# Patient Record
Sex: Female | Born: 1975 | Race: White | Hispanic: No | Smoking: Former smoker
Health system: Southern US, Community
[De-identification: ages and names within clinical notes are randomized; demographics above are authoritative.]

## PROBLEM LIST (undated history)

## (undated) DIAGNOSIS — F419 Anxiety disorder, unspecified: Secondary | ICD-10-CM

## (undated) DIAGNOSIS — E7800 Pure hypercholesterolemia, unspecified: Secondary | ICD-10-CM

## (undated) DIAGNOSIS — F32A Depression, unspecified: Secondary | ICD-10-CM

## (undated) DIAGNOSIS — L853 Xerosis cutis: Secondary | ICD-10-CM

## (undated) DIAGNOSIS — F319 Bipolar disorder, unspecified: Secondary | ICD-10-CM

## (undated) DIAGNOSIS — N3941 Urge incontinence: Secondary | ICD-10-CM

## (undated) DIAGNOSIS — E559 Vitamin D deficiency, unspecified: Secondary | ICD-10-CM

## (undated) DIAGNOSIS — Z973 Presence of spectacles and contact lenses: Secondary | ICD-10-CM

## (undated) DIAGNOSIS — R3914 Feeling of incomplete bladder emptying: Secondary | ICD-10-CM

## (undated) DIAGNOSIS — R351 Nocturia: Secondary | ICD-10-CM

## (undated) DIAGNOSIS — F329 Major depressive disorder, single episode, unspecified: Secondary | ICD-10-CM

## (undated) DIAGNOSIS — K219 Gastro-esophageal reflux disease without esophagitis: Secondary | ICD-10-CM

## (undated) DIAGNOSIS — G4730 Sleep apnea, unspecified: Secondary | ICD-10-CM

## (undated) HISTORY — DX: Anxiety disorder, unspecified: F41.9

## (undated) HISTORY — DX: Pure hypercholesterolemia, unspecified: E78.00

## (undated) HISTORY — DX: Sleep apnea, unspecified: G47.30

## (undated) HISTORY — PX: WISDOM TOOTH EXTRACTION: SHX21

## (undated) HISTORY — DX: Vitamin D deficiency, unspecified: E55.9

---

## 1998-06-20 ENCOUNTER — Emergency Department (HOSPITAL_COMMUNITY): Admission: EM | Admit: 1998-06-20 | Discharge: 1998-06-20 | Payer: Self-pay | Admitting: Emergency Medicine

## 1998-06-20 ENCOUNTER — Encounter: Payer: Self-pay | Admitting: Emergency Medicine

## 1998-12-12 ENCOUNTER — Other Ambulatory Visit: Admission: RE | Admit: 1998-12-12 | Discharge: 1998-12-12 | Payer: Self-pay | Admitting: Family Medicine

## 1999-05-25 ENCOUNTER — Other Ambulatory Visit: Admission: RE | Admit: 1999-05-25 | Discharge: 1999-05-25 | Payer: Self-pay | Admitting: Family Medicine

## 2000-11-04 ENCOUNTER — Other Ambulatory Visit: Admission: RE | Admit: 2000-11-04 | Discharge: 2000-11-04 | Payer: Self-pay | Admitting: *Deleted

## 2001-11-10 ENCOUNTER — Other Ambulatory Visit: Admission: RE | Admit: 2001-11-10 | Discharge: 2001-11-10 | Payer: Self-pay | Admitting: Obstetrics and Gynecology

## 2002-01-22 ENCOUNTER — Emergency Department (HOSPITAL_COMMUNITY): Admission: EM | Admit: 2002-01-22 | Discharge: 2002-01-22 | Payer: Self-pay | Admitting: Emergency Medicine

## 2002-04-20 ENCOUNTER — Encounter: Payer: Self-pay | Admitting: Obstetrics & Gynecology

## 2002-04-20 ENCOUNTER — Ambulatory Visit (HOSPITAL_COMMUNITY): Admission: RE | Admit: 2002-04-20 | Discharge: 2002-04-20 | Payer: Self-pay | Admitting: Obstetrics & Gynecology

## 2002-04-24 ENCOUNTER — Inpatient Hospital Stay (HOSPITAL_COMMUNITY): Admission: AD | Admit: 2002-04-24 | Discharge: 2002-04-24 | Payer: Self-pay | Admitting: *Deleted

## 2002-07-12 ENCOUNTER — Ambulatory Visit (HOSPITAL_COMMUNITY): Admission: RE | Admit: 2002-07-12 | Discharge: 2002-07-12 | Payer: Self-pay | Admitting: Obstetrics & Gynecology

## 2002-07-12 ENCOUNTER — Encounter: Payer: Self-pay | Admitting: Obstetrics & Gynecology

## 2002-08-27 ENCOUNTER — Encounter: Payer: Self-pay | Admitting: Obstetrics & Gynecology

## 2002-08-27 ENCOUNTER — Ambulatory Visit (HOSPITAL_COMMUNITY): Admission: RE | Admit: 2002-08-27 | Discharge: 2002-08-27 | Payer: Self-pay | Admitting: Obstetrics & Gynecology

## 2002-09-03 ENCOUNTER — Inpatient Hospital Stay (HOSPITAL_COMMUNITY): Admission: AD | Admit: 2002-09-03 | Discharge: 2002-09-03 | Payer: Self-pay | Admitting: Obstetrics

## 2002-10-17 ENCOUNTER — Inpatient Hospital Stay (HOSPITAL_COMMUNITY): Admission: AD | Admit: 2002-10-17 | Discharge: 2002-10-17 | Payer: Self-pay | Admitting: Certified Nurse Midwife

## 2002-10-23 ENCOUNTER — Inpatient Hospital Stay (HOSPITAL_COMMUNITY): Admission: AD | Admit: 2002-10-23 | Discharge: 2002-10-23 | Payer: Self-pay | Admitting: Obstetrics & Gynecology

## 2002-10-27 ENCOUNTER — Inpatient Hospital Stay (HOSPITAL_COMMUNITY): Admission: AD | Admit: 2002-10-27 | Discharge: 2002-10-27 | Payer: Self-pay | Admitting: Obstetrics

## 2002-10-28 ENCOUNTER — Encounter: Payer: Self-pay | Admitting: Obstetrics

## 2002-10-28 ENCOUNTER — Inpatient Hospital Stay (HOSPITAL_COMMUNITY): Admission: AD | Admit: 2002-10-28 | Discharge: 2002-10-28 | Payer: Self-pay | Admitting: Obstetrics

## 2002-11-13 ENCOUNTER — Inpatient Hospital Stay (HOSPITAL_COMMUNITY): Admission: AD | Admit: 2002-11-13 | Discharge: 2002-11-13 | Payer: Self-pay | Admitting: Obstetrics & Gynecology

## 2002-11-17 ENCOUNTER — Inpatient Hospital Stay (HOSPITAL_COMMUNITY): Admission: AD | Admit: 2002-11-17 | Discharge: 2002-11-17 | Payer: Self-pay | Admitting: Obstetrics

## 2002-11-26 ENCOUNTER — Encounter: Payer: Self-pay | Admitting: Obstetrics & Gynecology

## 2002-11-26 ENCOUNTER — Inpatient Hospital Stay (HOSPITAL_COMMUNITY): Admission: AD | Admit: 2002-11-26 | Discharge: 2002-11-26 | Payer: Self-pay | Admitting: Obstetrics & Gynecology

## 2002-11-30 ENCOUNTER — Inpatient Hospital Stay (HOSPITAL_COMMUNITY): Admission: AD | Admit: 2002-11-30 | Discharge: 2002-12-03 | Payer: Self-pay | Admitting: Obstetrics & Gynecology

## 2004-05-12 ENCOUNTER — Ambulatory Visit: Payer: Self-pay | Admitting: Family Medicine

## 2004-06-11 ENCOUNTER — Ambulatory Visit: Payer: Self-pay | Admitting: Family Medicine

## 2004-07-15 ENCOUNTER — Ambulatory Visit: Payer: Self-pay | Admitting: Family Medicine

## 2004-07-16 ENCOUNTER — Ambulatory Visit: Payer: Self-pay | Admitting: Family Medicine

## 2004-08-05 ENCOUNTER — Ambulatory Visit: Payer: Self-pay | Admitting: Family Medicine

## 2004-10-07 ENCOUNTER — Ambulatory Visit: Payer: Self-pay | Admitting: Family Medicine

## 2004-10-21 ENCOUNTER — Ambulatory Visit: Payer: Self-pay | Admitting: Family Medicine

## 2005-01-11 ENCOUNTER — Ambulatory Visit: Payer: Self-pay | Admitting: Family Medicine

## 2005-04-08 ENCOUNTER — Ambulatory Visit: Payer: Self-pay | Admitting: Family Medicine

## 2005-07-21 ENCOUNTER — Ambulatory Visit: Payer: Self-pay | Admitting: Family Medicine

## 2006-01-05 ENCOUNTER — Encounter (INDEPENDENT_AMBULATORY_CARE_PROVIDER_SITE_OTHER): Payer: Self-pay | Admitting: *Deleted

## 2006-01-05 LAB — CONVERTED CEMR LAB

## 2006-01-28 ENCOUNTER — Ambulatory Visit: Payer: Self-pay | Admitting: Family Medicine

## 2006-02-03 ENCOUNTER — Ambulatory Visit: Payer: Self-pay | Admitting: Family Medicine

## 2006-06-22 ENCOUNTER — Ambulatory Visit: Payer: Self-pay | Admitting: Sports Medicine

## 2006-08-04 DIAGNOSIS — F319 Bipolar disorder, unspecified: Secondary | ICD-10-CM | POA: Insufficient documentation

## 2006-08-04 DIAGNOSIS — L219 Seborrheic dermatitis, unspecified: Secondary | ICD-10-CM | POA: Insufficient documentation

## 2006-08-05 ENCOUNTER — Encounter (INDEPENDENT_AMBULATORY_CARE_PROVIDER_SITE_OTHER): Payer: Self-pay | Admitting: *Deleted

## 2006-08-25 ENCOUNTER — Telehealth: Payer: Self-pay | Admitting: *Deleted

## 2006-11-10 ENCOUNTER — Telehealth: Payer: Self-pay | Admitting: *Deleted

## 2006-11-11 ENCOUNTER — Ambulatory Visit: Payer: Self-pay | Admitting: Sports Medicine

## 2006-11-11 ENCOUNTER — Encounter (INDEPENDENT_AMBULATORY_CARE_PROVIDER_SITE_OTHER): Payer: Self-pay | Admitting: Family Medicine

## 2006-11-11 LAB — CONVERTED CEMR LAB
Beta hcg, urine, semiquantitative: NEGATIVE
Bilirubin Urine: NEGATIVE
Blood in Urine, dipstick: NEGATIVE
CO2: 22 meq/L (ref 19–32)
Chloride: 105 meq/L (ref 96–112)
Creatinine, Ser: 0.89 mg/dL (ref 0.40–1.20)
Glucose, Urine, Semiquant: NEGATIVE
Nitrite: NEGATIVE
Protein, U semiquant: NEGATIVE
Specific Gravity, Urine: 1.02
Urobilinogen, UA: 0.2
WBC, UA: >20 cells/hpf
Whiff Test: NEGATIVE
pH: 5.5

## 2006-11-25 ENCOUNTER — Ambulatory Visit: Payer: Self-pay | Admitting: Family Medicine

## 2006-11-25 DIAGNOSIS — N3946 Mixed incontinence: Secondary | ICD-10-CM

## 2006-11-25 LAB — CONVERTED CEMR LAB
Ketones, urine, test strip: NEGATIVE
Nitrite: NEGATIVE
Urobilinogen, UA: 0.2

## 2006-11-29 ENCOUNTER — Telehealth: Payer: Self-pay | Admitting: *Deleted

## 2006-12-05 ENCOUNTER — Telehealth: Payer: Self-pay | Admitting: *Deleted

## 2006-12-07 ENCOUNTER — Ambulatory Visit: Payer: Self-pay | Admitting: Sports Medicine

## 2006-12-07 LAB — CONVERTED CEMR LAB
Bilirubin Urine: NEGATIVE
Glucose, Urine, Semiquant: NEGATIVE
Protein, U semiquant: NEGATIVE
pH: 6.5

## 2007-01-03 ENCOUNTER — Other Ambulatory Visit: Admission: RE | Admit: 2007-01-03 | Discharge: 2007-01-03 | Payer: Self-pay | Admitting: Family Medicine

## 2007-01-03 ENCOUNTER — Encounter (INDEPENDENT_AMBULATORY_CARE_PROVIDER_SITE_OTHER): Payer: Self-pay | Admitting: Family Medicine

## 2007-01-03 ENCOUNTER — Ambulatory Visit: Payer: Self-pay | Admitting: Family Medicine

## 2007-01-03 ENCOUNTER — Telehealth (INDEPENDENT_AMBULATORY_CARE_PROVIDER_SITE_OTHER): Payer: Self-pay | Admitting: *Deleted

## 2007-01-03 DIAGNOSIS — E785 Hyperlipidemia, unspecified: Secondary | ICD-10-CM | POA: Insufficient documentation

## 2007-01-09 ENCOUNTER — Telehealth: Payer: Self-pay | Admitting: *Deleted

## 2007-01-16 LAB — CONVERTED CEMR LAB
Chlamydia, DNA Probe: NEGATIVE
Cholesterol: 228 mg/dL — ABNORMAL HIGH (ref 0–200)
GC Probe Amp, Genital: NEGATIVE
HDL: 46 mg/dL (ref >39)
Triglycerides: 183 mg/dL — ABNORMAL HIGH (ref <150)

## 2007-01-17 ENCOUNTER — Telehealth (INDEPENDENT_AMBULATORY_CARE_PROVIDER_SITE_OTHER): Payer: Self-pay | Admitting: *Deleted

## 2007-01-17 ENCOUNTER — Ambulatory Visit: Payer: Self-pay | Admitting: Family Medicine

## 2007-01-18 ENCOUNTER — Telehealth: Payer: Self-pay | Admitting: *Deleted

## 2007-02-08 ENCOUNTER — Telehealth (INDEPENDENT_AMBULATORY_CARE_PROVIDER_SITE_OTHER): Payer: Self-pay | Admitting: *Deleted

## 2007-02-22 ENCOUNTER — Ambulatory Visit: Payer: Self-pay | Admitting: Family Medicine

## 2007-02-22 DIAGNOSIS — E669 Obesity, unspecified: Secondary | ICD-10-CM | POA: Insufficient documentation

## 2007-02-22 LAB — CONVERTED CEMR LAB
Nitrite: NEGATIVE
pH: 6

## 2007-03-30 ENCOUNTER — Telehealth (INDEPENDENT_AMBULATORY_CARE_PROVIDER_SITE_OTHER): Payer: Self-pay | Admitting: Family Medicine

## 2007-04-06 ENCOUNTER — Ambulatory Visit: Payer: Self-pay | Admitting: Family Medicine

## 2007-04-06 LAB — CONVERTED CEMR LAB
Bilirubin Urine: NEGATIVE
Ketones, urine, test strip: NEGATIVE
Nitrite: NEGATIVE
Specific Gravity, Urine: 1.015

## 2007-05-23 ENCOUNTER — Encounter (INDEPENDENT_AMBULATORY_CARE_PROVIDER_SITE_OTHER): Payer: Self-pay | Admitting: Family Medicine

## 2007-07-03 ENCOUNTER — Ambulatory Visit: Payer: Self-pay | Admitting: Family Medicine

## 2007-07-04 ENCOUNTER — Telehealth: Payer: Self-pay | Admitting: Family Medicine

## 2007-07-14 ENCOUNTER — Telehealth (INDEPENDENT_AMBULATORY_CARE_PROVIDER_SITE_OTHER): Payer: Self-pay | Admitting: Family Medicine

## 2007-07-17 ENCOUNTER — Encounter: Payer: Self-pay | Admitting: *Deleted

## 2007-07-25 ENCOUNTER — Ambulatory Visit: Payer: Self-pay | Admitting: Family Medicine

## 2007-07-28 ENCOUNTER — Telehealth (INDEPENDENT_AMBULATORY_CARE_PROVIDER_SITE_OTHER): Payer: Self-pay | Admitting: Family Medicine

## 2007-07-31 ENCOUNTER — Telehealth: Payer: Self-pay | Admitting: *Deleted

## 2007-08-29 ENCOUNTER — Encounter (INDEPENDENT_AMBULATORY_CARE_PROVIDER_SITE_OTHER): Payer: Self-pay | Admitting: Family Medicine

## 2007-08-29 ENCOUNTER — Telehealth (INDEPENDENT_AMBULATORY_CARE_PROVIDER_SITE_OTHER): Payer: Self-pay | Admitting: Family Medicine

## 2007-09-04 ENCOUNTER — Encounter (INDEPENDENT_AMBULATORY_CARE_PROVIDER_SITE_OTHER): Payer: Self-pay | Admitting: Family Medicine

## 2007-09-20 ENCOUNTER — Telehealth (INDEPENDENT_AMBULATORY_CARE_PROVIDER_SITE_OTHER): Payer: Self-pay | Admitting: *Deleted

## 2007-09-21 ENCOUNTER — Telehealth: Payer: Self-pay | Admitting: Psychology

## 2007-09-22 ENCOUNTER — Telehealth: Payer: Self-pay | Admitting: Psychology

## 2007-09-29 ENCOUNTER — Telehealth: Payer: Self-pay | Admitting: *Deleted

## 2007-09-29 ENCOUNTER — Emergency Department (HOSPITAL_COMMUNITY): Admission: EM | Admit: 2007-09-29 | Discharge: 2007-09-29 | Payer: Self-pay | Admitting: Family Medicine

## 2007-10-10 ENCOUNTER — Ambulatory Visit: Payer: Self-pay | Admitting: Family Medicine

## 2007-10-10 DIAGNOSIS — M545 Low back pain: Secondary | ICD-10-CM

## 2007-10-10 DIAGNOSIS — B009 Herpesviral infection, unspecified: Secondary | ICD-10-CM | POA: Insufficient documentation

## 2007-10-10 DIAGNOSIS — M549 Dorsalgia, unspecified: Secondary | ICD-10-CM | POA: Insufficient documentation

## 2007-10-10 HISTORY — DX: Herpesviral infection, unspecified: B00.9

## 2007-11-06 ENCOUNTER — Telehealth: Payer: Self-pay | Admitting: *Deleted

## 2007-11-16 ENCOUNTER — Ambulatory Visit: Payer: Self-pay | Admitting: Family Medicine

## 2007-11-16 ENCOUNTER — Encounter (INDEPENDENT_AMBULATORY_CARE_PROVIDER_SITE_OTHER): Payer: Self-pay | Admitting: Family Medicine

## 2007-11-17 ENCOUNTER — Telehealth (INDEPENDENT_AMBULATORY_CARE_PROVIDER_SITE_OTHER): Payer: Self-pay | Admitting: *Deleted

## 2007-11-29 ENCOUNTER — Telehealth (INDEPENDENT_AMBULATORY_CARE_PROVIDER_SITE_OTHER): Payer: Self-pay | Admitting: *Deleted

## 2007-11-29 LAB — CONVERTED CEMR LAB
FSH: 6.5 milliintl units/mL
Prolactin: 4.8 ng/mL
TSH: 1.757 microintl units/mL (ref 0.350–5.50)
hCG, Beta Chain, Quant, S: <2 milliintl units/mL

## 2007-12-04 ENCOUNTER — Telehealth (INDEPENDENT_AMBULATORY_CARE_PROVIDER_SITE_OTHER): Payer: Self-pay | Admitting: Family Medicine

## 2007-12-06 ENCOUNTER — Telehealth: Payer: Self-pay | Admitting: Family Medicine

## 2007-12-07 ENCOUNTER — Telehealth: Payer: Self-pay | Admitting: *Deleted

## 2007-12-07 ENCOUNTER — Ambulatory Visit: Payer: Self-pay | Admitting: Family Medicine

## 2007-12-07 ENCOUNTER — Telehealth: Payer: Self-pay | Admitting: Family Medicine

## 2007-12-19 ENCOUNTER — Ambulatory Visit: Payer: Self-pay | Admitting: Family Medicine

## 2007-12-19 ENCOUNTER — Encounter (INDEPENDENT_AMBULATORY_CARE_PROVIDER_SITE_OTHER): Payer: Self-pay | Admitting: Family Medicine

## 2007-12-20 LAB — CONVERTED CEMR LAB
Estradiol: 16.7 pg/mL
HCT: 45.2 % (ref 36.0–46.0)
Hemoglobin: 13.9 g/dL (ref 12.0–15.0)
MCHC: 30.8 g/dL (ref 30.0–36.0)
MCV: 93.2 fL (ref 78.0–100.0)
RDW: 13.3 % (ref 11.5–15.5)
TIBC: 306 ug/dL (ref 250–470)

## 2007-12-21 ENCOUNTER — Telehealth: Payer: Self-pay | Admitting: *Deleted

## 2007-12-22 ENCOUNTER — Ambulatory Visit: Payer: Self-pay | Admitting: Family Medicine

## 2008-01-01 ENCOUNTER — Telehealth (INDEPENDENT_AMBULATORY_CARE_PROVIDER_SITE_OTHER): Payer: Self-pay | Admitting: Family Medicine

## 2008-01-04 ENCOUNTER — Telehealth: Payer: Self-pay | Admitting: *Deleted

## 2008-01-08 ENCOUNTER — Ambulatory Visit: Payer: Self-pay | Admitting: Family Medicine

## 2008-01-09 ENCOUNTER — Encounter (INDEPENDENT_AMBULATORY_CARE_PROVIDER_SITE_OTHER): Payer: Self-pay | Admitting: Family Medicine

## 2008-01-09 ENCOUNTER — Telehealth (INDEPENDENT_AMBULATORY_CARE_PROVIDER_SITE_OTHER): Payer: Self-pay | Admitting: *Deleted

## 2008-01-14 ENCOUNTER — Telehealth (INDEPENDENT_AMBULATORY_CARE_PROVIDER_SITE_OTHER): Payer: Self-pay | Admitting: Family Medicine

## 2008-01-16 ENCOUNTER — Telehealth (INDEPENDENT_AMBULATORY_CARE_PROVIDER_SITE_OTHER): Payer: Self-pay | Admitting: *Deleted

## 2008-01-22 ENCOUNTER — Encounter: Payer: Self-pay | Admitting: *Deleted

## 2008-01-24 ENCOUNTER — Telehealth (INDEPENDENT_AMBULATORY_CARE_PROVIDER_SITE_OTHER): Payer: Self-pay | Admitting: Family Medicine

## 2008-02-27 ENCOUNTER — Telehealth (INDEPENDENT_AMBULATORY_CARE_PROVIDER_SITE_OTHER): Payer: Self-pay | Admitting: *Deleted

## 2008-02-29 ENCOUNTER — Ambulatory Visit: Payer: Self-pay | Admitting: Obstetrics & Gynecology

## 2008-02-29 ENCOUNTER — Encounter (INDEPENDENT_AMBULATORY_CARE_PROVIDER_SITE_OTHER): Payer: Self-pay | Admitting: Family Medicine

## 2008-03-04 ENCOUNTER — Telehealth: Payer: Self-pay | Admitting: *Deleted

## 2008-03-07 ENCOUNTER — Telehealth: Payer: Self-pay | Admitting: Psychology

## 2008-03-11 ENCOUNTER — Telehealth: Payer: Self-pay | Admitting: *Deleted

## 2008-03-12 ENCOUNTER — Telehealth: Payer: Self-pay | Admitting: *Deleted

## 2008-03-22 ENCOUNTER — Ambulatory Visit: Payer: Self-pay | Admitting: Family Medicine

## 2008-03-27 ENCOUNTER — Telehealth (INDEPENDENT_AMBULATORY_CARE_PROVIDER_SITE_OTHER): Payer: Self-pay | Admitting: *Deleted

## 2008-03-27 ENCOUNTER — Telehealth: Payer: Self-pay | Admitting: *Deleted

## 2008-04-08 ENCOUNTER — Encounter (INDEPENDENT_AMBULATORY_CARE_PROVIDER_SITE_OTHER): Payer: Self-pay | Admitting: *Deleted

## 2008-04-08 ENCOUNTER — Encounter: Payer: Self-pay | Admitting: *Deleted

## 2008-05-28 ENCOUNTER — Telehealth: Payer: Self-pay | Admitting: *Deleted

## 2008-06-14 ENCOUNTER — Ambulatory Visit: Payer: Self-pay | Admitting: Family Medicine

## 2008-06-24 ENCOUNTER — Telehealth (INDEPENDENT_AMBULATORY_CARE_PROVIDER_SITE_OTHER): Payer: Self-pay | Admitting: *Deleted

## 2008-07-02 ENCOUNTER — Encounter (INDEPENDENT_AMBULATORY_CARE_PROVIDER_SITE_OTHER): Payer: Self-pay | Admitting: Family Medicine

## 2008-07-02 ENCOUNTER — Ambulatory Visit: Payer: Self-pay | Admitting: Family Medicine

## 2008-07-02 LAB — CONVERTED CEMR LAB
Chlamydia, DNA Probe: NEGATIVE
GC Probe Amp, Genital: NEGATIVE
Whiff Test: NEGATIVE

## 2008-07-03 ENCOUNTER — Encounter (INDEPENDENT_AMBULATORY_CARE_PROVIDER_SITE_OTHER): Payer: Self-pay | Admitting: Family Medicine

## 2008-08-20 ENCOUNTER — Telehealth (INDEPENDENT_AMBULATORY_CARE_PROVIDER_SITE_OTHER): Payer: Self-pay | Admitting: Family Medicine

## 2008-11-12 ENCOUNTER — Encounter (INDEPENDENT_AMBULATORY_CARE_PROVIDER_SITE_OTHER): Payer: Self-pay | Admitting: Family Medicine

## 2008-11-14 ENCOUNTER — Telehealth: Payer: Self-pay | Admitting: Family Medicine

## 2008-12-04 ENCOUNTER — Ambulatory Visit: Payer: Self-pay | Admitting: Family Medicine

## 2008-12-08 ENCOUNTER — Telehealth: Payer: Self-pay | Admitting: Family Medicine

## 2008-12-09 ENCOUNTER — Telehealth: Payer: Self-pay | Admitting: Family Medicine

## 2008-12-10 ENCOUNTER — Telehealth: Payer: Self-pay | Admitting: Family Medicine

## 2008-12-11 ENCOUNTER — Encounter: Payer: Self-pay | Admitting: Family Medicine

## 2008-12-11 ENCOUNTER — Ambulatory Visit: Payer: Self-pay | Admitting: Family Medicine

## 2008-12-11 DIAGNOSIS — G4700 Insomnia, unspecified: Secondary | ICD-10-CM

## 2008-12-11 LAB — CONVERTED CEMR LAB
Antibody Screen: NEGATIVE
Basophils Relative: 0 % (ref 0–1)
Eosinophils Absolute: 0.3 10*3/uL (ref 0.0–0.7)
Eosinophils Relative: 2 % (ref 0–5)
HCT: 39.7 % (ref 36.0–46.0)
MCHC: 32.7 g/dL (ref 30.0–36.0)
MCV: 88.2 fL (ref 78.0–100.0)
Neutrophils Relative %: 73 % (ref 43–77)
Platelets: 268 10*3/uL (ref 150–400)
RDW: 12.9 % (ref 11.5–15.5)
Rubella: 97.4 intl units/mL — ABNORMAL HIGH
Sickle Cell Screen: NEGATIVE

## 2008-12-12 ENCOUNTER — Telehealth: Payer: Self-pay | Admitting: Family Medicine

## 2008-12-12 ENCOUNTER — Ambulatory Visit (HOSPITAL_COMMUNITY): Admission: RE | Admit: 2008-12-12 | Discharge: 2008-12-12 | Payer: Self-pay | Admitting: Internal Medicine

## 2008-12-12 ENCOUNTER — Encounter: Payer: Self-pay | Admitting: Family Medicine

## 2008-12-12 DIAGNOSIS — O09A0 Supervision of pregnancy with history of molar pregnancy, unspecified trimester: Secondary | ICD-10-CM | POA: Insufficient documentation

## 2008-12-12 DIAGNOSIS — O0289 Other abnormal products of conception: Secondary | ICD-10-CM

## 2008-12-12 HISTORY — DX: Supervision of pregnancy with history of molar pregnancy, unspecified trimester: O09.A0

## 2008-12-13 ENCOUNTER — Encounter: Payer: Self-pay | Admitting: Family Medicine

## 2008-12-13 ENCOUNTER — Ambulatory Visit: Payer: Self-pay | Admitting: Family Medicine

## 2008-12-13 ENCOUNTER — Telehealth: Payer: Self-pay | Admitting: Family Medicine

## 2008-12-16 ENCOUNTER — Encounter: Payer: Self-pay | Admitting: Family Medicine

## 2008-12-16 ENCOUNTER — Telehealth: Payer: Self-pay | Admitting: Family Medicine

## 2008-12-16 ENCOUNTER — Ambulatory Visit: Payer: Self-pay | Admitting: Family Medicine

## 2008-12-17 LAB — CONVERTED CEMR LAB: hCG, Beta Chain, Quant, S: 61701.4 milliintl units/mL

## 2008-12-19 ENCOUNTER — Encounter: Payer: Self-pay | Admitting: Family Medicine

## 2008-12-19 ENCOUNTER — Telehealth: Payer: Self-pay | Admitting: Family Medicine

## 2008-12-19 ENCOUNTER — Ambulatory Visit (HOSPITAL_COMMUNITY): Admission: RE | Admit: 2008-12-19 | Discharge: 2008-12-19 | Payer: Self-pay | Admitting: Family Medicine

## 2008-12-20 ENCOUNTER — Telehealth: Payer: Self-pay | Admitting: Family Medicine

## 2008-12-20 ENCOUNTER — Encounter: Payer: Self-pay | Admitting: Family Medicine

## 2008-12-23 ENCOUNTER — Telehealth: Payer: Self-pay | Admitting: Family Medicine

## 2008-12-23 ENCOUNTER — Encounter: Payer: Self-pay | Admitting: Family Medicine

## 2008-12-25 ENCOUNTER — Ambulatory Visit: Payer: Self-pay | Admitting: Obstetrics & Gynecology

## 2008-12-26 ENCOUNTER — Ambulatory Visit (HOSPITAL_COMMUNITY): Admission: RE | Admit: 2008-12-26 | Discharge: 2008-12-26 | Payer: Self-pay | Admitting: Obstetrics & Gynecology

## 2008-12-26 ENCOUNTER — Encounter: Payer: Self-pay | Admitting: Obstetrics & Gynecology

## 2008-12-26 ENCOUNTER — Ambulatory Visit: Payer: Self-pay | Admitting: Obstetrics & Gynecology

## 2008-12-26 HISTORY — PX: DILATION AND EVACUATION: SHX1459

## 2009-01-01 ENCOUNTER — Ambulatory Visit: Payer: Self-pay | Admitting: Obstetrics and Gynecology

## 2009-01-01 ENCOUNTER — Encounter (INDEPENDENT_AMBULATORY_CARE_PROVIDER_SITE_OTHER): Payer: Self-pay | Admitting: Obstetrics & Gynecology

## 2009-01-01 LAB — CONVERTED CEMR LAB: hCG, Beta Chain, Quant, S: 295.8 milliintl units/mL

## 2009-01-03 ENCOUNTER — Telehealth: Payer: Self-pay | Admitting: *Deleted

## 2009-01-03 ENCOUNTER — Telehealth: Payer: Self-pay | Admitting: Family Medicine

## 2009-01-08 ENCOUNTER — Ambulatory Visit: Payer: Self-pay | Admitting: Obstetrics & Gynecology

## 2009-01-08 ENCOUNTER — Encounter: Payer: Self-pay | Admitting: Obstetrics & Gynecology

## 2009-01-08 LAB — CONVERTED CEMR LAB: hCG, Beta Chain, Quant, S: 61.2 milliintl units/mL

## 2009-01-16 ENCOUNTER — Ambulatory Visit: Payer: Self-pay | Admitting: Family Medicine

## 2009-01-16 ENCOUNTER — Encounter: Payer: Self-pay | Admitting: Family Medicine

## 2009-01-16 LAB — CONVERTED CEMR LAB
CO2: 28 meq/L (ref 19–32)
Calcium: 9.9 mg/dL (ref 8.4–10.5)
Creatinine, Ser: 0.79 mg/dL (ref 0.40–1.20)
Glucose, Bld: 103 mg/dL — ABNORMAL HIGH (ref 70–99)
HCT: 37.8 % (ref 36.0–46.0)
MCHC: 32.3 g/dL (ref 30.0–36.0)
MCV: 88.1 fL (ref 78.0–100.0)
RBC: 4.29 M/uL (ref 3.87–5.11)
Sodium: 142 meq/L (ref 135–145)
WBC: 7.9 10*3/uL (ref 4.0–10.5)
hCG, Beta Chain, Quant, S: 20.5 milliintl units/mL

## 2009-01-17 ENCOUNTER — Telehealth: Payer: Self-pay | Admitting: Family Medicine

## 2009-01-23 ENCOUNTER — Ambulatory Visit: Payer: Self-pay | Admitting: Family Medicine

## 2009-01-23 DIAGNOSIS — IMO0002 Reserved for concepts with insufficient information to code with codable children: Secondary | ICD-10-CM | POA: Insufficient documentation

## 2009-01-23 DIAGNOSIS — R413 Other amnesia: Secondary | ICD-10-CM | POA: Insufficient documentation

## 2009-01-29 ENCOUNTER — Ambulatory Visit: Payer: Self-pay | Admitting: Obstetrics and Gynecology

## 2009-02-05 ENCOUNTER — Ambulatory Visit: Payer: Self-pay | Admitting: Obstetrics & Gynecology

## 2009-02-05 ENCOUNTER — Encounter: Payer: Self-pay | Admitting: Obstetrics and Gynecology

## 2009-02-12 ENCOUNTER — Ambulatory Visit: Payer: Self-pay | Admitting: Obstetrics and Gynecology

## 2009-03-26 ENCOUNTER — Encounter: Payer: Self-pay | Admitting: Family Medicine

## 2009-03-26 ENCOUNTER — Ambulatory Visit: Payer: Self-pay | Admitting: Family Medicine

## 2009-04-03 ENCOUNTER — Encounter: Payer: Self-pay | Admitting: Family Medicine

## 2009-04-16 ENCOUNTER — Encounter: Payer: Self-pay | Admitting: Family Medicine

## 2009-04-16 ENCOUNTER — Ambulatory Visit: Payer: Self-pay | Admitting: Family Medicine

## 2009-04-23 ENCOUNTER — Telehealth: Payer: Self-pay | Admitting: Family Medicine

## 2009-04-24 ENCOUNTER — Encounter: Payer: Self-pay | Admitting: Family Medicine

## 2009-04-24 ENCOUNTER — Ambulatory Visit: Payer: Self-pay | Admitting: Family Medicine

## 2009-04-24 LAB — CONVERTED CEMR LAB
Bilirubin Urine: NEGATIVE
Blood in Urine, dipstick: NEGATIVE
CO2: 24 meq/L (ref 19–32)
Calcium: 9.4 mg/dL (ref 8.4–10.5)
Chloride: 105 meq/L (ref 96–112)
Glucose, Bld: 69 mg/dL — ABNORMAL LOW (ref 70–99)
Glucose, Urine, Semiquant: NEGATIVE
Ketones, urine, test strip: NEGATIVE
Potassium: 4 meq/L (ref 3.5–5.3)
Protein, U semiquant: NEGATIVE
Sodium: 140 meq/L (ref 135–145)
Urobilinogen, UA: 0.2
pH: 6

## 2009-04-25 ENCOUNTER — Encounter: Payer: Self-pay | Admitting: Family Medicine

## 2009-04-30 ENCOUNTER — Telehealth: Payer: Self-pay | Admitting: Family Medicine

## 2009-05-07 ENCOUNTER — Telehealth: Payer: Self-pay | Admitting: Family Medicine

## 2009-05-09 ENCOUNTER — Encounter: Payer: Self-pay | Admitting: Family Medicine

## 2009-05-12 ENCOUNTER — Telehealth: Payer: Self-pay | Admitting: Family Medicine

## 2009-05-26 ENCOUNTER — Telehealth: Payer: Self-pay | Admitting: Family Medicine

## 2009-05-28 ENCOUNTER — Ambulatory Visit: Payer: Self-pay | Admitting: Family Medicine

## 2009-06-01 ENCOUNTER — Telehealth: Payer: Self-pay | Admitting: Family Medicine

## 2009-06-09 ENCOUNTER — Encounter (INDEPENDENT_AMBULATORY_CARE_PROVIDER_SITE_OTHER): Payer: Self-pay | Admitting: *Deleted

## 2009-06-09 DIAGNOSIS — Z87891 Personal history of nicotine dependence: Secondary | ICD-10-CM

## 2009-06-09 DIAGNOSIS — F17200 Nicotine dependence, unspecified, uncomplicated: Secondary | ICD-10-CM

## 2009-06-09 HISTORY — DX: Personal history of nicotine dependence: Z87.891

## 2009-09-15 ENCOUNTER — Telehealth: Payer: Self-pay | Admitting: Family Medicine

## 2009-11-12 ENCOUNTER — Emergency Department (HOSPITAL_COMMUNITY): Admission: EM | Admit: 2009-11-12 | Discharge: 2009-11-12 | Payer: Self-pay | Admitting: Emergency Medicine

## 2009-11-21 ENCOUNTER — Encounter: Payer: Self-pay | Admitting: Family Medicine

## 2010-02-05 HISTORY — PX: INTRAUTERINE DEVICE (IUD) INSERTION: SHX5877

## 2010-07-07 NOTE — Miscellaneous (Signed)
Summary: Tobacco [employer]  Clinical Lists Changes  Problems: Added new problem of TOBACCO Enrico Eaddy (ICD-305.1) 

## 2010-07-07 NOTE — Progress Notes (Signed)
  Phone Note Call from Patient   Summary of Call: Patient called with question "am I pregnant?" When told that she was pregnant according to her last Upreg, she said, "good, I thought that I was dreaming." She denied any problems including cramping or bleeding. She asked when she would be able to hear the heart beat. I explained that this could be done 10-13 weeks. Advised patient to make and keep OB appointment to go over other questions.

## 2010-07-07 NOTE — Miscellaneous (Signed)
  Clinical Lists Changes  Problems: Removed problem of HYPOTENSION (ICD-458.9) Removed problem of FLANK PAIN (ICD-789.09) Removed problem of INSERTION OF INTRAUTERINE CONTRACEPTIVE DEVICE (ICD-V25.1) Removed problem of FAMILY PLANNING (ICD-V25.09) Removed problem of RECTAL PAIN (YNW-295.62)

## 2010-07-07 NOTE — Progress Notes (Signed)
Summary: Rx Req  Phone Note Refill Request Call back at [phone] Message from:  Patient  Refills Requested: Medication #1:  TRAZODONE HCL 100 MG TABS one tab by mouth at bedtime. Would like this to go to [facility] [address]  Did get it at [facility]. but now has new ins and is changing pharmacies.  Initial call taken by: Clydell Hakim,  September 15, 2009 9:04 AM  Follow-up for Phone Call        will forward to MD. Follow-up by: Theresia Lo RN,  September 15, 2009 9:10 AM    Prescriptions: TRAZODONE HCL 100 MG TABS (TRAZODONE HCL) one tab by mouth at bedtime  #30 x 3   Entered and Authorized by:   Lequita Asal  MD   Signed by:   Lequita Asal  MD on 09/16/2009   Method used:   Electronically to        [facility] [phone]* (retail)       [address]       [place], [place]  [address]       Ph: [phone]       Fax: [phone]   RxID:   [id number]  patient notified. Levonne Carreras RN  September 16, 2009 9:57 AM

## 2010-07-16 ENCOUNTER — Encounter: Payer: Self-pay | Admitting: *Deleted

## 2010-08-24 LAB — POCT I-STAT, CHEM 8
BUN: 12 mg/dL (ref 6–23)
Chloride: 102 mEq/L (ref 96–112)
Creatinine, Ser: 0.9 mg/dL (ref 0.4–1.2)
Glucose, Bld: 101 mg/dL — ABNORMAL HIGH (ref 70–99)
HCT: 35 % — ABNORMAL LOW (ref 36.0–46.0)
Potassium: 3.6 mEq/L (ref 3.5–5.1)

## 2010-09-13 LAB — CROSSMATCH: ABO/RH(D): A POS

## 2010-09-13 LAB — CBC
HCT: 36.7 % (ref 36.0–46.0)
Hemoglobin: 12.6 g/dL (ref 12.0–15.0)
MCV: 89 fL (ref 78.0–100.0)
RDW: 12.8 % (ref 11.5–15.5)

## 2010-10-20 NOTE — Op Note (Signed)
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   PROCEDURE:  Dilation and evacuation.   SURGEON:  Horton Chin, MD   ANESTHESIOLOGIST:  Dana Allan, MD   ANESTHESIA:  Monitored anesthesia care.   INDICATIONS:  The patient is a 35 year old, gravida 2, para 1, who was  followed with serial beta-hCGs and abnormal ultrasound findings.  Her  initial beta-hCG was noted to be 71,000 which went down to 61,000.  However, there was a 3-4 cm cystic structure in the endometrial cavity.  No fetal pole or yolk sac was seen giving concern for abnormal  pregnancy, likely molar pregnancy.  She was counseled regarding a  dilation and evacuation.  The risks of surgery including bleeding,  infection, injury to surrounding organs, need for additional procedures  were explained to the patient and written informed consent was obtained.   FINDINGS:  A 10-week size uterus, moderate amount of products of  conception.   SPECIMENS:  Products of conception sent to pathology.   ESTIMATED BLOOD LOSS:  50 mL.   COMPLICATIONS:  None immediate.   PROCEDURE DETAILS:  The patient was taken to the operating room where  monitored anesthesia care was administered and found to be adequate.  She was then placed in the dorsal lithotomy position, and prepped and  draped in a sterile manner.  The bladder was self catheterized for an  unmeasured amount of clear yellow urine.  After a time-out was  conducted, a vaginal speculum was placed and a  paracervical block using  1% Xylocaine with epinephrine was administered.  The cervix was noted to  be closed and was successfully dilated using metal dilators to  accommodate the 9-mm curved suction curette.  The curette was then  gently inserted into the uterine cavity and advanced to the fundus.  The  suction machine was activated and the curette was slowly rotated to  clear the uterus of all contents.  The uterus was noted to contract down  after evacuation of contents.  Complete evacuation was also verified by  sharp curettage that was done and the patient had minimal bleeding at  the end of the case. All instruments were then removed from the  patient's pelvis.  The patient tolerated the procedure well.  Sponge,  instrument, and needle counts were correct x3.  She was taken to the  recovery room in stable condition.  Of note, the patient will follow up  in the clinic on January 01, 2009 at 2:00 p.m. for a review of the  pathology results and for further management.      Horton Chin, MD  Electronically  Signed     UAA/MEDQ  D:  12/26/2008  T:  12/27/2008  Job:  [id number]

## 2010-10-20 NOTE — Group Therapy Note (Signed)
NAME:  Kimberly Browning, Kimberly Browning NO.:  [id number]   MEDICAL RECORD NO.:  [id number]          PATIENT TYPE:  WOC   LOCATION:  [facility]                   FACILITY:  [facility]   PHYSICIAN:  Dorthula Perfect, MD     DATE OF BIRTH:  Oct 30, 1975   DATE OF SERVICE:                                  CLINIC NOTE   A 35 year old female returns today for followup quantitative hCG.  See  the dictated note of January 01, 2009.  Her quantitative hCG done on January 01, 2009, was 295.8.   She had some very small lesions last time on her lower vulva, which I  did not culture.  I examined her vulva today and those little areas are  completely normal.   IMPRESSION:  1. Status post molar pregnancy with regressing hCG values.  2. I re-explained to her the importance of having the titers done on a      weekly basis until they are normal.  I also explained after that      they can be done monthly for least 6 months.  I also told her she      needs to refrain from getting pregnant for at least 6 months.  She      asked about getting Mirena IUD today, but I told her until these      values are normal and has been normal for a while that this should      not be utilized.  She seems to understand.           ______________________________  Dorthula Perfect, MD     ER/MEDQ  D:  01/08/2009  T:  01/09/2009  Job:  [id number]

## 2010-10-20 NOTE — Group Therapy Note (Signed)
NAME:  Kimberly Browning, Kimberly Browning NO.:  [id number]   MEDICAL RECORD NO.:  [id number]          PATIENT TYPE:  WOC   LOCATION:  [facility]                   [facility]ACILITY:  [facility]   PHYSICIAN:  Dorthula Perfect, MD     DATE OF BIRTH:  1976-03-29   DATE OF SERVICE:  01/01/2009                                  CLINIC NOTE   This 35 year old female returns today for followup having had a D and C  done for a molar pregnancy.  On December 16, 2008, she had a quantitative  HCG of 71,269.  On December 17, 2008, the value was 61,701.  Ultrasound had  shown cystic areas within the uterus.  She underwent a D and C on December 26, 2008.  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  This is her first checkup since then.   She says she is still having some slight bleeding.  She has not had  intercourse.   PHYSICAL EXAMINATION:  VITAL SIGNS:  Height 5 feet 4 inches, weight 126,  blood pressure 109/70.  ABDOMEN:  Soft and nontender.  PELVIC:  External genitalia, BUS and Skene glands are normal.  Vaginal  vault epithelialized.  The cervix is normal.  She is not bleeding.  Uterus is midline, normal size, and shape.  Adnexal structures are  normal.   IMPRESSION:  1. 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  2. History of abnormal Pap smears.   DISPOSITION:  1. Pap smear.  2. Quantitative beta HCG.  3. I explained, in some detail, the importance of having followup      blood tests done until the HCG value has returned to normal.  I      have also explained to her the importance of not having sexual      intercourse.  In the past, she has used birth control pills, which      she had bad reactions to.  I think she has some interest in Mirena      IUD on down the road.  I will try if the result is back tomorrow      when I am here, I will try to call her with the result.  She is to      return to be rechecked in a week.            ______________________________  Dorthula Perfect, MD     ER/MEDQ  D:  01/01/2009  T:  01/02/2009  Job:  [id number]

## 2010-10-20 NOTE — H&P (Signed)
NAME:  Kimberly Browning, Kimberly Browning NO.:  [id number]   MEDICAL RECORD NO.:  [id number]         PATIENT TYPE:  WAMB   LOCATION:                                [facility]   PHYSICIAN:  Allie Bossier, MD        DATE OF BIRTH:  Oct 24, 1975   DATE OF ADMISSION:  12/25/2008  DATE OF DISCHARGE:                              HISTORY & PHYSICAL   HISTORY OF PRESENT ILLNESS:  Kimberly Browning is a 35 year old single, divorced  white gravida 2, para 1, who was seen by [facility] on December 11, 2008 by Dr. Lafonda Mosses.  She was disposed her new OB  visits.  At that time, the patient has no particular complaints, but an  ultrasound was ordered.  The ultrasound showed multiple sac-like cystic  structures within the endometrial cavity which are uncertain etiology.  Differential diagnosis including multiple gestation and gestational  trophoblastic neoplasia.  It was recommended that she should follow up  with quantitative beta.  On the December 13, 2008, her quantitative beta was  71,000.  Three days later, it was down to 61,000.  I have explained to  her that this indicates an abnormal uterine pregnancy that will not  results in the baby.  An ultrasound was then repeated on December 19, 2000,  and again the there is 3-4 cystic structures in the endometrium.  The  largest appearing smaller on this exam than the prior exam.  No fetal  poles or yolk structures seen.  No fetal pulses are seen.  Her blood  type is A positive.   PAST MEDICAL HISTORY:  Bipolar disorder.   MEDICATIONS:  She now takes multivitamin.  Please note that she quit  taking her Pristiq a month ago.   PAST SURGICAL HISTORY:  None.   ALLERGIES:  No known drug allergies.  No latex allergies.   PHYSICAL EXAMINATION:  VITAL SIGNS:  Height 5 feet 4 inches, weight 126  pounds, blood pressure 109/70, and pulse 81.  HEENT:  Normal.  HEART:  Regular rate and rhythm.  LUNGS:  Clear to auscultation bilaterally.   ASSESSMENT AND PLAN:  Abnormal pregnancy with following status, however,  with the risk of gestational trophoblastic neoplasia was highly  recommended D and C, this will be done tomorrow by Dr. Macon Large.      Allie Bossier, MD  Electronically Signed     MCD/MEDQ  D:  12/25/2008  T:  12/26/2008  Job:  [id number]

## 2011-05-03 ENCOUNTER — Encounter (HOSPITAL_BASED_OUTPATIENT_CLINIC_OR_DEPARTMENT_OTHER): Payer: Self-pay | Admitting: *Deleted

## 2011-05-03 NOTE — Pre-Procedure Instructions (Addendum)
To come for BMET, CBC, diff, BP check; to bring overnight bag for [facility] DOS  Pt. slow to respond over the phone to questions asked, had to talk loudly to her, and keep calling her name to get her to respond.  Denies taking any other meds. except what is listed above.  PCP - [facility] - [phone]  Zoe Lan, NP)

## 2011-05-10 ENCOUNTER — Encounter (HOSPITAL_BASED_OUTPATIENT_CLINIC_OR_DEPARTMENT_OTHER)
Admission: RE | Disposition: A | Discharge status: Home / Self Care | Payer: Self-pay | Source: Ambulatory Visit | Attending: Specialist

## 2011-05-10 ENCOUNTER — Encounter (HOSPITAL_BASED_OUTPATIENT_CLINIC_OR_DEPARTMENT_OTHER): Payer: Self-pay

## 2011-05-10 ENCOUNTER — Other Ambulatory Visit: Payer: Self-pay | Admitting: Specialist

## 2011-05-10 ENCOUNTER — Encounter (HOSPITAL_BASED_OUTPATIENT_CLINIC_OR_DEPARTMENT_OTHER): Payer: Self-pay | Admitting: Anesthesiology

## 2011-05-10 ENCOUNTER — Ambulatory Visit (HOSPITAL_BASED_OUTPATIENT_CLINIC_OR_DEPARTMENT_OTHER): Payer: Medicare Other | Admitting: Anesthesiology

## 2011-05-10 ENCOUNTER — Ambulatory Visit (HOSPITAL_BASED_OUTPATIENT_CLINIC_OR_DEPARTMENT_OTHER)
Admission: RE | Admit: 2011-05-10 | Discharge: 2011-05-11 | Disposition: A | Discharge status: Home / Self Care | Payer: Medicare Other | Source: Ambulatory Visit | Attending: Specialist | Admitting: Specialist

## 2011-05-10 ENCOUNTER — Encounter (HOSPITAL_BASED_OUTPATIENT_CLINIC_OR_DEPARTMENT_OTHER): Payer: Self-pay | Admitting: Certified Registered"

## 2011-05-10 DIAGNOSIS — N62 Hypertrophy of breast: Secondary | ICD-10-CM | POA: Insufficient documentation

## 2011-05-10 HISTORY — PX: BREAST REDUCTION SURGERY: SHX8

## 2011-05-10 HISTORY — DX: Gastro-esophageal reflux disease without esophagitis: K21.9

## 2011-05-10 LAB — POCT I-STAT, CHEM 8
BUN: 10 mg/dL (ref 6–23)
Calcium, Ion: 1.16 mmol/L (ref 1.12–1.32)
Chloride: 105 mEq/L (ref 96–112)
Glucose, Bld: 86 mg/dL (ref 70–99)

## 2011-05-10 SURGERY — MAMMOPLASTY, REDUCTION
Anesthesia: General | Site: Breast | Laterality: Bilateral | Wound class: Clean

## 2011-05-10 MED ORDER — CEFAZOLIN SODIUM 1-5 GM-% IV SOLN
INTRAVENOUS | Status: DC | PRN
  Administered 2011-05-10: 1 g via INTRAVENOUS

## 2011-05-10 MED ORDER — LIDOCAINE HCL (CARDIAC) 20 MG/ML IV SOLN
INTRAVENOUS | Status: DC | PRN
  Administered 2011-05-10: 40 mg via INTRAVENOUS

## 2011-05-10 MED ORDER — VENLAFAXINE HCL ER 75 MG PO CP24: 75.0000 mg | ORAL_CAPSULE | Freq: Two times a day (BID) | ORAL | Status: DC

## 2011-05-10 MED ORDER — OXYCODONE-ACETAMINOPHEN 5-325 MG PO TABS
1.0000 | ORAL_TABLET | ORAL | Status: DC | PRN
  Administered 2011-05-10: 2 via ORAL

## 2011-05-10 MED ORDER — DIPHENHYDRAMINE HCL 50 MG/ML IJ SOLN: 12.5000 mg | Freq: Four times a day (QID) | INTRAMUSCULAR | Status: DC | PRN

## 2011-05-10 MED ORDER — HYDROMORPHONE HCL PF 1 MG/ML IJ SOLN
0.2500 mg | INTRAMUSCULAR | Status: DC | PRN
  Administered 2011-05-10 (×4): 0.5 mg via INTRAVENOUS

## 2011-05-10 MED ORDER — DIPHENHYDRAMINE HCL 12.5 MG/5ML PO ELIX: 12.5000 mg | ORAL_SOLUTION | Freq: Four times a day (QID) | ORAL | Status: DC | PRN

## 2011-05-10 MED ORDER — SODIUM CHLORIDE 0.9 % IJ SOLN
INTRAMUSCULAR | Status: DC | PRN
  Administered 2011-05-10: 400 mL

## 2011-05-10 MED ORDER — ONDANSETRON HCL 4 MG/2ML IJ SOLN: 4.0000 mg | Freq: Four times a day (QID) | INTRAMUSCULAR | Status: DC | PRN

## 2011-05-10 MED ORDER — MORPHINE SULFATE (PF) 1 MG/ML IV SOLN: INTRAVENOUS | Status: DC

## 2011-05-10 MED ORDER — OXYCODONE-ACETAMINOPHEN 2.5-325 MG PO TABS: 1.0000 | ORAL_TABLET | ORAL | Status: AC | PRN

## 2011-05-10 MED ORDER — PROMETHAZINE HCL 25 MG/ML IJ SOLN: 6.2500 mg | INTRAMUSCULAR | Status: DC | PRN

## 2011-05-10 MED ORDER — MEPERIDINE HCL 25 MG/ML IJ SOLN: 6.2500 mg | INTRAMUSCULAR | Status: DC | PRN

## 2011-05-10 MED ORDER — CEFAZOLIN SODIUM 1-5 GM-% IV SOLN
1.0000 g | Freq: Three times a day (TID) | INTRAVENOUS | Status: DC
  Administered 2011-05-10 (×2): 1 g via INTRAVENOUS

## 2011-05-10 MED ORDER — DEXTROSE IN LACTATED RINGERS 5 % IV SOLN
INTRAVENOUS | Status: DC
  Administered 2011-05-10: 13:00:00 via INTRAVENOUS

## 2011-05-10 MED ORDER — EPHEDRINE SULFATE 50 MG/ML IJ SOLN
INTRAMUSCULAR | Status: DC | PRN
  Administered 2011-05-10 (×2): 5 mg via INTRAVENOUS

## 2011-05-10 MED ORDER — MIDAZOLAM HCL 2 MG/2ML IJ SOLN: 0.5000 mg | INTRAMUSCULAR | Status: DC | PRN

## 2011-05-10 MED ORDER — SODIUM CHLORIDE 0.9 % IJ SOLN: 9.0000 mL | INTRAMUSCULAR | Status: DC | PRN

## 2011-05-10 MED ORDER — DROPERIDOL 2.5 MG/ML IJ SOLN
INTRAMUSCULAR | Status: DC | PRN
  Administered 2011-05-10: 0.625 mg via INTRAVENOUS

## 2011-05-10 MED ORDER — LIDOCAINE-EPINEPHRINE 0.5-1:200000 % IJ SOLN
INTRAMUSCULAR | Status: DC | PRN
  Administered 2011-05-10: 100 mL

## 2011-05-10 MED ORDER — GLYCOPYRROLATE 0.2 MG/ML IJ SOLN
INTRAMUSCULAR | Status: DC | PRN
  Administered 2011-05-10: .4 mg via INTRAVENOUS

## 2011-05-10 MED ORDER — DIPHENHYDRAMINE HCL 25 MG PO CAPS
25.0000 mg | ORAL_CAPSULE | Freq: Four times a day (QID) | ORAL | Status: DC | PRN
  Administered 2011-05-10 – 2011-05-11 (×2): 25 mg via ORAL

## 2011-05-10 MED ORDER — ACETAMINOPHEN 10 MG/ML IV SOLN
1000.0000 mg | Freq: Four times a day (QID) | INTRAVENOUS | Status: DC
  Administered 2011-05-10: 1000 mg via INTRAVENOUS

## 2011-05-10 MED ORDER — NEOSTIGMINE METHYLSULFATE 1 MG/ML IJ SOLN
INTRAMUSCULAR | Status: DC | PRN
  Administered 2011-05-10: 2.5 mg via INTRAVENOUS

## 2011-05-10 MED ORDER — PROPOFOL 10 MG/ML IV EMUL
INTRAVENOUS | Status: DC | PRN
  Administered 2011-05-10: 150 mg via INTRAVENOUS
  Administered 2011-05-10: 50 mg via INTRAVENOUS

## 2011-05-10 MED ORDER — MIDAZOLAM HCL 5 MG/5ML IJ SOLN
INTRAMUSCULAR | Status: DC | PRN
  Administered 2011-05-10: 2 mg via INTRAVENOUS

## 2011-05-10 MED ORDER — DEXAMETHASONE SODIUM PHOSPHATE 4 MG/ML IJ SOLN
INTRAMUSCULAR | Status: DC | PRN
  Administered 2011-05-10: 10 mg via INTRAVENOUS

## 2011-05-10 MED ORDER — ONDANSETRON HCL 4 MG/2ML IJ SOLN
INTRAMUSCULAR | Status: DC | PRN
  Administered 2011-05-10: 4 mg via INTRAVENOUS

## 2011-05-10 MED ORDER — NALOXONE HCL 0.4 MG/ML IJ SOLN: 0.4000 mg | INTRAMUSCULAR | Status: DC | PRN

## 2011-05-10 MED ORDER — CEPHALEXIN 500 MG PO CAPS: 500.0000 mg | ORAL_CAPSULE | Freq: Four times a day (QID) | ORAL | Status: AC

## 2011-05-10 MED ORDER — ROCURONIUM BROMIDE 100 MG/10ML IV SOLN
INTRAVENOUS | Status: DC | PRN
  Administered 2011-05-10: 40 mg via INTRAVENOUS

## 2011-05-10 MED ORDER — FENTANYL CITRATE 0.05 MG/ML IJ SOLN: 50.0000 ug | INTRAMUSCULAR | Status: DC | PRN

## 2011-05-10 MED ORDER — LACTATED RINGERS IV SOLN
INTRAVENOUS | Status: DC
  Administered 2011-05-10 (×3): via INTRAVENOUS

## 2011-05-10 MED ORDER — FENTANYL CITRATE 0.05 MG/ML IJ SOLN
INTRAMUSCULAR | Status: DC | PRN
  Administered 2011-05-10 (×2): 100 ug via INTRAVENOUS
  Administered 2011-05-10: 50 ug via INTRAVENOUS

## 2011-05-10 SURGICAL SUPPLY — 55 items
BAG DECANTER FOR FLEXI CONT (MISCELLANEOUS) ×2 IMPLANT
BENZOIN TINCTURE PRP APPL 2/3 (GAUZE/BANDAGES/DRESSINGS) ×4 IMPLANT
BLADE KNIFE  20 PERSONNA (BLADE) ×2
BLADE KNIFE 20 PERSONNA (BLADE) ×2 IMPLANT
BLADE KNIFE PERSONA 15 (BLADE) ×2 IMPLANT
CANISTER SUCTION 1200CC (MISCELLANEOUS) ×2 IMPLANT
CLOTH BEACON ORANGE TIMEOUT ST (SAFETY) ×2 IMPLANT
COVER MAYO STAND STRL (DRAPES) ×2 IMPLANT
COVER TABLE BACK 60X90 (DRAPES) ×2 IMPLANT
DECANTER SPIKE VIAL GLASS SM (MISCELLANEOUS) ×4 IMPLANT
DRAIN CHANNEL 10F 3/8 F FF (DRAIN) ×4 IMPLANT
DRAPE LAPAROSCOPIC ABDOMINAL (DRAPES) ×2 IMPLANT
DRAPE UTILITY XL STRL (DRAPES) ×2 IMPLANT
DRSG PAD ABDOMINAL 8X10 ST (GAUZE/BANDAGES/DRESSINGS) ×8 IMPLANT
ELECT REM PT RETURN 9FT ADLT (ELECTROSURGICAL) ×2
ELECTRODE REM PT RTRN 9FT ADLT (ELECTROSURGICAL) ×1 IMPLANT
EVACUATOR SILICONE 100CC (DRAIN) ×4 IMPLANT
FILTER 7/8 IN (FILTER) IMPLANT
GAUZE XEROFORM 5X9 LF (GAUZE/BANDAGES/DRESSINGS) ×4 IMPLANT
GLOVE BIO SURGEON STRL SZ 6.5 (GLOVE) ×6 IMPLANT
GLOVE BIOGEL M STRL SZ7.5 (GLOVE) ×2 IMPLANT
GLOVE BIOGEL PI IND STRL 8 (GLOVE) ×1 IMPLANT
GLOVE BIOGEL PI INDICATOR 8 (GLOVE) ×1
GLOVE ECLIPSE 7.0 STRL STRAW (GLOVE) ×2 IMPLANT
GOWN PREVENTION PLUS XLARGE (GOWN DISPOSABLE) IMPLANT
GOWN PREVENTION PLUS XXLARGE (GOWN DISPOSABLE) ×4 IMPLANT
IV NS 500ML (IV SOLUTION) ×1
IV NS 500ML BAXH (IV SOLUTION) ×1 IMPLANT
NEEDLE SPNL 18GX3.5 QUINCKE PK (NEEDLE) ×2 IMPLANT
NS IRRIG 1000ML POUR BTL (IV SOLUTION) IMPLANT
PACK BASIN DAY SURGERY FS (CUSTOM PROCEDURE TRAY) ×2 IMPLANT
PEN SKIN MARKING BROAD TIP (MISCELLANEOUS) ×2 IMPLANT
PILLOW FOAM RUBBER ADULT (PILLOWS) ×2 IMPLANT
PIN SAFETY STERILE (MISCELLANEOUS) ×2 IMPLANT
SHEETING SILICONE GEL EPI DERM (MISCELLANEOUS) IMPLANT
SPECIMEN JAR MEDIUM (MISCELLANEOUS) ×4 IMPLANT
SPECIMEN JAR X LARGE (MISCELLANEOUS) IMPLANT
SPONGE GAUZE 4X4 12PLY (GAUZE/BANDAGES/DRESSINGS) ×8 IMPLANT
SPONGE LAP 18X18 X RAY DECT (DISPOSABLE) ×8 IMPLANT
STRIP SUTURE WOUND CLOSURE 1/2 (SUTURE) ×10 IMPLANT
SUT MNCRL AB 3-0 PS2 18 (SUTURE) ×12 IMPLANT
SUT MON AB 2-0 CT1 36 (SUTURE) IMPLANT
SUT MON AB 5-0 PS2 18 (SUTURE) ×6 IMPLANT
SUT PROLENE 3 0 PS 2 (SUTURE) ×12 IMPLANT
SYR 50ML LL SCALE MARK (SYRINGE) ×4 IMPLANT
SYR CONTROL 10ML LL (SYRINGE) IMPLANT
TAPE HYPAFIX 6X30 (GAUZE/BANDAGES/DRESSINGS) ×2 IMPLANT
TAPE MEASURE 72IN RETRACT (INSTRUMENTS)
TAPE MEASURE LINEN 72IN RETRCT (INSTRUMENTS) IMPLANT
TAPE PAPER MEDFIX 1IN X 10YD (GAUZE/BANDAGES/DRESSINGS) ×2 IMPLANT
TUBE CONNECTING 20X1/4 (TUBING) ×2 IMPLANT
UNDERPAD 30X30 INCONTINENT (UNDERPADS AND DIAPERS) ×6 IMPLANT
VAC PENCILS W/TUBING CLEAR (MISCELLANEOUS) ×2 IMPLANT
WATER STERILE IRR 1000ML POUR (IV SOLUTION) ×2 IMPLANT
YANKAUER SUCT BULB TIP NO VENT (SUCTIONS) ×2 IMPLANT

## 2011-05-10 NOTE — Brief Op Note (Signed)
05/10/2011  10:35 AM  PATIENT:  Kimberly Browning  35 y.o. female  PRE-OPERATIVE DIAGNOSIS:  macromastia  POST-OPERATIVE DIAGNOSIS:  macromastia  PROCEDURE:  Procedure(s): MAMMARY REDUCTION BILATERAL (BREAST)  SURGEON:  Surgeon(s): Rosalio Macadamia  PHYSICIAN ASSISTANT:   ASSISTANTS: none   ANESTHESIA:   general  EBL:  Total I/O In: 1000 [I.V.:1000] Out: -   BLOOD ADMINISTERED:none  DRAINS: () Jackson-Pratt drain(s) with closed bulb suction in the    LOCAL MEDICATIONS USED:  XYLOCAINE 150CC  SPECIMEN:  Excision  DISPOSITION OF SPECIMEN:  PATHOLOGY  COUNTS:  YES  TOURNIQUET:  * No tourniquets in log *  DICTATION: .Other Dictation: Dictation Number [id number]  PLAN OF CARE: Admit for overnight observation  PATIENT DISPOSITION:  Short Stay   Delay start of Pharmacological VTE agent (>24hrs) due to surgical blood loss or risk of bleeding:  {YES/NO/NOT APPLICABLE:20182

## 2011-05-10 NOTE — Transfer of Care (Signed)
Immediate Anesthesia Transfer of Care Note  Patient: Kimberly Browning  Procedure(s) Performed:  MAMMARY REDUCTION BILATERAL (BREAST) - bilateral breast reduction  Patient Location: PACU  Anesthesia Type: General  Level of Consciousness: sedated  Airway & Oxygen Therapy: Patient Spontanous Breathing and Patient connected to face mask oxygen  Post-op Assessment: Report given to PACU RN and Post -op Vital signs reviewed and stable  Post vital signs: Reviewed and stable  Complications: No apparent anesthesia complications

## 2011-05-10 NOTE — H&P (Signed)
Subjective:   Patient is a 35 y.o. female presents with macromasia. The pain is located chest. Patient describes the pain as sevwere  and rated as severe. Pain has been associated with intertrigo. Patient denies . Symptoms are aggravated by *heavy lifting. Symptoms improve with. Past history includes .  Previous studies include .  Patient Active Problem List  Diagnoses Date Noted  . TOBACCO USER 06/09/2009  . FETAL ALCOHOL SYNDROME 01/23/2009  . MEMORY LOSS 01/23/2009  . MOLAR PREGNANCY 12/12/2008  . INSOMNIA 12/11/2008  . 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 BACK PAIN, LUMBAR 10/10/2007  . OBESITY NOS 02/22/2007  . HYPERLIPIDEMIA 01/03/2007  . 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 BIPOLAR DISORDER 08/04/2006  . SEBORRHEIC DERMATITIS, NOS 08/04/2006   Past Medical History  Diagnosis Date  . GERD (gastroesophageal reflux disease)   . Macromastia   . Manic depression     Past Surgical History  Procedure Date  . Wisdom tooth extraction as a teenager  . Dilation and evacuation 12/26/2008    Prescriptions prior to admission  Medication Sig Dispense Refill  . desvenlafaxine (PRISTIQ) 100 MG 24 hr tablet Take 100 mg by mouth daily at 12 noon.        . ranitidine (ZANTAC) 150 MG tablet Take 150 mg by mouth as needed.         Allergies  Allergen Reactions  . Geodon (Ziprasidone Hydrochloride) Shortness Of Breath and Swelling  . Valproic Acid Rash    History  Substance Use Topics  . Smoking status: Current Everyday Smoker -- 15 years    Types: Cigarettes  . Smokeless tobacco: Never Used   Comment: 2 packs/week  . Alcohol Use: No    History reviewed. No pertinent family history.  Review of Systems A comprehensive review of systems was negative except for: Behavioral/Psych: positive for depression  Objective:   Patient Vitals for the past 8 hrs:  BP Temp Temp src Pulse Resp SpO2  05/10/11 0638 147/87 mmHg 98.7 F (37.1 C) Oral 68  20  98 %          BP 147/87  Pulse 68   Temp(Src) 98.7 F (37.1 C) (Oral)  Resp 20  Ht 5\' 4"  (1.626 m)  Wt 58.968 kg (130 lb)  BMI 22.31 kg/m2  SpO2 98%  General Appearance:    Alert, cooperative, no distress, appears stated age  Head:    Normocephalic, without obvious abnormality, atraumatic  Eyes:    PERRL, conjunctiva/corneas clear, EOM's intact, fundi    benign, both eyes  Ears:    Normal TM's and external ear canals, both ears  Nose:   Nares normal, septum midline, mucosa normal, no drainage    or sinus tenderness  Throat:   Lips, mucosa, and tongue normal; teeth and gums normal  Neck:   Supple, symmetrical, trachea midline, no adenopathy;    thyroid:  no enlargement/tenderness/nodules; no carotid   bruit or JVD  Back:     Symmetric, no curvature, ROM normal, no CVA tenderness  Lungs:     Clear to auscultation bilaterally, respirations unlabored  Chest Wall:    No tenderness or deformity   Heart:    Regular rate and rhythm, S1 and S2 normal, no murmur, rub   or gallop  Breast Exam:    No tenderness, masses, or nipple abnormality  Abdomen:     Soft, non-tender, bowel sounds active all four quadrants,    no masses, no organomegaly  Genitalia:    Normal female without  lesion, discharge or tenderness  Rectal:    Normal tone, normal prostate, no masses or tenderness;   guaiac negative stool  Extremities:   Extremities normal, atraumatic, no cyanosis or edema  Pulses:   2+ and symmetric all extremities  Skin:   Skin color, texture, turgor normal, no rashes or lesions  Lymph nodes:   Cervical, supraclavicular, and axillary nodes normal  Neurologic:   CNII-XII intact, normal strength, sensation and reflexes    throughout    ECG: Data Review CBC:  Lab Results  Component Value Date   WBC 7.9 01/16/2009   RBC 4.29 01/16/2009   BMP:  Lab Results  Component Value Date   GLUCOSE 86 05/10/2011   CO2 24 04/24/2009   BUN 10 05/10/2011   CREATININE 0.90 05/10/2011   CALCIUM 9.4 04/24/2009    Assessment:   Active  Problems:  * No active hospital problems. *    Plan:  Bilateral breast reductions

## 2011-05-10 NOTE — Op Note (Signed)
NAME:  Kimberly Browning, Kimberly Browning                     ACCOUNT NO.:  MEDICAL RECORD NO.:  [id number]  LOCATION:                                 FACILITY:  PHYSICIAN:  Lecia Esperanza L. Shon Hough, M.D.   DATE OF BIRTH:  DATE OF PROCEDURE:  05/10/2011 DATE OF DISCHARGE:                              OPERATIVE REPORT   A 35 year old lady with severe macromastia, back and shoulder pain secondary to large pendulous breasts, intertriginous changes in the past as well.  PROCEDURES DONE:  Bilateral breast reductions using the inferior pedicle technique.  ANESTHESIA:  General.  Preoperatively, the patient was sat up and drawn for the reduction mammoplasty remarking the nipple-areolar complexes from over 32 cm to 20 cm.  She then underwent general anesthesia and intubated orally.  Prep was done to the chest, breast areas in a routine fashion using Hibiclens soap and solution, walled off with sterile towels and drapes so as to make a sterile field.  1/4% Xylocaine with epinephrine was injected, a total of 200 mL after waiting for a period, the wounds were scored with a #15 blade.  The skin of the inferior pedicle was de-epithelialized with a #20 blade.  Medial and lateral fatty dermal pedicles were incised down the underlying pectoralis major muscle.  The new keyhole area was also debulked to remove over 315 g on the left side and over 353 g on the right.  After proper hemostasis, the flaps were transposed and stayed with 3-0 Prolene.  Subcutaneous closure was done with 3-0 Monocryl x2 layers and then a running subcuticular stitch of 3-0 Monocryl and 5-0 Monocryl throughout the inverted T.  The wounds were drained with #10 fully-fluted Blake Jackson-Pratt drains, which were placed in the depths of wound and brought out through the lateral-most portion of the incision and secured with 3-0 Prolene.  The wounds were cleansed, nipple-areolar complex were examined with good suppleness and excellent blood supply.   Steri-Strips and soft dressing were applied at all areas including Xeroform, 4x4s, ABDs, Hypafix tape.  ESTIMATED BLOOD LOSS:  Less than 150 mL.  COMPLICATIONS:  None.  She was then taken to recovery in excellent condition.     Yaakov Guthrie. Shon Hough, M.D.     Cathie Hoops  D:  05/10/2011  T:  05/10/2011  Job:  [id number]

## 2011-05-10 NOTE — Progress Notes (Signed)
05/10/2011 1115 am  Noted lots of reddened areas on both legs - pt stated it is flea bites.

## 2011-05-10 NOTE — Anesthesia Procedure Notes (Signed)
Procedure Name: Intubation Date/Time: 05/10/2011 8:32 AM Performed by: Radford Pax Pre-anesthesia Checklist: Patient identified, Emergency Drugs available, Suction available, Patient being monitored and Timeout performed Patient Re-evaluated:Patient Re-evaluated prior to inductionOxygen Delivery Method: Circle System Utilized Preoxygenation: Pre-oxygenation with 100% oxygen Intubation Type: IV induction Ventilation: Mask ventilation without difficulty Laryngoscope Size: Miller and 3 Grade View: Grade I Tube type: Oral Tube size: 7.0 mm Number of attempts: 1 (easy intubation, atraumatic) Airway Equipment and Method: stylet Placement Confirmation: ETT inserted through vocal cords under direct vision,  positive ETCO2 and breath sounds checked- equal and bilateral (cords open and clear) Secured at: 21 cm Tube secured with: Tape (pink tape at 21cm at lip line) Dental Injury: Teeth and Oropharynx as per pre-operative assessment

## 2011-05-10 NOTE — Anesthesia Postprocedure Evaluation (Signed)
  Anesthesia Post-op Note  Patient: Kimberly Browning  Procedure(s) Performed:  MAMMARY REDUCTION BILATERAL (BREAST) - bilateral breast reduction  Patient Location: PACU  Anesthesia Type: General  Level of Consciousness: sedated  Airway and Oxygen Therapy: Patient Spontanous Breathing and Patient connected to face mask oxygen  Post-op Pain: none  Post-op Assessment: Post-op Vital signs reviewed, Patient's Cardiovascular Status Stable, Respiratory Function Stable, Patent Airway and No signs of Nausea or vomiting  Post-op Vital Signs: Reviewed and stable  Complications: No apparent anesthesia complications

## 2011-05-10 NOTE — Anesthesia Preprocedure Evaluation (Signed)
Anesthesia Evaluation  Patient identified by MRN, date of birth, ID band Patient awake    Reviewed: Allergy & Precautions, H&P , NPO status , Patient's Chart, lab work & pertinent test results  Airway Mallampati: I TM Distance: >3 FB Neck ROM: full    Dental No notable dental hx. (+) Teeth Intact and Chipped   Pulmonary neg pulmonary ROS,  clear to auscultation  Pulmonary exam normal       Cardiovascular neg cardio ROS regular Normal    Neuro/Psych Negative Neurological ROS  Negative Psych ROS   GI/Hepatic Neg liver ROS, Medicated,  Endo/Other  Negative Endocrine ROS  Renal/GU negative Renal ROS  Genitourinary negative   Musculoskeletal   Abdominal   Peds  Hematology negative hematology ROS (+)   Anesthesia Other Findings   Reproductive/Obstetrics negative OB ROS                           Anesthesia Physical Anesthesia Plan  ASA: II  Anesthesia Plan: General   Post-op Pain Management:    Induction: Intravenous  Airway Management Planned: Oral ETT  Additional Equipment:   Intra-op Plan:   Post-operative Plan: Extubation in OR  Informed Consent: I have reviewed the patients History and Physical, chart, labs and discussed the procedure including the risks, benefits and alternatives for the proposed anesthesia with the patient or authorized representative who has indicated his/her understanding and acceptance.     Plan Discussed with: CRNA  Anesthesia Plan Comments:         Anesthesia Quick Evaluation

## 2011-05-14 ENCOUNTER — Encounter (HOSPITAL_BASED_OUTPATIENT_CLINIC_OR_DEPARTMENT_OTHER): Payer: Self-pay | Admitting: Specialist

## 2012-02-04 ENCOUNTER — Ambulatory Visit
Admission: RE | Admit: 2012-02-04 | Discharge: 2012-02-04 | Disposition: A | Discharge status: Home / Self Care | Payer: Medicare Other | Source: Ambulatory Visit | Attending: Nurse Practitioner | Admitting: Nurse Practitioner

## 2012-02-04 ENCOUNTER — Other Ambulatory Visit: Payer: Self-pay | Admitting: Nurse Practitioner

## 2012-02-04 ENCOUNTER — Other Ambulatory Visit: Payer: Self-pay | Admitting: Family Medicine

## 2012-02-04 DIAGNOSIS — M549 Dorsalgia, unspecified: Secondary | ICD-10-CM

## 2012-04-14 DIAGNOSIS — N318 Other neuromuscular dysfunction of bladder: Secondary | ICD-10-CM

## 2012-04-14 DIAGNOSIS — L709 Acne, unspecified: Secondary | ICD-10-CM | POA: Insufficient documentation

## 2012-04-14 HISTORY — DX: Other neuromuscular dysfunction of bladder: N31.8

## 2014-02-15 ENCOUNTER — Other Ambulatory Visit: Payer: Self-pay | Admitting: Gastroenterology

## 2014-07-01 ENCOUNTER — Other Ambulatory Visit: Payer: Self-pay | Admitting: Urology

## 2014-07-03 ENCOUNTER — Encounter (HOSPITAL_BASED_OUTPATIENT_CLINIC_OR_DEPARTMENT_OTHER): Payer: Self-pay | Admitting: *Deleted

## 2014-07-04 ENCOUNTER — Encounter (HOSPITAL_BASED_OUTPATIENT_CLINIC_OR_DEPARTMENT_OTHER): Payer: Self-pay | Admitting: *Deleted

## 2014-07-04 NOTE — Progress Notes (Signed)
NPO AFTER MN.  ARRIVE AT 0830.  NEEDS HG AND URINE PREG.  

## 2014-07-07 NOTE — Anesthesia Preprocedure Evaluation (Addendum)
Anesthesia Evaluation  Patient identified by MRN, date of birth, ID band Patient awake    Reviewed: Allergy & Precautions, NPO status , Patient's Chart, lab work & pertinent test results  History of Anesthesia Complications Negative for: history of anesthetic complications  Airway Mallampati: II  TM Distance: >3 FB Neck ROM: Full    Dental no notable dental hx. (+) Dental Advisory Given, Chipped, Missing   Pulmonary Current Smoker,  breath sounds clear to auscultation  Pulmonary exam normal       Cardiovascular Exercise Tolerance: Good negative cardio ROS  Rhythm:Regular Rate:Normal     Neuro/Psych PSYCHIATRIC DISORDERS Anxiety Depression Bipolar Disorder negative neurological ROS     GI/Hepatic Neg liver ROS, GERD-  Controlled and Medicated,  Endo/Other  negative endocrine ROS  Renal/GU negative Renal ROS  negative genitourinary   Musculoskeletal negative musculoskeletal ROS (+)   Abdominal   Peds negative pediatric ROS (+)  Hematology negative hematology ROS (+)   Anesthesia Other Findings   Reproductive/Obstetrics negative OB ROS                            Anesthesia Physical Anesthesia Plan  ASA: II  Anesthesia Plan: MAC   Post-op Pain Management:    Induction: Intravenous  Airway Management Planned: Natural Airway and Nasal Cannula  Additional Equipment:   Intra-op Plan:   Post-operative Plan: Extubation in OR  Informed Consent: I have reviewed the patients History and Physical, chart, labs and discussed the procedure including the risks, benefits and alternatives for the proposed anesthesia with the patient or authorized representative who has indicated his/her understanding and acceptance.   Dental advisory given  Plan Discussed with: CRNA  Anesthesia Plan Comments:         Anesthesia Quick Evaluation

## 2014-07-08 NOTE — H&P (Signed)
History of Present Illness   Kimberly Orban leaks with coughing and sneezing as well as urgency. She does not wear a pad. She voids every 15-30 minutes but cannot sit through a 2-hour movie. She gets up 4 times a night. She did not have pelvic pain. She has failed Detrol and VESIcare. She had grade 2 hypermobility of the bladder neck and no stress incontinence. She emptied efficiently. She leaks when she is on a trampoline. Her frequency was impressive and her nocturia was somewhat out of the ordinary for her age. I thought her mental illness and her medications may also play a role.    When I saw Kimberly Browning last time, she leaked a mild amount with Valsalva pressure of 160 cmH2O. Maximum capacity was 440 mL with no pain. I thought neuromodulation treatments were better than a sling moving forward. She does not normally wear a pad and again, her primary problem is the significant frequency, as noted last time, and her urge incontinence. She does not cough a lot. We gave her oxybutynin ER 10 mg after failing Myrbetriq. I went through the medical records and I do not see a firm psychiatric diagnosis.   There are no other modifying factors or associated signs or symptoms. There are no other aggravating or relieving factors. Her presentation is moderate in severity and ongoing.    Past Medical History Problems  1. History of Anxiety (F41.9) 2. History of depression (Z86.59) 3. History of esophageal reflux (Z87.19) 4. History of Retinitis pigmentosa (H35.52)  Current Meds 1. Benadryl TABS;  Therapy: (Recorded:07Aug2015) to Recorded 2. Iron TABS;  Therapy: (Recorded:07Aug2015) to Recorded 3. Pristiq 100 MG Oral Tablet Extended Release 24 Hour;  Therapy: [id number] to Recorded  Allergies Medication  1. Depakote 2. Geodon 3. Toviaz TB57  Family History Problems  1. Family history of Medical history non-contributory   patient was adopted 2. No pertinent family history : Father  Social  History Problems  1. Denied: History of Alcohol use 2. Current every day smoker (F17.200) 3. Former cigarette smoker [phone])   2 packs a week for 13 years, quit 10/03 - 6/04 as of 01/11/14 office visit. 4. No caffeine use 5. One child   daughter  Vitals Vital Signs [Data Includes: Last 1 Day]  Recorded: [id number] 11:08AM  Blood Pressure: 117 / 58 Temperature: 98.9 F Heart Rate: 78  Assessment Assessed  1. Increased urinary frequency (R35.0) 2. Urge and stress incontinence (N39.46)  Plan Urge and stress incontinence  1. Follow-up PRN Office  Follow-up  Status: Complete  Done: [id number]  Discussion/Summary   Kimberly Eustache failed oxybutynin. We talked about PTNS.  Pros, cons, success and failure rates of PTNS were discussed. Treatment protocol was reviewed. Risks were described but not limited to the risk of persistent, de novo, or worsening incontinence. Risks of pain, bruising, bleeding, infection, and neuropathy were discussed.   I talked about Botox.  Pros, cons, success and failure rates of Botox were discussed. We talked about off-label usage, durability, and retreatment rates. Risks were described but not limited to the risk of persistent, de novo, or worsening incontinence. We talked about the risk of retention requiring catheterization. We talked about the risk of flow symptoms and high residual urine volumes. Risks of pain, bleeding, infection, and neuropathy were discussed. Rare risks of nerve paralysis and death were discussed. The patient understands that she might not reach her treatment goal and that she might be worse following surgery.  Handouts given. She will call  if she would like to proceed with one.  She has been having some left flank pain that lasted for 3 hours on Saturday. It came on acutely and went away. There are no other modifying factors or associated signs or symptoms. There are no other aggravating or relieving factors. Her presentation was moderate  in severity but went away.  If it recurs, she will call me. She has not leaked her urine today.   I also talked to Kimberly Stivers about Interstim.   Pros, cons, success and failure rates of Interstim were discussed. We talked about the test stimulation (office/operating room) and the second stage procedure. Risks were described but not limited to the risk of persistent, de novo, or worsening incontinence. Risks of pain, bleeding, infection, and neuropathy were discussed. Risk of malfunction, migration, and breakage were discussed. Trouble-shooting, battery life, and the need for explanation and reoperation were discussed. MRI issues were discussed. The patient understands that she might not reach her treatment goal and that she might be worse following surgery.  Subsequently, Kimberly Zuelke has called and she thinks she would like to do a PNE.  After a thorough review of the management options for the patient's condition the patient  elected to proceed with surgical therapy as noted above. We have discussed the potential benefits and risks of the procedure, side effects of the proposed treatment, the likelihood of the patient achieving the goals of the procedure, and any potential problems that might occur during the procedure or recuperation. Informed consent has been obtained.

## 2014-07-09 ENCOUNTER — Ambulatory Visit (HOSPITAL_BASED_OUTPATIENT_CLINIC_OR_DEPARTMENT_OTHER): Payer: Medicare Other | Admitting: Anesthesiology

## 2014-07-09 ENCOUNTER — Ambulatory Visit (HOSPITAL_COMMUNITY): Payer: Medicare Other

## 2014-07-09 ENCOUNTER — Encounter (HOSPITAL_BASED_OUTPATIENT_CLINIC_OR_DEPARTMENT_OTHER)
Admission: RE | Disposition: A | Discharge status: Home / Self Care | Payer: Self-pay | Source: Ambulatory Visit | Attending: Urology

## 2014-07-09 ENCOUNTER — Encounter (HOSPITAL_BASED_OUTPATIENT_CLINIC_OR_DEPARTMENT_OTHER): Payer: Self-pay

## 2014-07-09 ENCOUNTER — Ambulatory Visit (HOSPITAL_BASED_OUTPATIENT_CLINIC_OR_DEPARTMENT_OTHER)
Admission: RE | Admit: 2014-07-09 | Discharge: 2014-07-09 | Disposition: A | Discharge status: Home / Self Care | Payer: Medicare Other | Source: Ambulatory Visit | Attending: Urology | Admitting: Urology

## 2014-07-09 DIAGNOSIS — Z888 Allergy status to other drugs, medicaments and biological substances status: Secondary | ICD-10-CM | POA: Diagnosis not present

## 2014-07-09 DIAGNOSIS — F419 Anxiety disorder, unspecified: Secondary | ICD-10-CM | POA: Insufficient documentation

## 2014-07-09 DIAGNOSIS — Z79899 Other long term (current) drug therapy: Secondary | ICD-10-CM | POA: Diagnosis not present

## 2014-07-09 DIAGNOSIS — F329 Major depressive disorder, single episode, unspecified: Secondary | ICD-10-CM | POA: Diagnosis not present

## 2014-07-09 DIAGNOSIS — N3946 Mixed incontinence: Secondary | ICD-10-CM | POA: Insufficient documentation

## 2014-07-09 DIAGNOSIS — N3941 Urge incontinence: Secondary | ICD-10-CM | POA: Diagnosis present

## 2014-07-09 DIAGNOSIS — K219 Gastro-esophageal reflux disease without esophagitis: Secondary | ICD-10-CM | POA: Insufficient documentation

## 2014-07-09 DIAGNOSIS — R350 Frequency of micturition: Secondary | ICD-10-CM | POA: Insufficient documentation

## 2014-07-09 DIAGNOSIS — R52 Pain, unspecified: Secondary | ICD-10-CM

## 2014-07-09 HISTORY — DX: Bipolar disorder, unspecified: F31.9

## 2014-07-09 HISTORY — DX: Urge incontinence: N39.41

## 2014-07-09 HISTORY — DX: Major depressive disorder, single episode, unspecified: F32.9

## 2014-07-09 HISTORY — DX: Depression, unspecified: F32.A

## 2014-07-09 HISTORY — PX: INTERSTIM IMPLANT PLACEMENT: SHX5130

## 2014-07-09 HISTORY — DX: Presence of spectacles and contact lenses: Z97.3

## 2014-07-09 LAB — POCT HEMOGLOBIN-HEMACUE: HEMOGLOBIN: 11.6 g/dL — AB (ref 12.0–15.0)

## 2014-07-09 SURGERY — INSERTION, SACRAL NERVE STIMULATOR, INTERSTIM, STAGE 1
Anesthesia: Monitor Anesthesia Care | Site: Back

## 2014-07-09 MED ORDER — LACTATED RINGERS IV SOLN
INTRAVENOUS | Status: DC
  Administered 2014-07-09 (×2): via INTRAVENOUS
  Filled 2014-07-09: qty 1000

## 2014-07-09 MED ORDER — CEFAZOLIN SODIUM 1-5 GM-% IV SOLN
1.0000 g | INTRAVENOUS | Status: DC
  Filled 2014-07-09: qty 50

## 2014-07-09 MED ORDER — MIDAZOLAM HCL 2 MG/2ML IJ SOLN
INTRAMUSCULAR | Status: AC
  Filled 2014-07-09: qty 4

## 2014-07-09 MED ORDER — LIDOCAINE HCL (CARDIAC) 20 MG/ML IV SOLN
INTRAVENOUS | Status: DC | PRN
  Administered 2014-07-09: 50 mg via INTRAVENOUS

## 2014-07-09 MED ORDER — LIDOCAINE-EPINEPHRINE (PF) 1 %-1:200000 IJ SOLN
INTRAMUSCULAR | Status: DC | PRN
  Administered 2014-07-09: 14 mL

## 2014-07-09 MED ORDER — DEXAMETHASONE SODIUM PHOSPHATE 4 MG/ML IJ SOLN
INTRAMUSCULAR | Status: DC | PRN
  Administered 2014-07-09: 4 mg via INTRAVENOUS

## 2014-07-09 MED ORDER — BUPIVACAINE-EPINEPHRINE 0.5% -1:200000 IJ SOLN
INTRAMUSCULAR | Status: DC | PRN
  Administered 2014-07-09: 14 mL

## 2014-07-09 MED ORDER — PROPOFOL 10 MG/ML IV EMUL
INTRAVENOUS | Status: DC | PRN
  Administered 2014-07-09: 11:00:00 via INTRAVENOUS
  Administered 2014-07-09: 75 ug/kg/min via INTRAVENOUS

## 2014-07-09 MED ORDER — HYDROCODONE-ACETAMINOPHEN 5-325 MG PO TABS
1.0000 | ORAL_TABLET | Freq: Four times a day (QID) | ORAL | Status: DC | PRN
Start: 2014-07-09 — End: 2015-07-14

## 2014-07-09 MED ORDER — MIDAZOLAM HCL 5 MG/5ML IJ SOLN
INTRAMUSCULAR | Status: DC | PRN
  Administered 2014-07-09: 1 mg via INTRAVENOUS
  Administered 2014-07-09: 2 mg via INTRAVENOUS
  Administered 2014-07-09: 1 mg via INTRAVENOUS

## 2014-07-09 MED ORDER — CEPHALEXIN 500 MG PO CAPS: 500.0000 mg | ORAL_CAPSULE | Freq: Three times a day (TID) | ORAL | Status: DC

## 2014-07-09 MED ORDER — FENTANYL CITRATE 0.05 MG/ML IJ SOLN
25.0000 ug | INTRAMUSCULAR | Status: DC | PRN
  Filled 2014-07-09: qty 1

## 2014-07-09 MED ORDER — DIPHENHYDRAMINE HCL 50 MG/ML IJ SOLN
INTRAMUSCULAR | Status: DC | PRN
  Administered 2014-07-09: 12.5 mg via INTRAVENOUS

## 2014-07-09 MED ORDER — FENTANYL CITRATE 0.05 MG/ML IJ SOLN
INTRAMUSCULAR | Status: AC
  Filled 2014-07-09: qty 4

## 2014-07-09 MED ORDER — ONDANSETRON HCL 4 MG/2ML IJ SOLN
INTRAMUSCULAR | Status: DC | PRN
  Administered 2014-07-09: 4 mg via INTRAVENOUS

## 2014-07-09 MED ORDER — PROPOFOL 10 MG/ML IV BOLUS
INTRAVENOUS | Status: DC | PRN
  Administered 2014-07-09: 40 mg via INTRAVENOUS
  Administered 2014-07-09: 100 mg via INTRAVENOUS
  Administered 2014-07-09: 40 mg via INTRAVENOUS

## 2014-07-09 MED ORDER — CEFAZOLIN SODIUM-DEXTROSE 2-3 GM-% IV SOLR
2.0000 g | INTRAVENOUS | Status: AC
  Administered 2014-07-09: 2 g via INTRAVENOUS
  Filled 2014-07-09: qty 50

## 2014-07-09 MED ORDER — ONDANSETRON HCL 4 MG/2ML IJ SOLN
4.0000 mg | Freq: Once | INTRAMUSCULAR | Status: DC | PRN
  Filled 2014-07-09: qty 2

## 2014-07-09 MED ORDER — FENTANYL CITRATE 0.05 MG/ML IJ SOLN
INTRAMUSCULAR | Status: DC | PRN
  Administered 2014-07-09 (×2): 25 ug via INTRAVENOUS
  Administered 2014-07-09: 50 ug via INTRAVENOUS
  Administered 2014-07-09: 25 ug via INTRAVENOUS

## 2014-07-09 MED ORDER — CEFAZOLIN SODIUM-DEXTROSE 2-3 GM-% IV SOLR
INTRAVENOUS | Status: AC
  Filled 2014-07-09: qty 50

## 2014-07-09 SURGICAL SUPPLY — 70 items
BAG URINE DRAINAGE (UROLOGICAL SUPPLIES) ×3 IMPLANT
BAG URINE LEG 500ML (DRAIN) IMPLANT
BANDAGE ADHESIVE 1X3 (GAUZE/BANDAGES/DRESSINGS) IMPLANT
BENZOIN TINCTURE PRP APPL 2/3 (GAUZE/BANDAGES/DRESSINGS) IMPLANT
BLADE HEX COATED 2.75 (ELECTRODE) ×3 IMPLANT
BLADE SURG 15 STRL LF DISP TIS (BLADE) ×1 IMPLANT
BLADE SURG 15 STRL SS (BLADE) ×2
CABLE TEST STIMULATION (UROLOGICAL SUPPLIES) ×3 IMPLANT
CATH FOLEY 2WAY SLVR  5CC 16FR (CATHETERS) ×2
CATH FOLEY 2WAY SLVR 5CC 16FR (CATHETERS) ×1 IMPLANT
CLOSURE WOUND 1/2 X4 (GAUZE/BANDAGES/DRESSINGS)
CLOTH BEACON ORANGE TIMEOUT ST (SAFETY) ×3 IMPLANT
COVER MAYO STAND STRL (DRAPES) ×3 IMPLANT
COVER PROBE W GEL 5X96 (DRAPES) ×3 IMPLANT
COVER TABLE BACK 60X90 (DRAPES) ×3 IMPLANT
DERMABOND ADVANCED (GAUZE/BANDAGES/DRESSINGS) ×2
DERMABOND ADVANCED .7 DNX12 (GAUZE/BANDAGES/DRESSINGS) ×1 IMPLANT
DRAPE C-ARM 42X72 X-RAY (DRAPES) ×3 IMPLANT
DRAPE INCISE 23X17 IOBAN STRL (DRAPES) ×2
DRAPE INCISE IOBAN 23X17 STRL (DRAPES) ×1 IMPLANT
DRAPE INCISE IOBAN 66X45 STRL (DRAPES) ×3 IMPLANT
DRAPE LAPAROSCOPIC ABDOMINAL (DRAPES) ×3 IMPLANT
DRAPE LG THREE QUARTER DISP (DRAPES) ×3 IMPLANT
DRESSING TELFA ISLAND 4X8 (GAUZE/BANDAGES/DRESSINGS) IMPLANT
DRSG TEGADERM 2-3/8X2-3/4 SM (GAUZE/BANDAGES/DRESSINGS) ×3 IMPLANT
DRSG TEGADERM 4X4.75 (GAUZE/BANDAGES/DRESSINGS) ×3 IMPLANT
DRSG TELFA 3X8 NADH (GAUZE/BANDAGES/DRESSINGS) ×3 IMPLANT
ELECT REM PT RETURN 9FT ADLT (ELECTROSURGICAL) ×3
ELECTRODE REM PT RTRN 9FT ADLT (ELECTROSURGICAL) ×1 IMPLANT
GLOVE BIO SURGEON STRL SZ7.5 (GLOVE) ×3 IMPLANT
GLOVE BIOGEL M 6.5 STRL (GLOVE) ×3 IMPLANT
GLOVE BIOGEL M 7.0 STRL (GLOVE) ×3 IMPLANT
GLOVE BIOGEL PI IND STRL 6.5 (GLOVE) ×1 IMPLANT
GLOVE BIOGEL PI IND STRL 7.5 (GLOVE) ×4 IMPLANT
GLOVE BIOGEL PI INDICATOR 6.5 (GLOVE) ×2
GLOVE BIOGEL PI INDICATOR 7.5 (GLOVE) ×8
GOWN PREVENTION PLUS LG XLONG (DISPOSABLE) ×3 IMPLANT
GOWN STRL REIN XL XLG (GOWN DISPOSABLE) ×3 IMPLANT
GOWN STRL REUS W/TWL LRG LVL3 (GOWN DISPOSABLE) ×3 IMPLANT
GOWN STRL REUS W/TWL XL LVL3 (GOWN DISPOSABLE) ×6 IMPLANT
HOLDER FOLEY CATH W/STRAP (MISCELLANEOUS) ×3 IMPLANT
INTRODUCER GUIDE DILATR SHEATH (SET/KITS/TRAYS/PACK) ×3 IMPLANT
LEAD (Lead) ×3 IMPLANT
LIQUID BAND (GAUZE/BANDAGES/DRESSINGS) ×3 IMPLANT
NEEDLE FORAMEN 20GA 3.5  9CM (NEEDLE) IMPLANT
NEEDLE FORAMEN 20GA 5  12.5CM (NEEDLE) IMPLANT
NEEDLE HYPO 22GX1.5 SAFETY (NEEDLE) ×3 IMPLANT
PACK BASIN DAY SURGERY FS (CUSTOM PROCEDURE TRAY) ×3 IMPLANT
PENCIL BUTTON HOLSTER BLD 10FT (ELECTRODE) ×3 IMPLANT
PROGRAMMER ANTENNA EXT (UROLOGICAL SUPPLIES) ×3 IMPLANT
PROGRAMMER STIMUL 2.2X1.1X3.7 (UROLOGICAL SUPPLIES) ×3 IMPLANT
SLEEVE SURGEON STRL (DRAPES) ×3 IMPLANT
SPONGE GAUZE 4X4 12PLY STER LF (GAUZE/BANDAGES/DRESSINGS) IMPLANT
STAPLER VISISTAT 35W (STAPLE) IMPLANT
STIMULATOR INTERSTIM 2X1.7X.3 (Orthopedic Implant) ×3 IMPLANT
STRIP CLOSURE SKIN 1/2X4 (GAUZE/BANDAGES/DRESSINGS) IMPLANT
SUCTION FRAZIER TIP 10 FR DISP (SUCTIONS) ×3 IMPLANT
SUT SILK 2 0 (SUTURE) ×2
SUT SILK 2-0 18XBRD TIE 12 (SUTURE) ×1 IMPLANT
SUT VIC AB 3-0 SH 27 (SUTURE) ×4
SUT VIC AB 3-0 SH 27X BRD (SUTURE) ×2 IMPLANT
SUT VICRYL 4-0 PS2 18IN ABS (SUTURE) ×6 IMPLANT
SYR BULB IRRIGATION 50ML (SYRINGE) ×3 IMPLANT
SYR CONTROL 10ML LL (SYRINGE) ×3 IMPLANT
SYRINGE 10CC LL (SYRINGE) ×3 IMPLANT
TOWEL OR 17X24 6PK STRL BLUE (TOWEL DISPOSABLE) ×9 IMPLANT
TRAY DSU PREP LF (CUSTOM PROCEDURE TRAY) ×3 IMPLANT
TUBE CONNECTING 12'X1/4 (SUCTIONS) ×1
TUBE CONNECTING 12X1/4 (SUCTIONS) ×2 IMPLANT
WATER STERILE IRR 500ML POUR (IV SOLUTION) ×3 IMPLANT

## 2014-07-09 NOTE — Transfer of Care (Signed)
Immediate Anesthesia Transfer of Care Note  Patient: Kimberly Browning  Procedure(s) Performed: Procedure(s) (LRB): INTERSTIM IMPLANT FIRST STAGE (N/A) INTERSTIM IMPLANT SECOND STAGE (N/A)  Patient Location: PACU  Anesthesia Type: General  Level of Consciousness: awake, alert  and oriented  Airway & Oxygen Therapy: Patient Spontanous Breathing and Patient connected to nasal cannula oxygen Post-op Assessment: Report given to PACU RN and Post -op Vital signs reviewed and stable  Post vital signs: Reviewed and stable  Complications: No apparent anesthesia complications

## 2014-07-09 NOTE — Anesthesia Postprocedure Evaluation (Signed)
  Anesthesia Post-op Note  Patient: Kimberly Browning  Procedure(s) Performed: Procedure(s) (LRB): INTERSTIM IMPLANT FIRST STAGE (N/A) INTERSTIM IMPLANT SECOND STAGE (N/A)  Patient Location: PACU  Anesthesia Type: MAC  Level of Consciousness: awake and alert   Airway and Oxygen Therapy: Patient Spontanous Breathing  Post-op Pain: mild  Post-op Assessment: Post-op Vital signs reviewed, Patient's Cardiovascular Status Stable, Respiratory Function Stable, Patent Airway and No signs of Nausea or vomiting  Last Vitals:  Filed Vitals:   07/09/14 1230  BP: 114/68  Pulse: 76  Temp:   Resp: 16    Post-op Vital Signs: stable   Complications: No apparent anesthesia complications

## 2014-07-09 NOTE — Anesthesia Procedure Notes (Signed)
Procedure Name: MAC Date/Time: 07/09/2014 10:18 AM Performed by: Mechele Claude Pre-anesthesia Checklist: Patient identified, Timeout performed, Emergency Drugs available, Suction available and Patient being monitored Patient Re-evaluated:Patient Re-evaluated prior to inductionOxygen Delivery Method: Nasal cannula Intubation Type: IV induction Placement Confirmation: positive ETCO2

## 2014-07-09 NOTE — Discharge Instructions (Signed)
I have reviewed discharge instructions in detail with the patient. They will follow-up with me or their physician as scheduled. My nurse will also be calling the patients as per protocol.     Call your surgeon if you experience:   1.  Fever over 101.0. 2.  Inability to urinate. 3.  Nausea and/or vomiting. 4.  Extreme swelling or bruising at the surgical site. 5.  Continued bleeding from the incision. 6.  Increased pain, redness or drainage from the incision. 7.  Problems related to your pain medication. 8. Any change in color, movement and/or sensation 9. Any problems and/or concerns    Post Anesthesia Home Care Instructions  Activity: Get plenty of rest for the remainder of the day. A responsible adult should stay with you for 24 hours following the procedure.  For the next 24 hours, DO NOT: -Drive a car -Paediatric nurse -Drink alcoholic beverages -Take any medication unless instructed by your physician -Make any legal decisions or sign important papers.  Meals: Start with liquid foods such as gelatin or soup. Progress to regular foods as tolerated. Avoid greasy, spicy, heavy foods. If nausea and/or vomiting occur, drink only clear liquids until the nausea and/or vomiting subsides. Call your physician if vomiting continues.  Special Instructions/Symptoms: Your throat may feel dry or sore from the anesthesia or the breathing tube placed in your throat during surgery. If this causes discomfort, gargle with warm salt water. The discomfort should disappear within 24 hours.

## 2014-07-09 NOTE — Op Note (Signed)
Preoperative Diagnosis: Urge urinary incontinence  Postoperative Diagnosis:  same  Procedure(s) Performed:  1. Implantation of sacral neuromodulatory device, stage I and II. 2. Interpretation of fluorography <1hr.  Teaching Surgeon:  Bjorn Loser, MD  Resident Surgeon:  Marta Antu, MD  Assistant(s):  none  Anesthesia:  Sedation  Fluids:  See anesthesia record  UOP: unable to obtain  Estimated blood loss:  5 mL  Specimens:  none  Drains:  Foley, removed at end of case.  Complications:  none  Indications: 62yF with UUI who had >50% improvement in symptoms after office based first stage Interstim. After discussion of risks/benefits/alternative therapies, she elected for the above procedure.  Findings:   1. Successful implantation of leads in left S3 foramina with strong bellows and great toe dorsiflexion at low voltage in leads 2 and 3, but not 0 and 1.  Description:  The patient was correctly identified in the preop holding area where written informed consent as well potential risk and complication reviewed. She agreed. They were brought back to the operative suite where a preinduction timeout was performed. Once correct information was verified, general anesthesia was induced via endotracheal tube. SCDs were placed for VTE prophylaxis. Appropriate prophylactic perioperative antibiotics were instilled.  She was placed in prone position with all pressure points padded. A foley was placed. Her gluteal region was prepped with betadine and she was draped in sterile fashion. With fluoroscopy, we marked her left S3 foramen on the skin. 0.25% marcaine was injected at the skin and along the needle trajectory. A spinal needle was advanced until it passed through a foramen. Location in S4 was confirmed by AP and lateral fluoroscopy. A second needle was inserted cephalad to this, and position in the left S3 foramen was confirmed. Responses were excellent. The first needle was  removed. A wire was inserted and the needle removed. A 1cm incision was made in the skin. An 8Fr introducer sheath was advanced under fluoroscopic guidance to the midpoint of the bone table. The obturator and wire were removed. The implantable stimulator was advanced until the tines were nearly deployed. Leads 2 and 3 showed excellent motor response at low voltage, but leads 0 and 1 did not. We spent roughly 30 minutes trying to deploy the leads in a position that would engage 3 out of 4 leads, but even despite excellent fluoroscopic position, this was unsuccessful. We weighed the pros and cons of trying to reposition the obturator on the left, or moving to the right, but she had had symptomatic improvement in the office on the left, so ultimately we decided to go with 2 out of 4 leads positive in the left S3 foramen. The sheath was removed leaving the leads in place.  A spot for the battery was located in her left gluteal region, taking care to avoid bony prominences. A 5cm incision was made sharply, and developed with electrocautery. A pocket was developed with blunt dissection and electrocautery. A sharp trocar was passed from the medial incision to the lateral incision through the subcutaneous fatty layer, and allowed passage of the wire. The wire was attached to the battery, and the battery was buried in the subcutaneous space. Impedence was low. The medial skin incision was closed with interrupted 4-0 vicryl. The lateral skin incision was closed with running 3-0 vicryl in a subdermal layer, and 4-0 vicryl running subcuticular. Tegaderm was applied, the foley was removed, and the patient was taken to the recovery room having tolerated the procedure well.

## 2014-07-09 NOTE — Interval H&P Note (Signed)
History and Physical Interval Note:  07/09/2014 8:19 AM  Kimberly Browning  has presented today for surgery, with the diagnosis of URGENT INCONTINENCE  The various methods of treatment have been discussed with the patient and family. After consideration of risks, benefits and other options for treatment, the patient has consented to  Procedure(s): INTERSTIM IMPLANT FIRST STAGE (N/Browning) INTERSTIM IMPLANT SECOND STAGE (N/Browning) as Browning surgical intervention .  The patient's history has been reviewed, patient examined, no change in status, stable for surgery.  I have reviewed the patient's chart and labs.  Questions were answered to the patient's satisfaction.     Kimberly Browning

## 2014-07-10 ENCOUNTER — Other Ambulatory Visit (HOSPITAL_COMMUNITY): Payer: Self-pay | Admitting: Urology

## 2014-07-10 ENCOUNTER — Encounter (HOSPITAL_BASED_OUTPATIENT_CLINIC_OR_DEPARTMENT_OTHER): Payer: Self-pay | Admitting: Urology

## 2014-07-10 ENCOUNTER — Ambulatory Visit (HOSPITAL_COMMUNITY)
Admission: RE | Admit: 2014-07-10 | Discharge: 2014-07-10 | Disposition: A | Discharge status: Home / Self Care | Payer: Medicare Other | Source: Ambulatory Visit | Attending: Urology | Admitting: Urology

## 2014-07-10 DIAGNOSIS — R52 Pain, unspecified: Secondary | ICD-10-CM | POA: Insufficient documentation

## 2014-07-10 DIAGNOSIS — Z978 Presence of other specified devices: Secondary | ICD-10-CM | POA: Insufficient documentation

## 2014-07-11 LAB — POCT PREGNANCY, URINE: PREG TEST UR: NEGATIVE

## 2014-07-22 NOTE — Op Note (Signed)
Preoperative Diagnosis: Urge urinary incontinence  Postoperative Diagnosis: same  Procedure(s) Performed:  1. Implantation of sacral neuromodulatory device, stage I and II. 2. Interpretation of fluorography <1hr.  Teaching Surgeon: Bjorn Loser, MD  Resident Surgeon: Marta Antu, MD  Assistant(s): none  Anesthesia: Sedation  Fluids: See anesthesia record  UOP: unable to obtain  Estimated blood loss: 5 mL  Specimens: none  Drains: Foley, removed at end of case.  Complications: none  Indications: 62yF with UUI who had >50% improvement in symptoms after office based first stage Interstim. After discussion of risks/benefits/alternative therapies, she elected for the above procedure.  Findings:  1. Successful implantation of leads in left S3 foramina with strong bellows and great toe dorsiflexion at low voltage in leads 2 and 3, but not 0 and 1.  Description: The patient was correctly identified in the preop holding area where written informed consent as well potential risk and complication reviewed. She agreed. They were brought back to the operative suite where a preinduction timeout was performed. Once correct information was verified, general anesthesia was induced via endotracheal tube. SCDs were placed for VTE prophylaxis. Appropriate prophylactic perioperative antibiotics were instilled.  She was placed in prone position with all pressure points padded. A foley was placed. Her gluteal region was prepped with betadine and she was draped in sterile fashion. With fluoroscopy, we marked her left S3 foramen on the skin. 0.25% marcaine was injected at the skin and along the needle trajectory. A spinal needle was advanced until it passed through a foramen. Location in S4 was confirmed by AP and lateral fluoroscopy. A second needle was inserted cephalad to this, and position in the left S3 foramen was confirmed. Responses were excellent. The first needle was  removed. A wire was inserted and the needle removed. A 1cm incision was made in the skin. An 8Fr introducer sheath was advanced under fluoroscopic guidance to the midpoint of the bone table. The obturator and wire were removed. The implantable stimulator was advanced until the tines were nearly deployed. Leads 2 and 3 showed excellent motor response at low voltage, but leads 0 and 1 did not. We spent roughly 30 minutes trying to deploy the leads in a position that would engage 3 out of 4 leads, but even despite excellent fluoroscopic position, this was unsuccessful. We weighed the pros and cons of trying to reposition the obturator on the left, or moving to the right, but she had had symptomatic improvement in the office on the left, so ultimately we decided to go with 2 out of 4 leads positive in the left S3 foramen. The sheath was removed leaving the leads in place.  A spot for the battery was located in her left gluteal region, taking care to avoid bony prominences. A 5cm incision was made sharply, and developed with electrocautery. A pocket was developed with blunt dissection and electrocautery. A sharp trocar was passed from the medial incision to the lateral incision through the subcutaneous fatty layer, and allowed passage of the wire. The wire was attached to the battery, and the battery was buried in the subcutaneous space. Impedence was low. The medial skin incision was closed with interrupted 4-0 vicryl. The lateral skin incision was closed with running 3-0 vicryl in a subdermal layer, and 4-0 vicryl running subcuticular. Tegaderm was applied, the foley was removed, and the patient was taken to the recovery room having tolerated the procedure well.  i was present for the entire operation and agree with the dictation  above i also tested the impedance of all 4 leads at the end of the case using sterile technique and they were norml

## 2015-06-20 ENCOUNTER — Other Ambulatory Visit: Payer: Self-pay | Admitting: Urology

## 2015-07-14 ENCOUNTER — Encounter (HOSPITAL_BASED_OUTPATIENT_CLINIC_OR_DEPARTMENT_OTHER): Payer: Self-pay | Admitting: *Deleted

## 2015-07-14 NOTE — Progress Notes (Signed)
NPO AFTER MN. ARRIVE AT 1000.  NEEDS HG AND URINE PREG.

## 2015-07-21 NOTE — H&P (Signed)
History of Present Illness   [name] has had Interstim in February 2016. Her frequency had decreased from 40 times a day to 17 times a day and nocturia was moderately improved and she had much less urgency. When I saw her in February some troubleshooting was done. Belenda Cruise saw her on 09/25/2014 and some troubleshooting was done. Impedance was normal. She wanted her to cycle through each program for 2 weeks. She was feeling vaginal and rectal sensation appropriately. She was to follow up in 6 weeks    [name] in October 2016 was having some rectal discomfort. She has had troubleshooting in the past.   Frequency: Stable.   Urinalysis: Negative.   [name] still has her frequency and urgency. She is wanting to get pregnant and we talked about removing the device. It is really not helping her. I thought this was very reasonable.   The incisions look fine.   I went over the case in detail.    Past Medical History Problems  1. History of Anxiety (F41.9) 2. History of depression (Z86.59) 3. History of esophageal reflux (Z87.19) 4. History of Retinitis pigmentosa (H35.52)  Surgical History Problems  1. History of Install Sacral Nerve Neurostimulator By Incision 2. History of Peripheral Neurostimulator Placement Of Pulse Generator  Current Meds 1. Benadryl TABS;  Therapy: (Recorded:07Aug2015) to Recorded 2. Cephalexin 500 MG Oral Capsule; TAKE 1 CAPSULE 3 times daily;  Therapy: [id number] to (Evaluate:19Dec2015)  Requested for: [id number]; Last  Rx:16Dec2015 Ordered 3. Ciprofloxacin HCl - 250 MG Oral Tablet; TAKE 2 TABLET Daily;  Therapy: [id number] to (Evaluate:17May2016)  Requested for: [id number]; Last  Rx:10May2016 Ordered 4. Iron TABS;  Therapy: (Recorded:07Aug2015) to Recorded 5. Pristiq 100 MG Oral Tablet Extended Release 24 Hour;  Therapy: [id number] to Recorded  Allergies Medication  1. Depakote 2. Geodon 3. Toviaz TB24 Non-Medication  4. Tape  Family  History Problems  1. Family history of Medical history non-contributory   patient was adopted 2. No pertinent family history : Father  Social History Problems  1. Denied: History of Alcohol use 2. Current every day smoker (F17.200) 3. Former cigarette smoker [phone])   2 packs a week for 13 years, quit 10/03 - 6/04 as of 01/11/14 office visit. 4. No caffeine use 5. One child   daughter  Results/Data  Urine [Data Includes: Last 1 Day]   [id number]  COLOR YELLOW   APPEARANCE CLEAR   SPECIFIC GRAVITY 1.025   pH 5.5   GLUCOSE NEGATIVE   BILIRUBIN NEGATIVE   KETONE NEGATIVE   BLOOD NEGATIVE   PROTEIN NEGATIVE   NITRITE NEGATIVE   LEUKOCYTE ESTERASE NEGATIVE    Assessment Assessed  1. Increased urinary frequency (R35.0) 2. Urge and stress incontinence (N39.46)  Plan Urge and stress incontinence  1. Follow-up Schedule Surgery Office  Follow-up  Status: Complete  Done: [id number]  Discussion/Summary   The pros, cons, and risks described. Retained tip described. She was clearly instructed if she gets pregnant in the meantime, to call us and we would have to cancel the surgery.   According to her, her obstetrician team thinks the device should be removed.   After a thorough review of the management options for the patient's condition the patient  elected to proceed with surgical therapy as noted above. We have discussed the potential benefits and risks of the procedure, side effects of the proposed treatment, the likelihood of the patient achieving the goals of the procedure, and any potential problems that might occur during the  procedure or recuperation. Informed consent has been obtained.

## 2015-07-22 ENCOUNTER — Ambulatory Visit (HOSPITAL_BASED_OUTPATIENT_CLINIC_OR_DEPARTMENT_OTHER)
Admission: RE | Admit: 2015-07-22 | Discharge: 2015-07-22 | Disposition: A | Discharge status: Home / Self Care | Payer: Medicare Other | Source: Ambulatory Visit | Attending: Urology | Admitting: Urology

## 2015-07-22 ENCOUNTER — Ambulatory Visit (HOSPITAL_BASED_OUTPATIENT_CLINIC_OR_DEPARTMENT_OTHER): Payer: Medicare Other | Admitting: Anesthesiology

## 2015-07-22 ENCOUNTER — Encounter (HOSPITAL_BASED_OUTPATIENT_CLINIC_OR_DEPARTMENT_OTHER): Payer: Self-pay | Admitting: *Deleted

## 2015-07-22 ENCOUNTER — Encounter (HOSPITAL_BASED_OUTPATIENT_CLINIC_OR_DEPARTMENT_OTHER)
Admission: RE | Disposition: A | Discharge status: Home / Self Care | Payer: Self-pay | Source: Ambulatory Visit | Attending: Urology

## 2015-07-22 DIAGNOSIS — Z79899 Other long term (current) drug therapy: Secondary | ICD-10-CM | POA: Insufficient documentation

## 2015-07-22 DIAGNOSIS — K219 Gastro-esophageal reflux disease without esophagitis: Secondary | ICD-10-CM | POA: Insufficient documentation

## 2015-07-22 DIAGNOSIS — Z87891 Personal history of nicotine dependence: Secondary | ICD-10-CM | POA: Diagnosis not present

## 2015-07-22 DIAGNOSIS — Z538 Procedure and treatment not carried out for other reasons: Secondary | ICD-10-CM | POA: Insufficient documentation

## 2015-07-22 DIAGNOSIS — N3946 Mixed incontinence: Secondary | ICD-10-CM | POA: Insufficient documentation

## 2015-07-22 DIAGNOSIS — O26899 Other specified pregnancy related conditions, unspecified trimester: Secondary | ICD-10-CM | POA: Insufficient documentation

## 2015-07-22 DIAGNOSIS — F329 Major depressive disorder, single episode, unspecified: Secondary | ICD-10-CM | POA: Diagnosis not present

## 2015-07-22 HISTORY — DX: Feeling of incomplete bladder emptying: R39.14

## 2015-07-22 HISTORY — DX: Xerosis cutis: L85.3

## 2015-07-22 HISTORY — DX: Nocturia: R35.1

## 2015-07-22 LAB — POCT HEMOGLOBIN-HEMACUE: Hemoglobin: 12.6 g/dL (ref 12.0–15.0)

## 2015-07-22 LAB — POCT PREGNANCY, URINE: PREG TEST UR: POSITIVE — AB

## 2015-07-22 SURGERY — REMOVAL, NEUROSTIMULATOR, SACRAL
Anesthesia: General

## 2015-07-22 MED ORDER — CEFAZOLIN SODIUM-DEXTROSE 2-3 GM-% IV SOLR
INTRAVENOUS | Status: AC
  Filled 2015-07-22: qty 50

## 2015-07-22 MED ORDER — CEFAZOLIN SODIUM-DEXTROSE 2-3 GM-% IV SOLR
2.0000 g | INTRAVENOUS | Status: DC
  Filled 2015-07-22: qty 50

## 2015-07-22 MED ORDER — MIDAZOLAM HCL 2 MG/2ML IJ SOLN
INTRAMUSCULAR | Status: AC
  Filled 2015-07-22: qty 2

## 2015-07-22 MED ORDER — LIDOCAINE HCL (CARDIAC) 20 MG/ML IV SOLN
INTRAVENOUS | Status: AC
  Filled 2015-07-22: qty 10

## 2015-07-22 MED ORDER — LACTATED RINGERS IV SOLN
INTRAVENOUS | Status: DC
  Administered 2015-07-22: 11:00:00 via INTRAVENOUS
  Filled 2015-07-22: qty 1000

## 2015-07-22 MED ORDER — CEFAZOLIN SODIUM 1-5 GM-% IV SOLN
1.0000 g | INTRAVENOUS | Status: DC
  Filled 2015-07-22: qty 50

## 2015-07-22 SURGICAL SUPPLY — 37 items
BLADE SURG 15 STRL LF DISP TIS (BLADE) IMPLANT
BLADE SURG 15 STRL SS (BLADE)
CLOTH BEACON ORANGE TIMEOUT ST (SAFETY) IMPLANT
COVER BACK TABLE 60X90IN (DRAPES) IMPLANT
COVER MAYO STAND STRL (DRAPES) IMPLANT
DRAPE C-ARM 42X72 X-RAY (DRAPES) IMPLANT
DRAPE INCISE 23X17 IOBAN STRL (DRAPES)
DRAPE INCISE IOBAN 23X17 STRL (DRAPES) IMPLANT
DRAPE LAPAROSCOPIC ABDOMINAL (DRAPES) IMPLANT
DRSG TEGADERM 4X4.75 (GAUZE/BANDAGES/DRESSINGS) IMPLANT
DRSG TELFA 3X8 NADH (GAUZE/BANDAGES/DRESSINGS) IMPLANT
ELECT REM PT RETURN 9FT ADLT (ELECTROSURGICAL)
ELECTRODE REM PT RTRN 9FT ADLT (ELECTROSURGICAL) IMPLANT
GAUZE SPONGE 4X4 12PLY STRL LF (GAUZE/BANDAGES/DRESSINGS) IMPLANT
GLOVE BIO SURGEON STRL SZ7.5 (GLOVE) IMPLANT
GOWN STRL REUS W/ TWL LRG LVL3 (GOWN DISPOSABLE) IMPLANT
GOWN STRL REUS W/ TWL XL LVL3 (GOWN DISPOSABLE) IMPLANT
GOWN STRL REUS W/TWL LRG LVL3 (GOWN DISPOSABLE)
GOWN STRL REUS W/TWL XL LVL3 (GOWN DISPOSABLE)
KIT ROOM TURNOVER WOR (KITS) IMPLANT
LIQUID BAND (GAUZE/BANDAGES/DRESSINGS) IMPLANT
MANIFOLD NEPTUNE II (INSTRUMENTS) IMPLANT
NEEDLE HYPO 22GX1.5 SAFETY (NEEDLE) IMPLANT
NS IRRIG 500ML POUR BTL (IV SOLUTION) IMPLANT
PACK BASIN DAY SURGERY FS (CUSTOM PROCEDURE TRAY) IMPLANT
PENCIL BUTTON HOLSTER BLD 10FT (ELECTRODE) IMPLANT
STAPLER VISISTAT 35W (STAPLE) IMPLANT
SUT VIC AB 2-0 UR5 27 (SUTURE) IMPLANT
SUT VIC AB 3-0 SH 27 (SUTURE)
SUT VIC AB 3-0 SH 27X BRD (SUTURE) IMPLANT
SUT VICRYL 4-0 PS2 18IN ABS (SUTURE) IMPLANT
SYR BULB IRRIGATION 50ML (SYRINGE) IMPLANT
SYR CONTROL 10ML LL (SYRINGE) IMPLANT
TOWEL OR 17X24 6PK STRL BLUE (TOWEL DISPOSABLE) IMPLANT
TRAY DSU PREP LF (CUSTOM PROCEDURE TRAY) IMPLANT
TUBE CONNECTING 12X1/4 (SUCTIONS) IMPLANT
WATER STERILE IRR 500ML POUR (IV SOLUTION) IMPLANT

## 2015-07-22 NOTE — Progress Notes (Signed)
Dr. Matilde Sprang SPOKE WITH THE PATIENT TO TELL HER THAT SHE WAS PREGNANT AND SURGERY WAS CANCELLED.

## 2015-07-22 NOTE — Interval H&P Note (Signed)
History and Physical Interval Note:  07/22/2015 7:15 AM  [name]  has presented today for surgery, with the diagnosis of MALFUNCTION INTERSTERIM  The various methods of treatment have been discussed with the patient and family. After consideration of risks, benefits and other options for treatment, the patient has consented to  Procedure(s): REMOVAL OF INTERSTIM IMPLANT (N/A) as a surgical intervention .  The patient's history has been reviewed, patient examined, no change in status, stable for surgery.  I have reviewed the patient's chart and labs.  Questions were answered to the patient's satisfaction.     Brennyn Haisley A

## 2015-07-22 NOTE — H&P (View-Only) (Signed)
NPO AFTER MN. ARRIVE AT 1000.  NEEDS HG AND URINE PREG.

## 2015-12-25 ENCOUNTER — Other Ambulatory Visit: Payer: Self-pay | Admitting: Certified Nurse Midwife

## 2015-12-26 ENCOUNTER — Other Ambulatory Visit: Payer: Self-pay | Admitting: Certified Nurse Midwife

## 2016-01-29 DIAGNOSIS — K219 Gastro-esophageal reflux disease without esophagitis: Secondary | ICD-10-CM | POA: Insufficient documentation

## 2016-08-10 ENCOUNTER — Ambulatory Visit: Payer: Medicare Other | Admitting: Obstetrics and Gynecology

## 2017-11-10 ENCOUNTER — Other Ambulatory Visit: Payer: Self-pay | Admitting: Physician Assistant

## 2017-11-10 DIAGNOSIS — Z1231 Encounter for screening mammogram for malignant neoplasm of breast: Secondary | ICD-10-CM

## 2017-11-29 ENCOUNTER — Ambulatory Visit: Payer: Medicare Other

## 2017-12-27 ENCOUNTER — Ambulatory Visit: Payer: Medicare Other

## 2017-12-28 ENCOUNTER — Ambulatory Visit: Payer: Medicare Other | Admitting: Neurology

## 2017-12-28 ENCOUNTER — Telehealth: Payer: Self-pay

## 2017-12-28 NOTE — Telephone Encounter (Signed)
Pt did not show for their appt with Dr. Athar today.  

## 2017-12-29 ENCOUNTER — Encounter: Payer: Self-pay | Admitting: Neurology

## 2018-03-06 ENCOUNTER — Ambulatory Visit (INDEPENDENT_AMBULATORY_CARE_PROVIDER_SITE_OTHER): Payer: Medicare Other | Admitting: Neurology

## 2018-03-06 ENCOUNTER — Encounter

## 2018-03-06 ENCOUNTER — Encounter: Payer: Self-pay | Admitting: Neurology

## 2018-03-06 ENCOUNTER — Telehealth: Payer: Self-pay | Admitting: Neurology

## 2018-03-06 VITALS — BP 133/87 | HR 88 | Ht 64.0 in | Wt 185.0 lb

## 2018-03-06 DIAGNOSIS — H81399 Other peripheral vertigo, unspecified ear: Secondary | ICD-10-CM

## 2018-03-06 DIAGNOSIS — R42 Dizziness and giddiness: Secondary | ICD-10-CM

## 2018-03-06 NOTE — Patient Instructions (Addendum)
Your neurological exam is non-focal. Please remember, that vertigo can recur without warning, triggers could be stress, sleep deprivation, dehydration and even taking a bumpy car ride, sometimes an acute illness, even a common (viral) cold. It can last hours or days. Please change positions slowly and always stay well-hydrated. Physical therapy with particular attention to vestibular rehabilitation can be very helpful. Unfortunately, there is no specific medication for vertigo.   Please talk to your primary care about doing physical therapy and seeing an ENT (ear, nose and throat specialist). We will do a brain scan, called MRI and call you with the test results. We will have to schedule you for this on a separate date. This test requires authorization from your insurance, and we will take care of the insurance process. We will check blood work today for kidney and liver function.  So long as your MRI is non-revealing, I can see you back as needed.

## 2018-03-06 NOTE — Telephone Encounter (Signed)
Medicare/medicaid order sent to GI. No auth they will reach out to the pt to schedule.  °

## 2018-03-06 NOTE — Progress Notes (Signed)
Subjective:    Patient ID: Kimberly Browning is a 42 y.o. female.  HPI     Kimberly Age, MD, PhD [facility] [address].O. [place], [place] [address]  Dear Kimberly Browning,  I saw your patient, Kimberly Browning, upon your kind request in my neurologic clinic today for initial consultation of her vertigo. The patient is unaccompanied today. Of note, she no showed for an appointment on 12/28/2017. As you know, Kimberly Browning is a 42 year old right-handed woman with an underlying medical history of osteoarthritis, seasonal allergies, restless leg syndrome, reflux disease, depression, hyperlipidemia, vitamin D deficiency, and obesity, who reports recurrent vertigo symptoms for years. She also reports hearing loss but is unsure how long she has had it at which ear. She also reports recurrent tinnitus in both ears, unclear how long she has had it. I reviewed your office note from 12/23/2017, which you kindly included. She has had symptoms of restless leg syndrome and was started on ropinirole. She was also given a prescription for Benadryl for as needed use for her vertigo. She does not take the Benadryl any longer. She does not take the Requip any longer due to side effects. She has been on doxepin for sleep for years as I understand. She lives alone, her 36 year old daughter lives with paternal grandparents. She smokes about one carton per month, does not utilize alcohol or caffeine on a regular basis. She has not had an MRI brain. She has not had physical therapy, she has not seen ENT. She had a head CT without contrast in the past on 11/12/2009 and I reviewed the results. IMPRESSION:   1. No specific intracranial abnormality is identified. (Indication: headache and dizziness).  Her Past Medical History Is Significant For: Past Medical History:  Diagnosis Date  . Bipolar disorder ([place])   . Depression   . Dry skin    hands  . Feeling of incomplete bladder emptying   .  GERD (gastroesophageal reflux disease)   . Hypercholesteremia   . Nocturia   . Urge urinary incontinence   . Vitamin D deficiency   . Wears contact lenses     Her Past Surgical History Is Significant For: Past Surgical History:  Procedure Laterality Date  . BREAST REDUCTION SURGERY  05/10/2011   Procedure: MAMMARY REDUCTION BILATERAL (BREAST);  Surgeon: Hermelinda Dellen;  Location: [facility];  Service: Plastics;  Laterality: Bilateral;  bilateral breast reduction  . DILATION AND EVACUATION  12/26/2008  . INTERSTIM IMPLANT PLACEMENT N/A 07/09/2014   Procedure: Barrie Lyme IMPLANT FIRST STAGE;  Surgeon: Reece Packer, MD;  Location: [facility];  Service: Urology;  Laterality: N/A;  . INTERSTIM IMPLANT PLACEMENT N/A 07/09/2014   Procedure: Barrie Lyme IMPLANT SECOND STAGE;  Surgeon: Reece Packer, MD;  Location: [facility];  Service: Urology;  Laterality: N/A;  . INTRAUTERINE DEVICE (IUD) INSERTION  sept 2011   mi[facility]  . WISDOM TOOTH EXTRACTION  as a teenager    Her Family History Is Significant For: No family history on file.  Her Social History Is Significant For: Social History   Socioeconomic History  . Marital status: Divorced    Spouse name: Not on file  . Number of children: Not on file  . Years of education: Not on file  . Highest education level: Not on file  Occupational History  . Not on file  Social Needs  . Financial resource strain: Not on file  . Food insecurity:  Worry: Not on file    Inability: Not on file  . Transportation needs:    Medical: Not on file    Non-medical: Not on file  Tobacco Use  . Smoking status: Current Every Day Smoker    Packs/day: 0.25    Years: 19.00    Pack years: 4.75    Types: Cigarettes  . Smokeless tobacco: Never Used  . Tobacco comment:  1 pp3d  Substance and Sexual Activity  . Alcohol use: No  . Drug use: No  . Sexual activity: Yes    Birth control/protection: None   Lifestyle  . Physical activity:    Days per week: Not on file    Minutes per session: Not on file  . Stress: Not on file  Relationships  . Social connections:    Talks on phone: Not on file    Gets together: Not on file    Attends religious service: Not on file    Active member of club or organization: Not on file    Attends meetings of clubs or organizations: Not on file    Relationship status: Not on file  Other Topics Concern  . Not on file  Social History Narrative  . Not on file    Her Allergies Are:  Allergies  Allergen Reactions  . Geodon [Ziprasidone Hydrochloride] Shortness Of Breath and Swelling  . Hibiclens [Chlorhexidine] Itching    Redness and itching after using CHG wipes  . Doxycycline   . Gabapentin   . Other Other (See Comments)    Bath and body works body wash and lotion caused itching, reddness and rash  . Penicillins   . Promethazine   . Depakote [Divalproex Sodium] Rash  :   Her Current Medications Are:  Outpatient Encounter Medications as of 03/06/2018  Medication Sig  . desvenlafaxine (PRISTIQ) 50 MG 24 hr tablet Take 50 mg by mouth daily.  Marland Kitchen doxepin (SINEQUAN) 150 MG capsule Take 300 mg by mouth at bedtime.   . Liraglutide -Weight Management (SAXENDA) 18 MG/3ML SOPN Inject into the skin.  . ranitidine (ZANTAC) 300 MG tablet Take 300 mg by mouth at bedtime.  . [DISCONTINUED] desvenlafaxine (PRISTIQ) 100 MG 24 hr tablet Take 100 mg by mouth every evening.   . [DISCONTINUED] Diethylpropion HCl CR 75 MG TB24 Take by mouth.  . [DISCONTINUED] diphenhydrAMINE (BENADRYL) 25 MG tablet Take 25 mg by mouth at bedtime as needed for sleep.  . [DISCONTINUED] ferrous fumarate (HEMOCYTE - 106 MG FE) 325 (106 FE) MG TABS tablet Take 1 tablet by mouth daily.  . [DISCONTINUED] Omega-3 Fatty Acids (FISH OIL) 500 MG CAPS Take by mouth.   No facility-administered encounter medications on file as of 03/06/2018.   : Review of Systems:  Out of a complete 14 point  review of systems, all are reviewed and negative with the exception of these symptoms as listed below:  Review of Systems  Neurological:       Patient reports that she has had a problem with vertigo for many years but it has gotten progressively worse.     Objective:  Neurological Exam  Physical Exam Physical Examination:   Vitals:   03/06/18 0955  BP: 133/87  Pulse: 88   On orthostatic testing: lying blood pressure and pulse 139/87 with a pulse of 86, sitting 130/88 with a pulse of 88, standing 126/85 with a pulse of 93. No significant orthostatic lightheadedness reported.   General Examination: The patient is a very pleasant 42 y.o. female  in no acute distress. She appears well-developed and well-nourished and well groomed.   HEENT: Normocephalic, atraumatic, pupils are equal, round and reactive to light and accommodation. Funduscopic exam is nonfocal, she has corrective eyeglasses. Hearing is grossly intact, tympanic membranes are clear bilaterally. She has no vertiginous symptoms currently upon changes in head or neck position. Face is symmetric with normal facial animation and normal facial sensation. Speech is clear with no dysarthria noted. There is no hypophonia. There is no lip, neck/head, jaw or voice tremor. Neck is supple with full range of passive and active motion. There are no carotid bruits on auscultation. Oropharynx exam reveals: moderate mouth dryness, adequate dental hygiene. Tongue protrudes centrally and palate elevates symmetrically.   Chest: Clear to auscultation without wheezing, rhonchi or crackles noted.  Heart: S1+S2+0, regular and normal without murmurs, rubs or gallops noted.   Abdomen: Soft, non-tender and non-distended with normal bowel sounds appreciated on auscultation.  Extremities: There is no pitting edema in the distal lower extremities bilaterally. Pedal pulses are intact.  Skin: Warm and very dry.  Musculoskeletal: exam reveals no obvious  joint deformities, tenderness or joint swelling or erythema.   Neurologically:  Mental status: The patient is awake, alert and oriented in all 4 spheres. Her immediate and remote memory, attention, language skills and fund of knowledge are appropriate. There is no evidence of aphasia, agnosia, apraxia or anomia. Speech is clear with normal prosody and enunciation. Thought process is linear. Mood is normal and affect is normal.  Cranial nerves II - XII are as described above under HEENT exam. In addition: shoulder shrug is normal with equal shoulder height noted. Motor exam: Normal bulk, strength and tone is noted. There is no drift, tremor or rebound. Romberg is negative. Reflexes are 2+ throughout. Babinski: Toes are flexor bilaterally. Fine motor skills and coordination: intact with normal finger taps, normal hand movements, normal rapid alternating patting, normal foot taps and normal foot agility.  Cerebellar testing: No dysmetria or intention tremor on finger to nose testing. Heel to shin is unremarkable bilaterally. There is no truncal or gait ataxia.  Sensory exam: intact to light touch, vibration, temperature sense in the upper and lower extremities.  Gait, station and balance: She stands easily. No veering to one side is noted. No leaning to one side is noted. Posture is Browning-appropriate and stance is narrow based. Gait shows normal stride length and normal pace. No problems turning are noted. Tandem walk is Slightly challenging. Her toes pointed outwards when she does tandem walk.               Assessment and Plan:   In summary, ARIYEL JEANGILLES is a very pleasant 42 y.o.-year old female with an underlying medical history of osteoarthritis, seasonal allergies, restless leg syndrome, reflux disease, depression, hyperlipidemia, vitamin D deficiency, and obesity, who presents for evaluation of her longer standing history of recurrent vertigo. Neurological exam is nonfocal. She is reassured in that  regard. We will proceed with a brain MRI with and without contrast. She is advised that we will call her with her test results and so long as the MRI is nonrevealing she can follow-up with me on an as-needed basis. Nevertheless, she is advised to follow-up with you to talk about potentially seeing an ENT what with her history of hearing loss and tinnitus and recurrent vertigo symptoms. She is also advised that she may benefit from physical therapy for vestibular rehabilitation, she can talk to you about a referral as  well. She is advised that unfortunately there is no specific medication that is available for vertigo. She is encouraged to stop smoking, stay well-hydrated with water and change positions slowly. Of note, her skin is quite dry and her oropharynx exam shows significant mouth dryness. She also had a little bit of orthostatic blood pressure drop in the systolic number but no significant orthostatic symptoms. I answered all her questions today and she was in agreement. Thank you very much for allowing me to participate in the care of this nice patient. If I can be of any further assistance to you please do not hesitate to call me at [phone].  Sincerely,   Kimberly Age, MD, PhD

## 2018-03-06 NOTE — Addendum Note (Signed)
Addended by: Lester Northdale A on: 03/06/2018 11:04 AM   Modules accepted: Orders

## 2018-03-07 LAB — COMPREHENSIVE METABOLIC PANEL
ALBUMIN: 4.1 g/dL (ref 3.5–5.5)
ALK PHOS: 104 IU/L (ref 39–117)
ALT: 30 IU/L (ref 0–32)
AST: 27 IU/L (ref 0–40)
Albumin/Globulin Ratio: 2.1 (ref 1.2–2.2)
BUN / CREAT RATIO: 8 — AB (ref 9–23)
BUN: 7 mg/dL (ref 6–24)
Bilirubin Total: 0.2 mg/dL (ref 0.0–1.2)
CO2: 22 mmol/L (ref 20–29)
CREATININE: 0.91 mg/dL (ref 0.57–1.00)
Calcium: 9.3 mg/dL (ref 8.7–10.2)
Chloride: 102 mmol/L (ref 96–106)
GFR calc non Af Amer: 79 mL/min/{1.73_m2} (ref >59)
GFR, EST AFRICAN AMERICAN: 91 mL/min/{1.73_m2} (ref >59)
GLOBULIN, TOTAL: 2 g/dL (ref 1.5–4.5)
GLUCOSE: 73 mg/dL (ref 65–99)
Potassium: 3.8 mmol/L (ref 3.5–5.2)
SODIUM: 142 mmol/L (ref 134–144)
TOTAL PROTEIN: 6.1 g/dL (ref 6.0–8.5)

## 2018-03-10 ENCOUNTER — Ambulatory Visit
Admission: RE | Admit: 2018-03-10 | Discharge: 2018-03-10 | Disposition: A | Discharge status: Home / Self Care | Payer: Medicare Other | Source: Ambulatory Visit | Attending: Physician Assistant | Admitting: Physician Assistant

## 2018-03-10 DIAGNOSIS — Z1231 Encounter for screening mammogram for malignant neoplasm of breast: Secondary | ICD-10-CM

## 2018-03-18 ENCOUNTER — Inpatient Hospital Stay: Admission: RE | Admit: 2018-03-18 | Payer: Medicare Other | Source: Ambulatory Visit

## 2018-04-17 ENCOUNTER — Ambulatory Visit
Admission: RE | Admit: 2018-04-17 | Discharge: 2018-04-17 | Disposition: A | Discharge status: Home / Self Care | Payer: Medicare Other | Source: Ambulatory Visit | Attending: Neurology | Admitting: Neurology

## 2018-04-17 DIAGNOSIS — H81399 Other peripheral vertigo, unspecified ear: Secondary | ICD-10-CM | POA: Diagnosis not present

## 2018-04-17 MED ORDER — GADOBENATE DIMEGLUMINE 529 MG/ML IV SOLN
17.0000 mL | Freq: Once | INTRAVENOUS | Status: AC | PRN
  Administered 2018-04-17: 17 mL via INTRAVENOUS

## 2018-04-19 ENCOUNTER — Telehealth: Payer: Self-pay

## 2018-04-19 NOTE — Telephone Encounter (Signed)
I called pt to discuss her MRI results. No answer, left a message asking her to call me back. 

## 2018-04-19 NOTE — Telephone Encounter (Signed)
-----   Message from [name], MD sent at 04/19/2018  7:37 AM EST ----- Please call and advise the patient that the recent scan we did was within normal limits. We did a brain MRI with and wo contrast, which showed normal for age findings. In particular, there were no acute findings, such as a stroke, or mass or blood products. No further action is required on this test at this time.  She can FU with PCP. [name], MD, PhD

## 2018-04-19 NOTE — Progress Notes (Signed)
Please call and advise the patient that the recent scan we did was within normal limits. We did a brain MRI with and wo contrast, which showed normal for age findings. In particular, there were no acute findings, such as a stroke, or mass or blood products. No further action is required on this test at this time.  She can FU with PCP. [name], MD, PhD

## 2018-04-19 NOTE — Telephone Encounter (Signed)
Pt returned my call. I advised pt of her MRI results. Pt asked that I send a copy of her MRI results to [facility]. Pt will follow up with her PCP at [facility]. Pt verbalized understanding of results. Pt had no questions at this time but was encouraged to call back if questions arise.

## 2018-07-04 ENCOUNTER — Institutional Professional Consult (permissible substitution): Payer: Medicare Other | Admitting: Internal Medicine

## 2018-07-10 ENCOUNTER — Institutional Professional Consult (permissible substitution): Payer: Medicare Other | Admitting: Internal Medicine

## 2018-07-17 ENCOUNTER — Institutional Professional Consult (permissible substitution): Payer: Medicare Other | Admitting: Internal Medicine

## 2018-11-01 ENCOUNTER — Encounter: Payer: Self-pay | Admitting: Nurse Practitioner

## 2019-05-15 ENCOUNTER — Other Ambulatory Visit (HOSPITAL_COMMUNITY): Payer: Medicare Other

## 2019-05-18 ENCOUNTER — Ambulatory Visit (HOSPITAL_COMMUNITY): Admission: RE | Admit: 2019-05-18 | Payer: Medicare Other | Source: Home / Self Care | Admitting: Oral Surgery

## 2019-05-18 ENCOUNTER — Encounter (HOSPITAL_COMMUNITY): Admission: RE | Payer: Self-pay | Source: Home / Self Care

## 2019-05-18 SURGERY — MULTIPLE EXTRACTION WITH ALVEOLOPLASTY
Anesthesia: General

## 2019-07-02 ENCOUNTER — Inpatient Hospital Stay (HOSPITAL_COMMUNITY)
Admission: EM | Admit: 2019-07-02 | Discharge: 2019-07-04 | DRG: 683 | Disposition: A | Discharge status: Home / Self Care | Payer: Medicare Other | Attending: Internal Medicine | Admitting: Internal Medicine

## 2019-07-02 ENCOUNTER — Emergency Department (HOSPITAL_COMMUNITY): Payer: Medicare Other

## 2019-07-02 ENCOUNTER — Other Ambulatory Visit: Payer: Self-pay

## 2019-07-02 ENCOUNTER — Encounter (HOSPITAL_COMMUNITY): Payer: Self-pay | Admitting: Emergency Medicine

## 2019-07-02 DIAGNOSIS — Z20822 Contact with and (suspected) exposure to covid-19: Secondary | ICD-10-CM | POA: Diagnosis present

## 2019-07-02 DIAGNOSIS — E785 Hyperlipidemia, unspecified: Secondary | ICD-10-CM | POA: Diagnosis present

## 2019-07-02 DIAGNOSIS — F17210 Nicotine dependence, cigarettes, uncomplicated: Secondary | ICD-10-CM | POA: Diagnosis present

## 2019-07-02 DIAGNOSIS — F418 Other specified anxiety disorders: Secondary | ICD-10-CM | POA: Diagnosis present

## 2019-07-02 DIAGNOSIS — N179 Acute kidney failure, unspecified: Principal | ICD-10-CM | POA: Diagnosis present

## 2019-07-02 DIAGNOSIS — R0602 Shortness of breath: Secondary | ICD-10-CM | POA: Diagnosis not present

## 2019-07-02 DIAGNOSIS — Z888 Allergy status to other drugs, medicaments and biological substances status: Secondary | ICD-10-CM

## 2019-07-02 DIAGNOSIS — E872 Acidosis, unspecified: Secondary | ICD-10-CM

## 2019-07-02 DIAGNOSIS — E559 Vitamin D deficiency, unspecified: Secondary | ICD-10-CM | POA: Diagnosis present

## 2019-07-02 DIAGNOSIS — K219 Gastro-esophageal reflux disease without esophagitis: Secondary | ICD-10-CM | POA: Diagnosis present

## 2019-07-02 DIAGNOSIS — Z881 Allergy status to other antibiotic agents status: Secondary | ICD-10-CM

## 2019-07-02 DIAGNOSIS — F319 Bipolar disorder, unspecified: Secondary | ICD-10-CM | POA: Diagnosis present

## 2019-07-02 DIAGNOSIS — R6510 Systemic inflammatory response syndrome (SIRS) of non-infectious origin without acute organ dysfunction: Secondary | ICD-10-CM | POA: Diagnosis present

## 2019-07-02 DIAGNOSIS — Z880 Allergy status to penicillin: Secondary | ICD-10-CM

## 2019-07-02 DIAGNOSIS — E7800 Pure hypercholesterolemia, unspecified: Secondary | ICD-10-CM | POA: Diagnosis present

## 2019-07-02 LAB — COMPREHENSIVE METABOLIC PANEL
ALT: 16 U/L (ref 0–44)
AST: 19 U/L (ref 15–41)
Albumin: 3.9 g/dL (ref 3.5–5.0)
Alkaline Phosphatase: 113 U/L (ref 38–126)
Anion gap: 15 (ref 5–15)
BUN: 15 mg/dL (ref 6–20)
CO2: 15 mmol/L — ABNORMAL LOW (ref 22–32)
Calcium: 9.5 mg/dL (ref 8.9–10.3)
Chloride: 108 mmol/L (ref 98–111)
Creatinine, Ser: 1.89 mg/dL — ABNORMAL HIGH (ref 0.44–1.00)
GFR calc Af Amer: 37 mL/min — ABNORMAL LOW (ref >60)
GFR calc non Af Amer: 32 mL/min — ABNORMAL LOW (ref >60)
Glucose, Bld: 89 mg/dL (ref 70–99)
Potassium: 3.1 mmol/L — ABNORMAL LOW (ref 3.5–5.1)
Sodium: 138 mmol/L (ref 135–145)
Total Bilirubin: 1 mg/dL (ref 0.3–1.2)
Total Protein: 6.9 g/dL (ref 6.5–8.1)

## 2019-07-02 LAB — CBC WITH DIFFERENTIAL/PLATELET
Abs Immature Granulocytes: 0.03 10*3/uL (ref 0.00–0.07)
Basophils Absolute: 0 10*3/uL (ref 0.0–0.1)
Basophils Relative: 0 %
Eosinophils Absolute: 0.2 10*3/uL (ref 0.0–0.5)
Eosinophils Relative: 2 %
HCT: 40.8 % (ref 36.0–46.0)
Hemoglobin: 13.8 g/dL (ref 12.0–15.0)
Immature Granulocytes: 0 %
Lymphocytes Relative: 22 %
Lymphs Abs: 2.5 10*3/uL (ref 0.7–4.0)
MCH: 29.5 pg (ref 26.0–34.0)
MCHC: 33.8 g/dL (ref 30.0–36.0)
MCV: 87.2 fL (ref 80.0–100.0)
Monocytes Absolute: 0.7 10*3/uL (ref 0.1–1.0)
Monocytes Relative: 6 %
Neutro Abs: 7.7 10*3/uL (ref 1.7–7.7)
Neutrophils Relative %: 70 %
Platelets: 328 10*3/uL (ref 150–400)
RBC: 4.68 MIL/uL (ref 3.87–5.11)
RDW: 13.1 % (ref 11.5–15.5)
WBC: 11.2 10*3/uL — ABNORMAL HIGH (ref 4.0–10.5)
nRBC: 0 % (ref 0.0–0.2)

## 2019-07-02 LAB — POCT I-STAT EG7
Acid-base deficit: 11 mmol/L — ABNORMAL HIGH (ref 0.0–2.0)
Bicarbonate: 13.5 mmol/L — ABNORMAL LOW (ref 20.0–28.0)
Calcium, Ion: 1.13 mmol/L — ABNORMAL LOW (ref 1.15–1.40)
HCT: 40 % (ref 36.0–46.0)
Hemoglobin: 13.6 g/dL (ref 12.0–15.0)
O2 Saturation: 92 %
Potassium: 3 mmol/L — ABNORMAL LOW (ref 3.5–5.1)
Sodium: 140 mmol/L (ref 135–145)
TCO2: 14 mmol/L — ABNORMAL LOW (ref 22–32)
pCO2, Ven: 26.5 mmHg — ABNORMAL LOW (ref 44.0–60.0)
pH, Ven: 7.317 (ref 7.250–7.430)
pO2, Ven: 68 mmHg — ABNORMAL HIGH (ref 32.0–45.0)

## 2019-07-02 LAB — RESPIRATORY PANEL BY RT PCR (FLU A&B, COVID)
Influenza A by PCR: NEGATIVE
Influenza B by PCR: NEGATIVE
SARS Coronavirus 2 by RT PCR: NEGATIVE

## 2019-07-02 LAB — TROPONIN I (HIGH SENSITIVITY)
Troponin I (High Sensitivity): 5 ng/L (ref <18)
Troponin I (High Sensitivity): 6 ng/L (ref <18)

## 2019-07-02 LAB — TSH: TSH: 2.926 u[IU]/mL (ref 0.350–4.500)

## 2019-07-02 LAB — D-DIMER, QUANTITATIVE: D-Dimer, Quant: 0.65 ug/mL-FEU — ABNORMAL HIGH (ref 0.00–0.50)

## 2019-07-02 LAB — LACTIC ACID, PLASMA: Lactic Acid, Venous: 4.7 mmol/L (ref 0.5–1.9)

## 2019-07-02 MED ORDER — LORAZEPAM 2 MG/ML IJ SOLN
1.0000 mg | Freq: Once | INTRAMUSCULAR | Status: AC
  Administered 2019-07-02: 1 mg via INTRAVENOUS
  Filled 2019-07-02: qty 1

## 2019-07-02 MED ORDER — SODIUM CHLORIDE 0.9 % IV BOLUS
1000.0000 mL | Freq: Once | INTRAVENOUS | Status: AC
  Administered 2019-07-02: 1000 mL via INTRAVENOUS

## 2019-07-02 MED ORDER — POTASSIUM CHLORIDE IN NACL 20-0.9 MEQ/L-% IV SOLN
INTRAVENOUS | Status: AC
  Filled 2019-07-02: qty 1000

## 2019-07-02 MED ORDER — LORAZEPAM 2 MG/ML IJ SOLN
1.0000 mg | INTRAMUSCULAR | Status: DC | PRN
  Administered 2019-07-03 (×2): 1 mg via INTRAVENOUS
  Filled 2019-07-02 (×2): qty 1

## 2019-07-02 MED ORDER — MAGNESIUM SULFATE IN D5W 1-5 GM/100ML-% IV SOLN
1.0000 g | Freq: Once | INTRAVENOUS | Status: AC
  Administered 2019-07-03: 1 g via INTRAVENOUS
  Filled 2019-07-02: qty 100

## 2019-07-02 MED ORDER — POTASSIUM CHLORIDE CRYS ER 20 MEQ PO TBCR
20.0000 meq | EXTENDED_RELEASE_TABLET | Freq: Once | ORAL | Status: AC
  Administered 2019-07-03: 20 meq via ORAL
  Filled 2019-07-02: qty 1

## 2019-07-02 MED ORDER — IOHEXOL 350 MG/ML SOLN
75.0000 mL | Freq: Once | INTRAVENOUS | Status: AC | PRN
  Administered 2019-07-02: 75 mL via INTRAVENOUS

## 2019-07-02 NOTE — H&P (Signed)
History and Physical    DEICI PATIENT [id number] DOB: [birth year] DOA: 07/02/2019  PCP: Berkley Harvey, NP   Patient coming from: Home   Chief Complaint: SOB, malaise  HPI: Kimberly Browning is a 44 y.o. female with medical history significant for depression, anxiety, insomnia, and vertigo, now presenting to the emergency department for evaluation of shortness of breath and malaise.  Patient reports recent abdominal discomfort, nausea, and lightheadedness few days after she had stopped taking duloxetine, called the prescribing physician on 07/02/2019 who raised concern for withdrawal and recommended that she take a dose which she did.  Since then, she has had improvement in her abdominal discomfort, improvement in nausea, but had some loose stool today and progressive shortness of breath and general malaise.  She denies any significant cough and denies any fevers or chills.  She denies any chest pain, palpitations, or leg tenderness.  She has a mild headache that she attributes to her impending menstrual period.  She denies any change in vision or hearing or focal numbness or weakness.  Denies any recent fall or trauma.  She denies any alcohol use and denies any recent illicit substance use.  She denies any ingestion or medication misuse.  ED Course: Upon arrival to the ED, patient is found to be afebrile, saturating well on room air, mildly tachypneic, tachycardic up in the 140s, and with stable blood pressure.  EKG features sinus tachycardia with ST-T abnormalities.  CTA chest is negative for PE but notable for atelectasis at the lingula and right lower lobe.  Chemistry panel features a potassium of 3.1, bicarbonate 15, and creatinine 1.89, up from prior of 0.9.  CBC features mild leukocytosis.  Lactic acid is elevated 4.7.  COVID-19 PCR is negative.  TSH is normal.  Patient was given 2 L of normal saline and multiple doses of IV Ativan in the ED and hospitalists were consulted for admission.  Review  of Systems:  All other systems reviewed and apart from HPI, are negative.  Past Medical History:  Diagnosis Date  . Bipolar disorder ([place])   . Depression   . Dry skin    hands  . Feeling of incomplete bladder emptying   . GERD (gastroesophageal reflux disease)   . Hypercholesteremia   . Nocturia   . Urge urinary incontinence   . Vitamin D deficiency   . Wears contact lenses     Past Surgical History:  Procedure Laterality Date  . BREAST REDUCTION SURGERY  05/10/2011   Procedure: MAMMARY REDUCTION BILATERAL (BREAST);  Surgeon: Hermelinda Dellen;  Location: [facility];  Service: Plastics;  Laterality: Bilateral;  bilateral breast reduction  . DILATION AND EVACUATION  12/26/2008  . INTERSTIM IMPLANT PLACEMENT N/A 07/09/2014   Procedure: Barrie Lyme IMPLANT FIRST STAGE;  Surgeon: Reece Packer, MD;  Location: [facility];  Service: Urology;  Laterality: N/A;  . INTERSTIM IMPLANT PLACEMENT N/A 07/09/2014   Procedure: Barrie Lyme IMPLANT SECOND STAGE;  Surgeon: Reece Packer, MD;  Location: [facility];  Service: Urology;  Laterality: N/A;  . INTRAUTERINE DEVICE (IUD) INSERTION  sept 2011   mirena  . WISDOM TOOTH EXTRACTION  as a teenager     reports that she has been smoking cigarettes. She has a 4.75 pack-year smoking history. She has never used smokeless tobacco. She reports that she does not drink alcohol or use drugs.  Allergies  Allergen Reactions  . Geodon [Ziprasidone Hydrochloride] Shortness Of Breath and Swelling  .  Hibiclens [Chlorhexidine] Itching    Redness and itching after using CHG wipes  . Sulfamethoxazole-Trimethoprim Other (See Comments)    hallucinations  . Doxycycline Other (See Comments)    Unknown reaction  . Gabapentin Other (See Comments)    Unknown reaction  . Other Other (See Comments)    Bath and body works body wash and lotion caused itching, reddness and rash  . Penicillins Other (See Comments)     Unknown reaction Did it involve swelling of the face/tongue/throat, SOB, or low BP? Unknown Did it involve sudden or severe rash/hives, skin peeling, or any reaction on the inside of your mouth or nose? Unknown Did you need to seek medical attention at a hospital or doctor's office? Unknown When did it last happen?unknown If all above answers are "NO", may proceed with cephalosporin use.  . Promethazine Other (See Comments)  . Depakote [Divalproex Sodium] Rash    Family History  Adopted: Yes     Prior to Admission medications   Medication Sig Start Date End Date Taking? Authorizing Provider  DULoxetine (CYMBALTA) 60 MG capsule Take 60 mg by mouth daily. 06/19/19  Yes [provider]  phentermine 37.5 MG capsule Take 37.5 mg by mouth daily. For diet control 06/05/19  Yes [provider]  RABEprazole (ACIPHEX) 20 MG tablet Take 20 mg by mouth daily. 05/13/19  Yes [provider]  temazepam (RESTORIL) 15 MG capsule Take 45 mg by mouth at bedtime. 06/12/19  Yes [provider]    Physical Exam: Vitals:   07/02/19 2200 07/02/19 2225 07/02/19 2300 07/02/19 2330  BP: 137/84  130/83 (!) 143/80  Pulse: (!) 148  87 94  Resp: (!) 21   (!) 28  Temp:  98.4 F (36.9 C)    TempSrc:  Oral    SpO2: 100%  100% 100%    Constitutional: NAD, anxious   Eyes: PERTLA, lids and conjunctivae normal ENMT: Mucous membranes are moist. Posterior pharynx clear of any exudate or lesions.   Neck: normal, supple, no masses, no thyromegaly Respiratory: clear to auscultation bilaterally, no wheezing, no crackles. No accessory muscle use.  Cardiovascular: Rate ~120 and regular. No extremity edema.   Abdomen: No distension, no tenderness, soft. Bowel sounds active.  Musculoskeletal: no clubbing / cyanosis. No joint deformity upper and lower extremities.   Skin: no significant rashes, lesions, ulcers. Warm, dry, well-perfused. Neurologic: CN 2-12 grossly intact. Sensation  intact, patellar DTR 2+ b/l. Strength 5/5 in all 4 limbs. Resting tremor.   Psychiatric: Alert and oriented to person, place, month, and situation. Anxious. Cooperative.    Labs and Imaging on Admission: I have personally reviewed following labs and imaging studies  CBC: Recent Labs  Lab 07/02/19 1804 07/02/19 2302  WBC 11.2*  --   NEUTROABS 7.7  --   HGB 13.8 13.6  HCT 40.8 40.0  MCV 87.2  --   PLT 328  --    Basic Metabolic Panel: Recent Labs  Lab 07/02/19 1804 07/02/19 2302  NA 138 140  K 3.1* 3.0*  CL 108  --   CO2 15*  --   GLUCOSE 89  --   BUN 15  --   CREATININE 1.89*  --   CALCIUM 9.5  --    GFR: CrCl cannot be calculated (Unknown ideal weight.). Liver Function Tests: Recent Labs  Lab 07/02/19 1804  AST 19  ALT 16  ALKPHOS 113  BILITOT 1.0  PROT 6.9  ALBUMIN 3.9   No results for  input(s): LIPASE, AMYLASE in the last 168 hours. No results for input(s): AMMONIA in the last 168 hours. Coagulation Profile: No results for input(s): INR, PROTIME in the last 168 hours. Cardiac Enzymes: No results for input(s): CKTOTAL, CKMB, CKMBINDEX, TROPONINI in the last 168 hours. BNP (last 3 results) No results for input(s): PROBNP in the last 8760 hours. HbA1C: No results for input(s): HGBA1C in the last 72 hours. CBG: No results for input(s): GLUCAP in the last 168 hours. Lipid Profile: No results for input(s): CHOL, HDL, LDLCALC, TRIG, CHOLHDL, LDLDIRECT in the last 72 hours. Thyroid Function Tests: Recent Labs    07/02/19 2202  TSH 2.926   Anemia Panel: No results for input(s): VITAMINB12, FOLATE, FERRITIN, TIBC, IRON, RETICCTPCT in the last 72 hours. Urine analysis:    Component Value Date/Time   COLORURINE yellow 04/24/2009 1100   APPEARANCEUR Clear 04/24/2009 1100   LABSPEC 1.010 04/24/2009 1100   PHURINE 6.0 04/24/2009 1100   HGBUR negative 04/24/2009 1100   BILIRUBINUR negative 04/24/2009 1100   UROBILINOGEN 0.2 04/24/2009 1100   NITRITE  negative 04/24/2009 1100   Sepsis Labs: @LABRCNTIP (procalcitonin:4,lacticidven:4) ) Recent Results (from the past 240 hour(s))  Respiratory Panel by RT PCR (Flu A&B, Covid) - Nasopharyngeal Swab     Status: None   Collection Time: 07/02/19  6:04 PM   Specimen: Nasopharyngeal Swab  Result Value Ref Range Status   SARS Coronavirus 2 by RT PCR NEGATIVE NEGATIVE Final    Comment: (NOTE) SARS-CoV-2 target nucleic acids are NOT DETECTED. The SARS-CoV-2 RNA is generally detectable in upper respiratoy specimens during the acute phase of infection. The lowest concentration of SARS-CoV-2 viral copies this assay can detect is 131 copies/mL. A negative result does not preclude SARS-Cov-2 infection and should not be used as the sole basis for treatment or other patient management decisions. A negative result may occur with  improper specimen collection/handling, submission of specimen other than nasopharyngeal swab, presence of viral mutation(s) within the areas targeted by this assay, and inadequate number of viral copies (<131 copies/mL). A negative result must be combined with clinical observations, patient history, and epidemiological information. The expected result is Negative. Fact Sheet for Patients:  PinkCheek.be Fact Sheet for Healthcare Providers:  GravelBags.it This test is not yet ap proved or cleared by the [place] FDA and  has been authorized for detection and/or diagnosis of SARS-CoV-2 by FDA under an Emergency Use Authorization (EUA). This EUA will remain  in effect (meaning this test can be used) for the duration of the COVID-19 declaration under Section 564(b)(1) of the Act, 21 U.S.C. section 360bbb-3(b)(1), unless the authorization is terminated or revoked sooner.    Influenza A by PCR NEGATIVE NEGATIVE Final   Influenza B by PCR NEGATIVE NEGATIVE Final    Comment: (NOTE) The Xpert Xpress SARS-CoV-2/FLU/RSV  assay is intended as an aid in  the diagnosis of influenza from Nasopharyngeal swab specimens and  should not be used as a sole basis for treatment. Nasal washings and  aspirates are unacceptable for Xpert Xpress SARS-CoV-2/FLU/RSV  testing. Fact Sheet for Patients: PinkCheek.be Fact Sheet for Healthcare Providers: GravelBags.it This test is not yet approved or cleared by the [place] FDA and  has been authorized for detection and/or diagnosis of SARS-CoV-2 by  FDA under an Emergency Use Authorization (EUA). This EUA will remain  in effect (meaning this test can be used) for the duration of the  Covid-19 declaration under Section 564(b)(1) of the Act, 21  U.S.C. section 360bbb-3(b)(1),  unless the authorization is  terminated or revoked. Performed at [facility], [place] [address] [place], [place] [address]      Radiological Exams on Admission: CT Angio Chest PE W and/or Wo Contrast  Result Date: 07/02/2019 CLINICAL DATA:  Shortness of breath EXAM: CT ANGIOGRAPHY CHEST WITH CONTRAST TECHNIQUE: Multidetector CT imaging of the chest was performed using the standard protocol during bolus administration of intravenous contrast. Multiplanar CT image reconstructions and MIPs were obtained to evaluate the vascular anatomy. CONTRAST:  62mL OMNIPAQUE IOHEXOL 350 MG/ML SOLN COMPARISON:  None. FINDINGS: Cardiovascular: Heart is normal size. Aorta is normal caliber. No filling defects in the pulmonary arteries to suggestpulmonary emboli. Mediastinum/Nodes: No mediastinal, hilar, or axillary adenopathy. Trachea and esophagus are unremarkable. Thyroid unremarkable. Lungs/Pleura: Areas of linear scarring or atelectasis in the lingula and right lower lobe. No effusions. Upper Abdomen: Imaging into the upper abdomen shows no acute findings. Musculoskeletal: Chest wall soft tissues are unremarkable. No acute bony abnormality. Review of the MIP  images confirms the above findings. IMPRESSION: No evidence of pulmonary embolus. Areas of atelectasis in the lingula and right lower lobe. Electronically Signed   By: Rolm Baptise M.D.   On: 07/02/2019 21:26   DG Chest Port 1 View  Result Date: 07/02/2019 CLINICAL DATA:  Shortness of breath. EXAM: PORTABLE CHEST 1 VIEW COMPARISON:  None. FINDINGS: A trace amount of linear atelectasis is seen within the right lung base. There is no evidence of a pleural effusion or pneumothorax. The heart size and mediastinal contours are within normal limits. The visualized skeletal structures are unremarkable. IMPRESSION: Trace amount of right basilar linear atelectasis. Electronically Signed   By: Virgina Norfolk M.D.   On: 07/02/2019 18:29    EKG: Independently reviewed. Sinus tachycardia, rate 101, non-specific ST-T abnormalities.   Assessment/Plan   1. SIRS  - Presents with SOB and malaise, found to be tachycardic and tachypneic with leukocytosis and lactic acidosis, but no fever, no PNA on CTA chest, benign abdominal exam, denies urinary sxs, and no meningismus  - There was concern for possible serotonin syndrome in light of duloxetine use (and phentermine which can increase serotonin levels), and she was given multiple doses Ativan in ED  - There is no fever, clonus, or hyperreflexia on admission  - Culture blood and urine, hold duloxetine and phentermine, continue IVF hydration, continue Ativan as needed, follow cultures and clinical course   2. Acute kidney injury  - SCr is 1.89 in ED, up from 0.9 remotely  - Likely acute prerenal azotemia in setting of recent N/V/D  - She was given 2 liters IVF in ED  - Renally-dose medications, check FENa, continue IVF hydration, repeat chem panel in am    3. Depression, anxiety  - Hold duloxetine as above, hold temazepam while using Ativan    DVT prophylaxis: sq heparin  Code Status: Full  Family Communication: Discussed with patient  Consults called:  None  Admission status: Observation     Vianne Bulls, MD [facility] Pager: See [email or web address]  If 7AM-7PM, please contact the daytime attending [email or web address]  07/03/2019, 12:05 AM

## 2019-07-02 NOTE — ED Triage Notes (Signed)
Pt c/o shortness of breath and chills. States she stopped taking her meds for depression last week because she didn't like the way it made her feel. Reports that she was having abdominal pain and nausea, so her doc had her start back on her regular dose and wean off. States shortness of breath started today.

## 2019-07-02 NOTE — ED Provider Notes (Signed)
Kimberly Browning   CSN: [id number] Arrival date & time: 07/02/19  1658     History Chief Complaint  Patient presents with  . Shortness of Breath    Kimberly Browning is a 44 y.o. female.  Patient is a 44 year old female with a history of fetal alcohol syndrome, depression, anxiety, GERD, daily tobacco abuse who is presenting today with multiple complaints.  Patient states that she follows with a psychiatrist and recently her doxepin stopped working so she was started on duloxetine.  She states she is taken it almost 2 weeks but it was making her feel like a zombie.  She was tired and not herself so she stopped taking it cold [place] 3 days ago.  She states then she started feeling lightheaded, chills, anxious, cough, nausea.  She has not had any vomiting or diarrhea but states she cannot taste anything but her smell is normal.  She has not had fever.  Today she noticed she was feeling short of breath and she was breathing very fast.  At one point her whole body felt like it was tingling.  She spoke with her psychologist who recommended she take 1 dose of the duloxetine because she may be withdrawing.  She states she does not feel any better.  No history of cardiac etiology and no history of PEs.  She is adopted so she does not know her family history.  There is been no other recent medication changes.  She denies SI or HI  The history is provided by the patient.  Shortness of Breath Severity:  Moderate Onset quality:  Sudden Duration:  1 day Timing:  Sporadic Progression:  Unchanged Chronicity:  New Context comment:  Constant Associated symptoms: chest pain and cough   Associated symptoms: no abdominal pain, no sputum production and no vomiting   Associated symptoms comment:  Dizziness, nausea, insomnia      Past Medical History:  Diagnosis Date  . Bipolar disorder ([place])   . Depression   . Dry skin    hands  . Feeling of incomplete  bladder emptying   . GERD (gastroesophageal reflux disease)   . Hypercholesteremia   . Nocturia   . Urge urinary incontinence   . Vitamin D deficiency   . Wears contact lenses     Patient Active Problem List   Diagnosis Date Noted  . TOBACCO USER 06/09/2009  . FETAL ALCOHOL SYNDROME 01/23/2009  . MEMORY LOSS 01/23/2009  . MOLAR PREGNANCY 12/12/2008  . INSOMNIA 12/11/2008  . 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 BACK PAIN, LUMBAR 10/10/2007  . OBESITY NOS 02/22/2007  . HYPERLIPIDEMIA 01/03/2007  . 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 BIPOLAR DISORDER 08/04/2006  . SEBORRHEIC DERMATITIS, NOS 08/04/2006    Past Surgical History:  Procedure Laterality Date  . BREAST REDUCTION SURGERY  05/10/2011   Procedure: MAMMARY REDUCTION BILATERAL (BREAST);  Surgeon: Hermelinda Dellen;  Location: [facility];  Service: Plastics;  Laterality: Bilateral;  bilateral breast reduction  . DILATION AND EVACUATION  12/26/2008  . INTERSTIM IMPLANT PLACEMENT N/A 07/09/2014   Procedure: Barrie Lyme IMPLANT FIRST STAGE;  Surgeon: Reece Packer, MD;  Location[facility];  Service: Urology;  Laterality: N/A;  . INTERSTIM IMPLANT PLACEMENT N/A 07/09/2014   Procedure: Barrie Lyme IMPLANT SECOND STAGE;  Surgeon: Reece Packer, MD;  Location: [facility];  Service: Urology;  Laterality: N/A;  . INTRAUTERINE DEVICE (IUD) INSERTION  sept 2011   mirena  .  WISDOM TOOTH EXTRACTION  as a teenager     OB History   No obstetric history on file.     Family History  Adopted: Yes    Social History   Tobacco Use  . Smoking status: Current Every Day Smoker    Packs/day: 0.25    Years: 19.00    Pack years: 4.75    Types: Cigarettes  . Smokeless tobacco: Never Used  . Tobacco comment:  1 pp3d  Substance Use Topics  . Alcohol use: No  . Drug use: No    Home Medications Prior to Admission medications   Medication Sig Start Date End Date Taking? Authorizing  Provider  desvenlafaxine (PRISTIQ) 50 MG 24 hr tablet Take 50 mg by mouth daily.    [provider]  doxepin (SINEQUAN) 150 MG capsule Take 300 mg by mouth at bedtime.     [provider]  Liraglutide -Weight Management (SAXENDA) 18 MG/3ML SOPN Inject into the skin.    [provider]  ranitidine (ZANTAC) 300 MG tablet Take 300 mg by mouth at bedtime.    [provider]    Allergies    Geodon [ziprasidone hydrochloride], Hibiclens [chlorhexidine], Doxycycline, Gabapentin, Other, Penicillins, Promethazine, and Depakote [divalproex sodium]  Review of Systems   Review of Systems  Respiratory: Positive for cough and shortness of breath. Negative for sputum production.   Cardiovascular: Positive for chest pain.  Gastrointestinal: Negative for abdominal pain and vomiting.  All other systems reviewed and are negative.   Physical Exam Updated Vital Signs BP 116/85 (BP Location: Left Arm)   Pulse 90   Temp 98.8 F (37.1 C) (Oral)   Resp (!) 31   SpO2 100%   Physical Exam Vitals and nursing Browning reviewed.  Constitutional:      General: She is not in acute distress.    Appearance: She is well-developed and normal weight.  HENT:     Head: Normocephalic and atraumatic.  Eyes:     Conjunctiva/sclera: Conjunctivae normal.     Pupils: Pupils are equal, round, and reactive to light.  Cardiovascular:     Rate and Rhythm: Regular rhythm. Tachycardia present.     Heart sounds: No murmur.  Pulmonary:     Effort: Pulmonary effort is normal. No respiratory distress.     Breath sounds: Normal breath sounds. No wheezing or rales.  Abdominal:     General: There is no distension.     Palpations: Abdomen is soft.     Tenderness: There is no abdominal tenderness. There is no guarding or rebound.  Musculoskeletal:        General: No tenderness. Normal range of motion.     Cervical back: Normal range of motion and neck supple.  Skin:    General: Skin is warm  and dry.     Capillary Refill: Capillary refill takes 2 to 3 seconds.     Findings: No erythema or rash.  Neurological:     Mental Status: She is alert and oriented to person, place, and time.     Cranial Nerves: Cranial nerves are intact.     Motor: Motor function is intact.     Coordination: Coordination is intact.  Psychiatric:        Mood and Affect: Mood is anxious and elated.        Behavior: Behavior is hyperactive.        Thought Content: Thought content does not include homicidal or suicidal ideation.     ED Results /  Procedures / Treatments   Labs (all labs ordered are listed, but only abnormal results are displayed) Labs Reviewed  CBC WITH DIFFERENTIAL/PLATELET - Abnormal; Notable for the following components:      Result Value   WBC 11.2 (*)    All other components within normal limits  COMPREHENSIVE METABOLIC PANEL - Abnormal; Notable for the following components:   Potassium 3.1 (*)    CO2 15 (*)    Creatinine, Ser 1.89 (*)    GFR calc non Af Amer 32 (*)    GFR calc Af Amer 37 (*)    All other components within normal limits  D-DIMER, QUANTITATIVE (NOT AT [facility]) - Abnormal; Notable for the following components:   D-Dimer, Quant 0.65 (*)    All other components within normal limits  LACTIC ACID, PLASMA - Abnormal; Notable for the following components:   Lactic Acid, Venous 4.7 (*)    All other components within normal limits  RESPIRATORY PANEL BY RT PCR (FLU A&B, COVID)  TROPONIN I (HIGH SENSITIVITY)  TROPONIN I (HIGH SENSITIVITY)    EKG EKG Interpretation  Date/Time:  Monday July 02 2019 17:04:44 EST Ventricular Rate:  101 PR Interval:  136 QRS Duration: 70 QT Interval:  332 QTC Calculation: 430 R Axis:   71 Text Interpretation: Sinus tachycardia Right atrial enlargement ST & T wave abnormality, consider inferior ischemia ST & T wave abnormality, consider anterolateral ischemia No previous tracing Confirmed by Blanchie Dessert [phone]) on 07/02/2019  5:13:11 PM   Radiology CT Angio Chest PE W and/or Wo Contrast  Result Date: 07/02/2019 CLINICAL DATA:  Shortness of breath EXAM: CT ANGIOGRAPHY CHEST WITH CONTRAST TECHNIQUE: Multidetector CT imaging of the chest was performed using the standard protocol during bolus administration of intravenous contrast. Multiplanar CT image reconstructions and MIPs were obtained to evaluate the vascular anatomy. CONTRAST:  68mL OMNIPAQUE IOHEXOL 350 MG/ML SOLN COMPARISON:  None. FINDINGS: Cardiovascular: Heart is normal size. Aorta is normal caliber. No filling defects in the pulmonary arteries to suggestpulmonary emboli. Mediastinum/Nodes: No mediastinal, hilar, or axillary adenopathy. Trachea and esophagus are unremarkable. Thyroid unremarkable. Lungs/Pleura: Areas of linear scarring or atelectasis in the lingula and right lower lobe. No effusions. Upper Abdomen: Imaging into the upper abdomen shows no acute findings. Musculoskeletal: Chest wall soft tissues are unremarkable. No acute bony abnormality. Review of the MIP images confirms the above findings. IMPRESSION: No evidence of pulmonary embolus. Areas of atelectasis in the lingula and right lower lobe. Electronically Signed   By: Rolm Baptise M.D.   On: 07/02/2019 21:26   DG Chest Port 1 View  Result Date: 07/02/2019 CLINICAL DATA:  Shortness of breath. EXAM: PORTABLE CHEST 1 VIEW COMPARISON:  None. FINDINGS: A trace amount of linear atelectasis is seen within the right lung base. There is no evidence of a pleural effusion or pneumothorax. The heart size and mediastinal contours are within normal limits. The visualized skeletal structures are unremarkable. IMPRESSION: Trace amount of right basilar linear atelectasis. Electronically Signed   By: Virgina Norfolk M.D.   On: 07/02/2019 18:29    Procedures Procedures (including critical care time)  Medications Ordered in ED Medications  LORazepam (ATIVAN) injection 1 mg (1 mg Intravenous Given 07/02/19 1803)      ED Course  I have reviewed the triage vital signs and the nursing notes.  Pertinent labs & imaging results that were available during my care of the patient were reviewed by me and considered in my medical decision making (see chart for details).  MDM Rules/Calculators/A&P                      44 year old female with a history of tobacco abuse and recent medication changes presenting with multiple vague symptoms.  Patient denies fever but is complaining of shortness of breath.  Patient does appear anxious on exam were slightly manic.  She is talking rapidly and breathing quickly but oxygen saturation is 100%.  Her lungs are clear bilaterally and she is intermittently tachycardic to the low 100s.  Patient has no known heart history and EKG nonspecific ST abnormalities without old to compare.   Patient does appear anxious and may have some hyperventilating that is causing her shortness of breath.  We will also check to ensure patient does not have Covid.  She is afebrile here vital signs are normal except for elevated respiratory rate and intermittent tachycardia.  Cannot be PERC'd but low risk Wells criteria.  Labs are pending  9:59 PM Patient's labs show a CBC with minimal leukocytosis of 11,000, negative troponin of 5 and 6, elevated D-dimer at 0.65 and CMP with new AKI with creatinine of 1.89, mild hypokalemia of 3.1 and CO2 of 15.  Patient has an anion gap of 15.  Lactic acid is 4.8.  Patient does take phentermine but takes no other medications that would cause renal injury.  She denies any alcohol use.  Patient given 2 L of fluid.  On repeat evaluation patient is still tachypneic.  Covid is negative.  Will send to ED 7 to ensure normal pH.  Patient also is now tachycardic in the 120s.  TSH was also added.  Patient has received 2 mg of Ativan but still is dyspneic.  But otherwise is able to answer all questions without distress.  10:47 PM Pt is hyperreflexic, still intermittent tachy up to  the 150's and dyspneic. With recent start of Cymbalta, changing doses, hyperreflexia, intermittent tachycardia, elevated lactic acid with no infectious source concern for potential serotonin syndrome. Will give patient 3rd dose of Ativan. VBG and TSH pending. Feel pt will need admission for further care.  CRITICAL CARE Performed by: Maryan Sivak Total critical care time: 30 minutes Critical care time was exclusive of separately billable procedures and treating other patients. Critical care was necessary to treat or prevent imminent or life-threatening deterioration. Critical care was time spent personally by me on the following activities: development of treatment plan with patient and/or surrogate as well as nursing, discussions with consultants, evaluation of patient's response to treatment, examination of patient, obtaining history from patient or surrogate, ordering and performing treatments and interventions, ordering and review of laboratory studies, ordering and review of radiographic studies, pulse oximetry and re-evaluation of patient's condition.   Final Clinical Impression(s) / ED Diagnoses Final diagnoses:  Shortness of breath  Lactic acidosis  AKI (acute kidney injury) [facility])    Rx / DC Orders ED Discharge Orders    None       Blanchie Dessert, MD 07/02/19 2323

## 2019-07-03 DIAGNOSIS — R6510 Systemic inflammatory response syndrome (SIRS) of non-infectious origin without acute organ dysfunction: Secondary | ICD-10-CM | POA: Diagnosis present

## 2019-07-03 DIAGNOSIS — E785 Hyperlipidemia, unspecified: Secondary | ICD-10-CM | POA: Diagnosis present

## 2019-07-03 DIAGNOSIS — N179 Acute kidney failure, unspecified: Secondary | ICD-10-CM | POA: Diagnosis present

## 2019-07-03 DIAGNOSIS — Z888 Allergy status to other drugs, medicaments and biological substances status: Secondary | ICD-10-CM | POA: Diagnosis not present

## 2019-07-03 DIAGNOSIS — E7800 Pure hypercholesterolemia, unspecified: Secondary | ICD-10-CM | POA: Diagnosis present

## 2019-07-03 DIAGNOSIS — F319 Bipolar disorder, unspecified: Secondary | ICD-10-CM | POA: Diagnosis present

## 2019-07-03 DIAGNOSIS — F17210 Nicotine dependence, cigarettes, uncomplicated: Secondary | ICD-10-CM | POA: Diagnosis present

## 2019-07-03 DIAGNOSIS — E872 Acidosis: Secondary | ICD-10-CM | POA: Diagnosis present

## 2019-07-03 DIAGNOSIS — E559 Vitamin D deficiency, unspecified: Secondary | ICD-10-CM | POA: Diagnosis present

## 2019-07-03 DIAGNOSIS — R0602 Shortness of breath: Secondary | ICD-10-CM | POA: Diagnosis present

## 2019-07-03 DIAGNOSIS — F418 Other specified anxiety disorders: Secondary | ICD-10-CM | POA: Diagnosis present

## 2019-07-03 DIAGNOSIS — Z20822 Contact with and (suspected) exposure to covid-19: Secondary | ICD-10-CM | POA: Diagnosis present

## 2019-07-03 DIAGNOSIS — Z881 Allergy status to other antibiotic agents status: Secondary | ICD-10-CM | POA: Diagnosis not present

## 2019-07-03 DIAGNOSIS — K219 Gastro-esophageal reflux disease without esophagitis: Secondary | ICD-10-CM | POA: Diagnosis present

## 2019-07-03 DIAGNOSIS — Z880 Allergy status to penicillin: Secondary | ICD-10-CM | POA: Diagnosis not present

## 2019-07-03 LAB — CBC WITH DIFFERENTIAL/PLATELET
Abs Immature Granulocytes: 0.04 10*3/uL (ref 0.00–0.07)
Basophils Absolute: 0.1 10*3/uL (ref 0.0–0.1)
Basophils Relative: 0 %
Eosinophils Absolute: 0.3 10*3/uL (ref 0.0–0.5)
Eosinophils Relative: 2 %
HCT: 35.7 % — ABNORMAL LOW (ref 36.0–46.0)
Hemoglobin: 11.7 g/dL — ABNORMAL LOW (ref 12.0–15.0)
Immature Granulocytes: 0 %
Lymphocytes Relative: 19 %
Lymphs Abs: 2.2 10*3/uL (ref 0.7–4.0)
MCH: 29.4 pg (ref 26.0–34.0)
MCHC: 32.8 g/dL (ref 30.0–36.0)
MCV: 89.7 fL (ref 80.0–100.0)
Monocytes Absolute: 0.8 10*3/uL (ref 0.1–1.0)
Monocytes Relative: 7 %
Neutro Abs: 8.4 10*3/uL — ABNORMAL HIGH (ref 1.7–7.7)
Neutrophils Relative %: 72 %
Platelets: 271 10*3/uL (ref 150–400)
RBC: 3.98 MIL/uL (ref 3.87–5.11)
RDW: 13.4 % (ref 11.5–15.5)
WBC: 11.6 10*3/uL — ABNORMAL HIGH (ref 4.0–10.5)
nRBC: 0 % (ref 0.0–0.2)

## 2019-07-03 LAB — BASIC METABOLIC PANEL
Anion gap: 12 (ref 5–15)
Anion gap: 5 (ref 5–15)
BUN: 17 mg/dL (ref 6–20)
BUN: 9 mg/dL (ref 6–20)
CO2: 11 mmol/L — ABNORMAL LOW (ref 22–32)
CO2: 17 mmol/L — ABNORMAL LOW (ref 22–32)
Calcium: 8.1 mg/dL — ABNORMAL LOW (ref 8.9–10.3)
Calcium: 8.2 mg/dL — ABNORMAL LOW (ref 8.9–10.3)
Chloride: 113 mmol/L — ABNORMAL HIGH (ref 98–111)
Chloride: 117 mmol/L — ABNORMAL HIGH (ref 98–111)
Creatinine, Ser: 1.43 mg/dL — ABNORMAL HIGH (ref 0.44–1.00)
Creatinine, Ser: 1.08 mg/dL — ABNORMAL HIGH (ref 0.44–1.00)
GFR calc Af Amer: 52 mL/min — ABNORMAL LOW (ref >60)
GFR calc Af Amer: >60 mL/min (ref >60)
GFR calc non Af Amer: 45 mL/min — ABNORMAL LOW (ref >60)
GFR calc non Af Amer: >60 mL/min (ref >60)
Glucose, Bld: 124 mg/dL — ABNORMAL HIGH (ref 70–99)
Glucose, Bld: 101 mg/dL — ABNORMAL HIGH (ref 70–99)
Potassium: 3.2 mmol/L — ABNORMAL LOW (ref 3.5–5.1)
Potassium: 4 mmol/L (ref 3.5–5.1)
Sodium: 136 mmol/L (ref 135–145)
Sodium: 139 mmol/L (ref 135–145)

## 2019-07-03 LAB — URINALYSIS, ROUTINE W REFLEX MICROSCOPIC
Bilirubin Urine: NEGATIVE
Glucose, UA: NEGATIVE mg/dL
Hgb urine dipstick: NEGATIVE
Ketones, ur: NEGATIVE mg/dL
Leukocytes,Ua: NEGATIVE
Nitrite: NEGATIVE
Protein, ur: NEGATIVE mg/dL
Specific Gravity, Urine: 1.015 (ref 1.005–1.030)
pH: 5 (ref 5.0–8.0)

## 2019-07-03 LAB — LACTIC ACID, PLASMA
Lactic Acid, Venous: 2.1 mmol/L (ref 0.5–1.9)
Lactic Acid, Venous: 1 mmol/L (ref 0.5–1.9)

## 2019-07-03 LAB — CK: Total CK: 110 U/L (ref 38–234)

## 2019-07-03 LAB — MAGNESIUM
Magnesium: 1.4 mg/dL — ABNORMAL LOW (ref 1.7–2.4)
Magnesium: 2.3 mg/dL (ref 1.7–2.4)

## 2019-07-03 LAB — SODIUM, URINE, RANDOM: Sodium, Ur: 67 mmol/L

## 2019-07-03 LAB — CREATININE, URINE, RANDOM: Creatinine, Urine: 51.42 mg/dL

## 2019-07-03 LAB — HIV ANTIBODY (ROUTINE TESTING W REFLEX): HIV Screen 4th Generation wRfx: NONREACTIVE

## 2019-07-03 MED ORDER — SODIUM CHLORIDE 0.9% FLUSH
3.0000 mL | Freq: Two times a day (BID) | INTRAVENOUS | Status: DC
  Administered 2019-07-03 (×2): 3 mL via INTRAVENOUS

## 2019-07-03 MED ORDER — ACETAMINOPHEN 650 MG RE SUPP: 650.0000 mg | Freq: Four times a day (QID) | RECTAL | Status: DC | PRN

## 2019-07-03 MED ORDER — MELATONIN 3 MG PO TABS
6.0000 mg | ORAL_TABLET | Freq: Every evening | ORAL | Status: DC | PRN
  Filled 2019-07-03: qty 2

## 2019-07-03 MED ORDER — POLYETHYLENE GLYCOL 3350 17 G PO PACK: 17.0000 g | PACK | Freq: Every day | ORAL | Status: DC | PRN

## 2019-07-03 MED ORDER — ACETAMINOPHEN 325 MG PO TABS
650.0000 mg | ORAL_TABLET | Freq: Four times a day (QID) | ORAL | Status: DC | PRN
  Administered 2019-07-03: 650 mg via ORAL
  Filled 2019-07-03: qty 2

## 2019-07-03 MED ORDER — GUAIFENESIN-DM 100-10 MG/5ML PO SYRP
5.0000 mL | ORAL_SOLUTION | ORAL | Status: DC | PRN
  Administered 2019-07-03: 5 mL via ORAL
  Filled 2019-07-03: qty 5

## 2019-07-03 MED ORDER — HEPARIN SODIUM (PORCINE) 5000 UNIT/ML IJ SOLN
5000.0000 [IU] | Freq: Three times a day (TID) | INTRAMUSCULAR | Status: DC
  Administered 2019-07-03: 5000 [IU] via SUBCUTANEOUS
  Filled 2019-07-03 (×2): qty 1

## 2019-07-03 MED ORDER — SODIUM CHLORIDE 0.9 % IV SOLN: INTRAVENOUS | Status: DC

## 2019-07-03 MED ORDER — ONDANSETRON HCL 4 MG PO TABS: 4.0000 mg | ORAL_TABLET | Freq: Four times a day (QID) | ORAL | Status: DC | PRN

## 2019-07-03 MED ORDER — ASPIRIN-ACETAMINOPHEN-CAFFEINE 250-250-65 MG PO TABS
1.0000 | ORAL_TABLET | Freq: Once | ORAL | Status: AC
  Administered 2019-07-03: 1 via ORAL
  Filled 2019-07-03: qty 1

## 2019-07-03 MED ORDER — PHENOL 1.4 % MT LIQD: 1.0000 | OROMUCOSAL | Status: DC | PRN

## 2019-07-03 MED ORDER — ONDANSETRON HCL 4 MG/2ML IJ SOLN: 4.0000 mg | Freq: Four times a day (QID) | INTRAMUSCULAR | Status: DC | PRN

## 2019-07-03 MED ORDER — MAGNESIUM SULFATE IN D5W 1-5 GM/100ML-% IV SOLN
1.0000 g | Freq: Once | INTRAVENOUS | Status: AC
  Administered 2019-07-03: 1 g via INTRAVENOUS
  Filled 2019-07-03: qty 100

## 2019-07-03 NOTE — Progress Notes (Addendum)
PROGRESS NOTE    Kimberly Browning  [id number] DOB: 02/03/1976 DOA: 07/02/2019 PCP: Berkley Harvey, NP   Brief Narrative:  Kimberly Browning is a 44 y.o. female with medical history significant for depression, anxiety, insomnia, and vertigo, now presenting to the emergency department for evaluation of shortness of breath and malaise.  Patient reports recent abdominal discomfort, nausea, and lightheadedness few days after she had stopped taking duloxetine, called the prescribing physician on 07/02/2019 who raised concern for withdrawal and recommended that she take a dose which she did.  Since then, she has had improvement in her abdominal discomfort, improvement in nausea, but had some loose stool today and progressive shortness of breath and general malaise. In the ED she was tachycardic with HR up to 140s, had mildly elevated WBC, lactic acid of 4.7, Scr of 1.89, K of 3.1.  COVID-19 and influenza negative.  UA not consistent with UTI.  CTA chest negative for PE but with atelectasis of the lingula and right lower lobe.  O2 sats stable. TSH wnl.   Patient improving with IV fluids but still feeling weak. Has persistent mild leukocytosis. O2 sats remain stable. She remains afebrile. No nausea, vomiting, or diarrhea. Had frontal headache but no confusion or symptoms of meningismus.   Assessment & Plan:   Principal Problem:   SIRS (systemic inflammatory response syndrome) (HCC) Active Problems:   AKI (acute kidney injury) ([place])   Depression with anxiety  1. SIRS with lactic acidosis  - Presented with SOB and malaise, found to be tachycardic and tachypneic with leukocytosis and lactic acidosis, but no fever, no PNA on CTA chest, benign abdominal exam, denies urinary sxs, and no meningismus .  - There was concern for possible serotonin syndrome in light of duloxetine use (and phentermine which can increase serotonin levels), and she was given multiple doses Ativan in ED. However, there was no fever,  clonus, or hyperreflexia on admission  - Culture blood and urine with no growth so far but leukocytosis is persistent.  -She remains afebrile. Tachycardia is improving with IV fluids. Lactic acid is now normal.  -Continue holding duloxetine and phentermine, continue IVF hydration, continue Ativan as needed, follow cultures and clinical course.   -Monitor off antibiotics, check procalcitonin.   2. Acute kidney injury  - SCr is 1.89 in ED, up from 0.9 baseline  - Likely acute prerenal azotemia in setting of recent N/V/D  - She was given 2 liters IVF in ED, will continue IV fluids for now.  - Renally-dose medications, repeat chem panel in am    3. Depression, anxiety  - Hold duloxetine as above, hold temazepam while using Ativan   4. Headache. Mild -Appears to have resolved with Excedrin x1.  -No signs of meningismus. She denies nasal discharge or history of sinus infections. Continue monitoring.   5. Low magnesium level.  Replete as needed. Recheck level in am.    DVT prophylaxis: sq heparin  Code Status: Full  Family Communication: Discussed with the patient.  Consults called: None  Admission status: Changed to IP with anticipated 2MN stay.  Disposition: May be ready for dc in 1-2 days pending clinical improvement.    Procedures:  None Antimicrobials:  None    Subjective: Patient had a headache described as frontal earlier this morning but this is improving. She denies fever, chills, nasal discharge. Her HR is improving. Denies nausea, vomiting, or diarrhea. She feels tired.   Objective: Vitals:   07/03/19 0756 07/03/19 1138 07/03/19 1402  07/03/19 1607  BP: 99/68 114/75  100/74  Pulse: (!) 109 94 84 81  Resp: (!) 25 (!) 23 18 18   Temp: 98.3 F (36.8 C) 98.8 F (37.1 C)  98.1 F (36.7 C)  TempSrc: Oral Oral  Oral  SpO2: 98% 98% 94% 99%  Weight:      Height:       No intake or output data in the 24 hours ending 07/03/19 1618 Filed Weights   07/03/19 0023    Weight: 71.5 kg    Examination:  General exam: Appears calm and comfortable  Respiratory system: . Respiratory effort normal. Cardiovascular system: S1 & S2 present, RRR.  Gastrointestinal system: Abdomen is nondistended, soft and nontender.  Central nervous system: Alert and oriented. No focal neurological deficits. Extremities: Symmetric 5 x 5 power. Psychiatry: Guarded affect.      Data Reviewed: I have personally reviewed following labs and imaging studies  CBC: Recent Labs  Lab 07/02/19 1804 07/02/19 2302 07/03/19 1149  WBC 11.2*  --  11.6*  NEUTROABS 7.7  --  8.4*  HGB 13.8 13.6 11.7*  HCT 40.8 40.0 35.7*  MCV 87.2  --  89.7  PLT 328  --  99991111   Basic Metabolic Panel: Recent Labs  Lab 07/02/19 1804 07/02/19 2302 07/03/19 0102 07/03/19 1149  NA 138 140 136 139  K 3.1* 3.0* 3.2* 4.0  CL 108  --  113* 117*  CO2 15*  --  11* 17*  GLUCOSE 89  --  124* 101*  BUN 15  --  17 9  CREATININE 1.89*  --  1.43* 1.08*  CALCIUM 9.5  --  8.1* 8.2*  MG  --   --  1.4* 2.3   GFR: Estimated Creatinine Clearance: 65.8 mL/min (A) (by C-G formula based on SCr of 1.08 mg/dL (H)). Liver Function Tests: Recent Labs  Lab 07/02/19 1804  AST 19  ALT 16  ALKPHOS 113  BILITOT 1.0  PROT 6.9  ALBUMIN 3.9   No results for input(s): LIPASE, AMYLASE in the last 168 hours. No results for input(s): AMMONIA in the last 168 hours. Coagulation Profile: No results for input(s): INR, PROTIME in the last 168 hours. Cardiac Enzymes: Recent Labs  Lab 07/03/19 0102  CKTOTAL 110   BNP (last 3 results) No results for input(s): PROBNP in the last 8760 hours. HbA1C: No results for input(s): HGBA1C in the last 72 hours. CBG: No results for input(s): GLUCAP in the last 168 hours. Lipid Profile: No results for input(s): CHOL, HDL, LDLCALC, TRIG, CHOLHDL, LDLDIRECT in the last 72 hours. Thyroid Function Tests: Recent Labs    07/02/19 2202  TSH 2.926   Anemia Panel: No results for  input(s): VITAMINB12, FOLATE, FERRITIN, TIBC, IRON, RETICCTPCT in the last 72 hours. Sepsis Labs: Recent Labs  Lab 07/02/19 2001 07/03/19 0102 07/03/19 0311  LATICACIDVEN 4.7* 2.1* 1.0    Recent Results (from the past 240 hour(s))  Respiratory Panel by RT PCR (Flu A&B, Covid) - Nasopharyngeal Swab     Status: None   Collection Time: 07/02/19  6:04 PM   Specimen: Nasopharyngeal Swab  Result Value Ref Range Status   SARS Coronavirus 2 by RT PCR NEGATIVE NEGATIVE Final    Comment: (NOTE) SARS-CoV-2 target nucleic acids are NOT DETECTED. The SARS-CoV-2 RNA is generally detectable in upper respiratoy specimens during the acute phase of infection. The lowest concentration of SARS-CoV-2 viral copies this assay can detect is 131 copies/mL. A negative result does not preclude SARS-Cov-2  infection and should not be used as the sole basis for treatment or other patient management decisions. A negative result may occur with  improper specimen collection/handling, submission of specimen other than nasopharyngeal swab, presence of viral mutation(s) within the areas targeted by this assay, and inadequate number of viral copies (<131 copies/mL). A negative result must be combined with clinical observations, patient history, and epidemiological information. The expected result is Negative. Fact Sheet for Patients:  PinkCheek.be Fact Sheet for Healthcare Providers:  GravelBags.it This test is not yet ap proved or cleared by the [place] FDA and  has been authorized for detection and/or diagnosis of SARS-CoV-2 by FDA under an Emergency Use Authorization (EUA). This EUA will remain  in effect (meaning this test can be used) for the duration of the COVID-19 declaration under Section 564(b)(1) of the Act, 21 U.S.C. section 360bbb-3(b)(1), unless the authorization is terminated or revoked sooner.    Influenza A by PCR NEGATIVE NEGATIVE  Final   Influenza B by PCR NEGATIVE NEGATIVE Final    Comment: (NOTE) The Xpert Xpress SARS-CoV-2/FLU/RSV assay is intended as an aid in  the diagnosis of influenza from Nasopharyngeal swab specimens and  should not be used as a sole basis for treatment. Nasal washings and  aspirates are unacceptable for Xpert Xpress SARS-CoV-2/FLU/RSV  testing. Fact Sheet for Patients: PinkCheek.be Fact Sheet for Healthcare Providers: GravelBags.it This test is not yet approved or cleared by the [place] FDA and  has been authorized for detection and/or diagnosis of SARS-CoV-2 by  FDA under an Emergency Use Authorization (EUA). This EUA will remain  in effect (meaning this test can be used) for the duration of the  Covid-19 declaration under Section 564(b)(1) of the Act, 21  U.S.C. section 360bbb-3(b)(1), unless the authorization is  terminated or revoked. Performed at [facility], [place] [address], [place], [place] [address]   Culture, blood (routine x 2)     Status: None (Preliminary result)   Collection Time: 07/03/19  1:12 AM   Specimen: BLOOD LEFT ARM  Result Value Ref Range Status   Specimen Description BLOOD LEFT ARM  Final   Special Requests   Final    BOTTLES DRAWN AEROBIC ONLY Blood Culture adequate volume   Culture   Final    NO GROWTH < 12 HOURS Performed at [facility], [place] [address], [place], [place] [address]    Report Status PENDING  Incomplete  Culture, blood (routine x 2)     Status: None (Preliminary result)   Collection Time: 07/03/19  1:23 AM   Specimen: BLOOD RIGHT ARM  Result Value Ref Range Status   Specimen Description BLOOD RIGHT ARM  Final   Special Requests   Final    BOTTLES DRAWN AEROBIC ONLY Blood Culture adequate volume   Culture   Final    NO GROWTH < 12 HOURS Performed at [facility], [place] [address], [place], [place] [address]    Report Status PENDING  Incomplete          Radiology Studies: CT Angio Chest PE W and/or Wo Contrast  Result Date: 07/02/2019 CLINICAL DATA:  Shortness of breath EXAM: CT ANGIOGRAPHY CHEST WITH CONTRAST TECHNIQUE: Multidetector CT imaging of the chest was performed using the standard protocol during bolus administration of intravenous contrast. Multiplanar CT image reconstructions and MIPs were obtained to evaluate the vascular anatomy. CONTRAST:  77mL OMNIPAQUE IOHEXOL 350 MG/ML SOLN COMPARISON:  None. FINDINGS: Cardiovascular: Heart is normal size.  Aorta is normal caliber. No filling defects in the pulmonary arteries to suggestpulmonary emboli. Mediastinum/Nodes: No mediastinal, hilar, or axillary adenopathy. Trachea and esophagus are unremarkable. Thyroid unremarkable. Lungs/Pleura: Areas of linear scarring or atelectasis in the lingula and right lower lobe. No effusions. Upper Abdomen: Imaging into the upper abdomen shows no acute findings. Musculoskeletal: Chest wall soft tissues are unremarkable. No acute bony abnormality. Review of the MIP images confirms the above findings. IMPRESSION: No evidence of pulmonary embolus. Areas of atelectasis in the lingula and right lower lobe. Electronically Signed   By: Rolm Baptise M.D.   On: 07/02/2019 21:26   DG Chest Port 1 View  Result Date: 07/02/2019 CLINICAL DATA:  Shortness of breath. EXAM: PORTABLE CHEST 1 VIEW COMPARISON:  None. FINDINGS: A trace amount of linear atelectasis is seen within the right lung base. There is no evidence of a pleural effusion or pneumothorax. The heart size and mediastinal contours are within normal limits. The visualized skeletal structures are unremarkable. IMPRESSION: Trace amount of right basilar linear atelectasis. Electronically Signed   By: Virgina Norfolk M.D.   On: 07/02/2019 18:29        Scheduled Meds: . heparin  5,000 Units Subcutaneous Q8H  . sodium chloride flush  3 mL Intravenous Q12H   Continuous Infusions: . sodium chloride 125  mL/hr at 07/03/19 1159     LOS: 0 days    Time spent: 35 minutes    Blain Pais, MD [facility]   If 7PM-7AM, please contact night-coverage [email or web address] Password [email or web address] 07/03/2019, 4:18 PM

## 2019-07-04 LAB — CBC WITH DIFFERENTIAL/PLATELET
Abs Immature Granulocytes: 0.02 10*3/uL (ref 0.00–0.07)
Basophils Absolute: 0 10*3/uL (ref 0.0–0.1)
Basophils Relative: 0 %
Eosinophils Absolute: 0.3 10*3/uL (ref 0.0–0.5)
Eosinophils Relative: 4 %
HCT: 35 % — ABNORMAL LOW (ref 36.0–46.0)
Hemoglobin: 11.4 g/dL — ABNORMAL LOW (ref 12.0–15.0)
Immature Granulocytes: 0 %
Lymphocytes Relative: 32 %
Lymphs Abs: 2.6 10*3/uL (ref 0.7–4.0)
MCH: 29.1 pg (ref 26.0–34.0)
MCHC: 32.6 g/dL (ref 30.0–36.0)
MCV: 89.3 fL (ref 80.0–100.0)
Monocytes Absolute: 0.6 10*3/uL (ref 0.1–1.0)
Monocytes Relative: 8 %
Neutro Abs: 4.6 10*3/uL (ref 1.7–7.7)
Neutrophils Relative %: 56 %
Platelets: 262 10*3/uL (ref 150–400)
RBC: 3.92 MIL/uL (ref 3.87–5.11)
RDW: 13.5 % (ref 11.5–15.5)
WBC: 8.2 10*3/uL (ref 4.0–10.5)
nRBC: 0 % (ref 0.0–0.2)

## 2019-07-04 LAB — URINE CULTURE: Culture: <10000 — AB

## 2019-07-04 LAB — BASIC METABOLIC PANEL
Anion gap: 7 (ref 5–15)
BUN: 6 mg/dL (ref 6–20)
CO2: 16 mmol/L — ABNORMAL LOW (ref 22–32)
Calcium: 8.2 mg/dL — ABNORMAL LOW (ref 8.9–10.3)
Chloride: 114 mmol/L — ABNORMAL HIGH (ref 98–111)
Creatinine, Ser: 1.03 mg/dL — ABNORMAL HIGH (ref 0.44–1.00)
GFR calc Af Amer: >60 mL/min (ref >60)
GFR calc non Af Amer: >60 mL/min (ref >60)
Glucose, Bld: 94 mg/dL (ref 70–99)
Potassium: 3.4 mmol/L — ABNORMAL LOW (ref 3.5–5.1)
Sodium: 137 mmol/L (ref 135–145)

## 2019-07-04 LAB — PROCALCITONIN: Procalcitonin: <0.1 ng/mL

## 2019-07-04 LAB — MAGNESIUM: Magnesium: 1.9 mg/dL (ref 1.7–2.4)

## 2019-07-04 MED ORDER — ONDANSETRON HCL 4 MG PO TABS: 4.0000 mg | ORAL_TABLET | Freq: Four times a day (QID) | ORAL | 0 refills | Status: DC | PRN

## 2019-07-04 NOTE — Discharge Summary (Signed)
Physician Discharge Summary  Kimberly Browning [id number] DOB: August 24, 1975 DOA: 07/02/2019  PCP: Berkley Harvey, NP  Admit date: 07/02/2019 Discharge date: 07/04/2019  Admitted From: Home Disposition: Home Recommendations for Outpatient Follow-up:  1. Follow up with PCP in 1-2 weeks 2. Please obtain BMP/CBC in one week   Home Health: None Equipment/Devices none Discharge Condition stable and improved CODE STATUS full code Diet recommendation: Cardiac Brief/Interim Summary:44 y.o. female with medical history significant for depression, anxiety, insomnia, and vertigo, now presenting to the emergency department for evaluation of shortness of breath and malaise.  Patient reports recent abdominal discomfort, nausea, and lightheadedness few days after she had stopped taking duloxetine, called the prescribing physician on 07/02/2019 who raised concern for withdrawal and recommended that she take a dose which she did.  Since then, she has had improvement in her abdominal discomfort, improvement in nausea, but had some loose stool today and progressive shortness of breath and general malaise.  She denies any significant cough and denies any fevers or chills.  She denies any chest pain, palpitations, or leg tenderness.  She has a mild headache that she attributes to her impending menstrual period.  She denies any change in vision or hearing or focal numbness or weakness.  Denies any recent fall or trauma.  She denies any alcohol use and denies any recent illicit substance use.  She denies any ingestion or medication misuse.  In the ED she was tachycardic with HR up to 140s, had mildly elevated WBC, lactic acid of 4.7, Scr of 1.89, K of 3.1.  COVID-19 and influenza negative.  UA not consistent with UTI.  CTA chest negative for PE but with atelectasis of the lingula and right lower lobe.  O2 sats stable. TSH wnl.   Discharge Diagnoses:  Principal Problem:   SIRS (systemic inflammatory response syndrome)  (HCC) Active Problems:   AKI (acute kidney injury) ([place])   Depression with anxiety   1.SIRSwith lactic acidosis  -Presented with SOB and malaise, found to be tachycardic and tachypneic with leukocytosis and lactic acidosis, but no fever, no PNA on CTA chest, benign abdominal exam, denies urinary sxs, and no meningismus.   She remained afebrile with normal vital signs. -There was concern for possible serotonin syndrome in light of duloxetine use (and phentermine which can increase serotonin levels), and she was given multiple doses Ativan in ED. However, there was no fever, clonus, or hyperreflexia on admission -Culture blood and urine with no growth so far she had mild leukocytosis which has been resolved on discharge.   He did not receive any antibiotics during the hospital stay.  She received IV hydration.  2.Acute kidney injury-resolved with IV fluids.  3.Depression, anxiety-I have stopped the phentermine on discharge continue Restoril and duloxetine.  4. Headache. Mild -Appears to have resolved with Excedrin x1.  -No signs of meningismus. She denies nasal discharge or history of sinus infections.   5. Low magnesium level.  Repleted magnesium 1.9 at the time of discharge.   Estimated body mass index is 27.06 kg/m as calculated from the following:   Height as of this encounter: 5\' 4"  (1.626 m).   Weight as of this encounter: 71.5 kg.  Discharge Instructions  Discharge Instructions    Call MD for:  difficulty breathing, headache or visual disturbances   Complete by: As directed    Call MD for:  persistant nausea and vomiting   Complete by: As directed    Call MD for:  severe uncontrolled pain  Complete by: As directed    Diet - low sodium heart healthy   Complete by: As directed    Increase activity slowly   Complete by: As directed      Allergies as of 07/04/2019      Reactions   Geodon [ziprasidone Hydrochloride] Shortness Of Breath, Swelling    Hibiclens [chlorhexidine] Itching   Redness and itching after using CHG wipes   Sulfamethoxazole-trimethoprim Other (See Comments)   hallucinations   Doxycycline Other (See Comments)   Unknown reaction   Gabapentin Other (See Comments)   Unknown reaction   Other Other (See Comments)   Bath and body works body wash and lotion caused itching, reddness and rash   Penicillins Other (See Comments)   Unknown reaction Did it involve swelling of the face/tongue/throat, SOB, or low BP? Unknown Did it involve sudden or severe rash/hives, skin peeling, or any reaction on the inside of your mouth or nose? Unknown Did you need to seek medical attention at a hospital or doctor's office? Unknown When did it last happen?unknown If all above answers are "NO", may proceed with cephalosporin use.   Promethazine Other (See Comments)   Depakote [divalproex Sodium] Rash      Medication List    STOP taking these medications   phentermine 37.5 MG capsule     TAKE these medications   DULoxetine 60 MG capsule Commonly known as: CYMBALTA Take 60 mg by mouth daily.   ondansetron 4 MG tablet Commonly known as: ZOFRAN Take 1 tablet (4 mg total) by mouth every 6 (six) hours as needed for nausea.   RABEprazole 20 MG tablet Commonly known as: ACIPHEX Take 20 mg by mouth daily.   temazepam 15 MG capsule Commonly known as: RESTORIL Take 45 mg by mouth at bedtime.      Follow-up Information    Berkley Harvey, NP Follow up.   Specialty: Nurse Practitioner Contact information: [address]  [place] [address] [phone]          Allergies  Allergen Reactions  . Geodon [Ziprasidone Hydrochloride] Shortness Of Breath and Swelling  . Hibiclens [Chlorhexidine] Itching    Redness and itching after using CHG wipes  . Sulfamethoxazole-Trimethoprim Other (See Comments)    hallucinations  . Doxycycline Other (See Comments)    Unknown reaction  . Gabapentin Other (See Comments)     Unknown reaction  . Other Other (See Comments)    Bath and body works body wash and lotion caused itching, reddness and rash  . Penicillins Other (See Comments)    Unknown reaction Did it involve swelling of the face/tongue/throat, SOB, or low BP? Unknown Did it involve sudden or severe rash/hives, skin peeling, or any reaction on the inside of your mouth or nose? Unknown Did you need to seek medical attention at a hospital or doctor's office? Unknown When did it last happen?unknown If all above answers are "NO", may proceed with cephalosporin use.  . Promethazine Other (See Comments)  . Depakote [Divalproex Sodium] Rash    Consultations: None  Procedures/Studies: CT Angio Chest PE W and/or Wo Contrast  Result Date: 07/02/2019 CLINICAL DATA:  Shortness of breath EXAM: CT ANGIOGRAPHY CHEST WITH CONTRAST TECHNIQUE: Multidetector CT imaging of the chest was performed using the standard protocol during bolus administration of intravenous contrast. Multiplanar CT image reconstructions and MIPs were obtained to evaluate the vascular anatomy. CONTRAST:  15mL OMNIPAQUE IOHEXOL 350 MG/ML SOLN COMPARISON:  None. FINDINGS: Cardiovascular: Heart is normal size. Aorta is  normal caliber. No filling defects in the pulmonary arteries to suggestpulmonary emboli. Mediastinum/Nodes: No mediastinal, hilar, or axillary adenopathy. Trachea and esophagus are unremarkable. Thyroid unremarkable. Lungs/Pleura: Areas of linear scarring or atelectasis in the lingula and right lower lobe. No effusions. Upper Abdomen: Imaging into the upper abdomen shows no acute findings. Musculoskeletal: Chest wall soft tissues are unremarkable. No acute bony abnormality. Review of the MIP images confirms the above findings. IMPRESSION: No evidence of pulmonary embolus. Areas of atelectasis in the lingula and right lower lobe. Electronically Signed   By: Rolm Baptise M.D.   On: 07/02/2019 21:26   DG Chest Port 1 View  Result  Date: 07/02/2019 CLINICAL DATA:  Shortness of breath. EXAM: PORTABLE CHEST 1 VIEW COMPARISON:  None. FINDINGS: A trace amount of linear atelectasis is seen within the right lung base. There is no evidence of a pleural effusion or pneumothorax. The heart size and mediastinal contours are within normal limits. The visualized skeletal structures are unremarkable. IMPRESSION: Trace amount of right basilar linear atelectasis. Electronically Signed   By: Virgina Norfolk M.D.   On: 07/02/2019 18:29    (Echo, Carotid, EGD, Colonoscopy, ERCP)    Subjective: Patient resting in bed she was asleep when I walked in the room I was able to wake her up she answered all my questions appropriately denies any nausea vomiting diarrhea chest pain shortness of breath palpitations.  Per nursing she ate all her food.  Discharge Exam: Vitals:   07/03/19 1934 07/04/19 0500  BP: 112/77 112/69  Pulse: 79 84  Resp: (!) 30 20  Temp: (!) 97.5 F (36.4 C) 98.2 F (36.8 C)  SpO2:  94%   Vitals:   07/03/19 1402 07/03/19 1607 07/03/19 1934 07/04/19 0500  BP:  100/74 112/77 112/69  Pulse: 84 81 79 84  Resp: 18 18 (!) 30 20  Temp:  98.1 F (36.7 C) (!) 97.5 F (36.4 C) 98.2 F (36.8 C)  TempSrc:  Oral Oral Oral  SpO2: 94% 99%  94%  Weight:      Height:        General: Pt is alert, awake, not in acute distress Cardiovascular: RRR, S1/S2 +, no rubs, no gallops Respiratory: CTA bilaterally, no wheezing, no rhonchi Abdominal: Soft, NT, ND, bowel sounds + Extremities: no edema, no cyanosis    The results of significant diagnostics from this hospitalization (including imaging, microbiology, ancillary and laboratory) are listed below for reference.     Microbiology: Recent Results (from the past 240 hour(s))  Respiratory Panel by RT PCR (Flu A&B, Covid) - Nasopharyngeal Swab     Status: None   Collection Time: 07/02/19  6:04 PM   Specimen: Nasopharyngeal Swab  Result Value Ref Range Status   SARS  Coronavirus 2 by RT PCR NEGATIVE NEGATIVE Final    Comment: (NOTE) SARS-CoV-2 target nucleic acids are NOT DETECTED. The SARS-CoV-2 RNA is generally detectable in upper respiratoy specimens during the acute phase of infection. The lowest concentration of SARS-CoV-2 viral copies this assay can detect is 131 copies/mL. A negative result does not preclude SARS-Cov-2 infection and should not be used as the sole basis for treatment or other patient management decisions. A negative result may occur with  improper specimen collection/handling, submission of specimen other than nasopharyngeal swab, presence of viral mutation(s) within the areas targeted by this assay, and inadequate number of viral copies (<131 copies/mL). A negative result must be combined with clinical observations, patient history, and epidemiological information. The expected result is  Negative. Fact Sheet for Patients:  PinkCheek.be Fact Sheet for Healthcare Providers:  GravelBags.it This test is not yet ap proved or cleared by the [place] FDA and  has been authorized for detection and/or diagnosis of SARS-CoV-2 by FDA under an Emergency Use Authorization (EUA). This EUA will remain  in effect (meaning this test can be used) for the duration of the COVID-19 declaration under Section 564(b)(1) of the Act, 21 U.S.C. section 360bbb-3(b)(1), unless the authorization is terminated or revoked sooner.    Influenza A by PCR NEGATIVE NEGATIVE Final   Influenza B by PCR NEGATIVE NEGATIVE Final    Comment: (NOTE) The Xpert Xpress SARS-CoV-2/FLU/RSV assay is intended as an aid in  the diagnosis of influenza from Nasopharyngeal swab specimens and  should not be used as a sole basis for treatment. Nasal washings and  aspirates are unacceptable for Xpert Xpress SARS-CoV-2/FLU/RSV  testing. Fact Sheet for Patients: PinkCheek.be Fact Sheet  for Healthcare Providers: GravelBags.it This test is not yet approved or cleared by the [place] FDA and  has been authorized for detection and/or diagnosis of SARS-CoV-2 by  FDA under an Emergency Use Authorization (EUA). This EUA will remain  in effect (meaning this test can be used) for the duration of the  Covid-19 declaration under Section 564(b)(1) of the Act, 21  U.S.C. section 360bbb-3(b)(1), unless the authorization is  terminated or revoked. Performed at [facility], [place] [address] [place], [place] [address]   Culture, blood (routine x 2)     Status: None (Preliminary result)   Collection Time: 07/03/19  1:12 AM   Specimen: BLOOD LEFT ARM  Result Value Ref Range Status   Specimen Description BLOOD LEFT ARM  Final   Special Requests   Final    BOTTLES DRAWN AEROBIC ONLY Blood Culture adequate volume   Culture   Final    NO GROWTH 1 DAY Performed at [facility], [place] [address] [place], [place] [address]    Report Status PENDING  Incomplete  Culture, blood (routine x 2)     Status: None (Preliminary result)   Collection Time: 07/03/19  1:23 AM   Specimen: BLOOD RIGHT ARM  Result Value Ref Range Status   Specimen Description BLOOD RIGHT ARM  Final   Special Requests   Final    BOTTLES DRAWN AEROBIC ONLY Blood Culture adequate volume   Culture   Final    NO GROWTH 1 DAY Performed at [facility], [place] [address], [place],  [address]    Report Status PENDING  Incomplete  Culture, Urine     Status: Abnormal   Collection Time: 07/03/19  6:22 AM   Specimen: Urine, Random  Result Value Ref Range Status   Specimen Description URINE, RANDOM  Final   Special Requests NONE  Final   Culture (A)  Final    <10,000 COLONIES/mL INSIGNIFICANT GROWTH Performed at [facility], [place] [address] [place],  [address]    Report Status 07/04/2019 FINAL  Final     Labs: BNP (last 3 results) No results for  input(s): BNP in the last 8760 hours. Basic Metabolic Panel: Recent Labs  Lab 07/02/19 1804 07/02/19 2302 07/03/19 0102 07/03/19 1149 07/03/19 2349 07/04/19 0240  NA 138 140 136 139 137  --   K 3.1* 3.0* 3.2* 4.0 3.4*  --   CL 108  --  113* 117* 114*  --   CO2 15*  --  11* 17*  16*  --   GLUCOSE 89  --  124* 101* 94  --   BUN 15  --  17 9 6   --   CREATININE 1.89*  --  1.43* 1.08* 1.03*  --   CALCIUM 9.5  --  8.1* 8.2* 8.2*  --   MG  --   --  1.4* 2.3  --  1.9   Liver Function Tests: Recent Labs  Lab 07/02/19 1804  AST 19  ALT 16  ALKPHOS 113  BILITOT 1.0  PROT 6.9  ALBUMIN 3.9   No results for input(s): LIPASE, AMYLASE in the last 168 hours. No results for input(s): AMMONIA in the last 168 hours. CBC: Recent Labs  Lab 07/02/19 1804 07/02/19 2302 07/03/19 1149 07/04/19 0240  WBC 11.2*  --  11.6* 8.2  NEUTROABS 7.7  --  8.4* 4.6  HGB 13.8 13.6 11.7* 11.4*  HCT 40.8 40.0 35.7* 35.0*  MCV 87.2  --  89.7 89.3  PLT 328  --  271 262   Cardiac Enzymes: Recent Labs  Lab 07/03/19 0102  CKTOTAL 110   BNP: Invalid input(s): POCBNP CBG: No results for input(s): GLUCAP in the last 168 hours. D-Dimer Recent Labs    07/02/19 1804  DDIMER 0.65*   Hgb A1c No results for input(s): HGBA1C in the last 72 hours. Lipid Profile No results for input(s): CHOL, HDL, LDLCALC, TRIG, CHOLHDL, LDLDIRECT in the last 72 hours. Thyroid function studies Recent Labs    07/02/19 2202  TSH 2.926   Anemia work up No results for input(s): VITAMINB12, FOLATE, FERRITIN, TIBC, IRON, RETICCTPCT in the last 72 hours. Urinalysis    Component Value Date/Time   COLORURINE STRAW (A) 07/03/2019 0639   APPEARANCEUR CLEAR 07/03/2019 0639   LABSPEC 1.015 07/03/2019 0639   PHURINE 5.0 07/03/2019 0639   GLUCOSEU NEGATIVE 07/03/2019 0639   HGBUR NEGATIVE 07/03/2019 0639   HGBUR negative 04/24/2009 [place] 07/03/2019 [place] 07/03/2019 0639    PROTEINUR NEGATIVE 07/03/2019 0639   UROBILINOGEN 0.2 04/24/2009 1100   NITRITE NEGATIVE 07/03/2019 0639   LEUKOCYTESUR NEGATIVE 07/03/2019 0639   Sepsis Labs Invalid input(s): PROCALCITONIN,  WBC,  LACTICIDVEN Microbiology Recent Results (from the past 240 hour(s))  Respiratory Panel by RT PCR (Flu A&B, Covid) - Nasopharyngeal Swab     Status: None   Collection Time: 07/02/19  6:04 PM   Specimen: Nasopharyngeal Swab  Result Value Ref Range Status   SARS Coronavirus 2 by RT PCR NEGATIVE NEGATIVE Final    Comment: (NOTE) SARS-CoV-2 target nucleic acids are NOT DETECTED. The SARS-CoV-2 RNA is generally detectable in upper respiratoy specimens during the acute phase of infection. The lowest concentration of SARS-CoV-2 viral copies this assay can detect is 131 copies/mL. A negative result does not preclude SARS-Cov-2 infection and should not be used as the sole basis for treatment or other patient management decisions. A negative result may occur with  improper specimen collection/handling, submission of specimen other than nasopharyngeal swab, presence of viral mutation(s) within the areas targeted by this assay, and inadequate number of viral copies (<131 copies/mL). A negative result must be combined with clinical observations, patient history, and epidemiological information. The expected result is Negative. Fact Sheet for Patients:  PinkCheek.be Fact Sheet for Healthcare Providers:  GravelBags.it This test is not yet ap proved or cleared by the [place] FDA and  has been authorized for detection and/or diagnosis of SARS-CoV-2 by FDA under an Emergency Use Authorization (  EUA). This EUA will remain  in effect (meaning this test can be used) for the duration of the COVID-19 declaration under Section 564(b)(1) of the Act, 21 U.S.C. section 360bbb-3(b)(1), unless the authorization is terminated or revoked sooner.     Influenza A by PCR NEGATIVE NEGATIVE Final   Influenza B by PCR NEGATIVE NEGATIVE Final    Comment: (NOTE) The Xpert Xpress SARS-CoV-2/FLU/RSV assay is intended as an aid in  the diagnosis of influenza from Nasopharyngeal swab specimens and  should not be used as a sole basis for treatment. Nasal washings and  aspirates are unacceptable for Xpert Xpress SARS-CoV-2/FLU/RSV  testing. Fact Sheet for Patients: PinkCheek.be Fact Sheet for Healthcare Providers: GravelBags.it This test is not yet approved or cleared by the [place] FDA and  has been authorized for detection and/or diagnosis of SARS-CoV-2 by  FDA under an Emergency Use Authorization (EUA). This EUA will remain  in effect (meaning this test can be used) for the duration of the  Covid-19 declaration under Section 564(b)(1) of the Act, 21  U.S.C. section 360bbb-3(b)(1), unless the authorization is  terminated or revoked. Performed at [facility], [place] [address] [place], [place] [address]   Culture, blood (routine x 2)     Status: None (Preliminary result)   Collection Time: 07/03/19  1:12 AM   Specimen: BLOOD LEFT ARM  Result Value Ref Range Status   Specimen Description BLOOD LEFT ARM  Final   Special Requests   Final    BOTTLES DRAWN AEROBIC ONLY Blood Culture adequate volume   Culture   Final    NO GROWTH 1 DAY Performed at [facility], [place] [address] [place], [place] [address]    Report Status PENDING  Incomplete  Culture, blood (routine x 2)     Status: None (Preliminary result)   Collection Time: 07/03/19  1:23 AM   Specimen: BLOOD RIGHT ARM  Result Value Ref Range Status   Specimen Description BLOOD RIGHT ARM  Final   Special Requests   Final    BOTTLES DRAWN AEROBIC ONLY Blood Culture adequate volume   Culture   Final    NO GROWTH 1 DAY Performed at [facility], [place] [address], [place], [place] [address]    Report Status  PENDING  Incomplete  Culture, Urine     Status: Abnormal   Collection Time: 07/03/19  6:22 AM   Specimen: Urine, Random  Result Value Ref Range Status   Specimen Description URINE, RANDOM  Final   Special Requests NONE  Final   Culture (A)  Final    <10,000 COLONIES/mL INSIGNIFICANT GROWTH Performed at [facility], [place] [address] [place], [place] [address]    Report Status 07/04/2019 FINAL  Final     Time coordinating discharge: 39 minutes minutes  SIGNED:   Georgette Shell, MD  [facility] 07/04/2019, 12:16 PM  If 7PM-7AM, please contact night-coverage [email or web address] Password [email or web address]

## 2019-07-04 NOTE — Plan of Care (Signed)
DISCHARGE NOTE HOME Kimberly Browning to be discharged home per MD order. Discussed prescriptions and follow up appointments with the patient. Prescriptions given to patient; medication list explained in detail. Patient verbalized understanding.  Skin clean, dry and intact without evidence of skin break down, no evidence of skin tears noted. IV catheter discontinued intact. Site without signs and symptoms of complications. Dressing and pressure applied. Pt denies pain at the site currently. No complaints noted.  Patient free of lines, drains, and wounds.   An After Visit Summary (AVS) was printed and given to the patient. Patient escorted via wheelchair, and discharged home via private auto.  Stephan Minister, RN

## 2019-07-04 NOTE — Evaluation (Addendum)
Physical Therapy Evaluation Patient Details Name: Kimberly Browning MRN: [id number] DOB: January 18, 1976 Today's Date: 07/04/2019   History of Present Illness  44 y.o. female with medical history significant for depression, anxiety, insomnia, and vertigo, now presenting to the emergency department for evaluation of shortness of breath and malaise. Admitted with SIRS and AKI.   Clinical Impression  Patient evaluated by Physical Therapy with no further acute Kimberly Browning needs identified. Kimberly Browning reporting cough but otherwise no change from baseline. Ambulating hallway distances without an assistive device; SpO2 100% on RA, HR peak 105 bpm. All education has been completed and the patient has no further questions. No follow-up Physical Therapy or equipment needs. Kimberly Browning is signing off. Thank you for this referral.     Follow Up Recommendations No Kimberly Browning follow up    Equipment Recommendations  None recommended by Kimberly Browning    Recommendations for Other Services       Precautions / Restrictions Precautions Precautions: None Restrictions Weight Bearing Restrictions: No      Mobility  Bed Mobility Overal bed mobility: Independent                Transfers Overall transfer level: Independent Equipment used: None                Ambulation/Gait Ambulation/Gait assistance: Independent Gait Distance (Feet): 180 Feet Assistive device: None Gait Pattern/deviations: Step-through pattern;Wide base of support   Gait velocity interpretation: >2.62 ft/sec, indicative of community ambulatory General Gait Details: No gross unsteadiness, good speed  Stairs            Wheelchair Mobility    Modified Rankin (Stroke Patients Only)       Balance Overall balance assessment: No apparent balance deficits (not formally assessed)                                           Pertinent Vitals/Pain Pain Assessment: No/denies pain    Home Living Family/patient expects to be discharged to::  Private residence Living Arrangements: Alone   Type of Home: Mobile home Home Access: Stairs to enter   [employer] of Steps: 3          Prior Function Level of Independence: Independent         Comments: On disability, does not drive. Independent with IADL's; community ambulatory     Hand Dominance        Extremity/Trunk Assessment   Upper Extremity Assessment Upper Extremity Assessment: Overall WFL for tasks assessed    Lower Extremity Assessment Lower Extremity Assessment: Overall WFL for tasks assessed    Cervical / Trunk Assessment Cervical / Trunk Assessment: Normal  Communication   Communication: No difficulties  Cognition Arousal/Alertness: Awake/alert Behavior During Therapy: WFL for tasks assessed/performed Overall Cognitive Status: No family/caregiver present to determine baseline cognitive functioning                                 General Comments: Somewhat manic/impulsive behaviors; unsure of baseline.      General Comments      Exercises     Assessment/Plan    Kimberly Browning Assessment Patent does not need any further Kimberly Browning services  Kimberly Browning Problem List         Kimberly Browning Treatment Interventions      Kimberly Browning Goals (Current goals can be found in the Care  Plan section)  Acute Rehab Kimberly Browning Goals Patient Stated Goal: "find out what's going on with me." Kimberly Browning Goal Formulation: All assessment and education complete, DC therapy    Frequency     Barriers to discharge        Co-evaluation               AM-PAC Kimberly Browning "6 Clicks" Mobility  Outcome Measure Help needed turning from your back to your side while in a flat bed without using bedrails?: None Help needed moving from lying on your back to sitting on the side of a flat bed without using bedrails?: None Help needed moving to and from a bed to a chair (including a wheelchair)?: None Help needed standing up from a chair using your arms (e.g., wheelchair or bedside chair)?: None Help needed  to walk in hospital room?: None Help needed climbing 3-5 steps with a railing? : None 6 Click Score: 24    End of Session   Activity Tolerance: Patient tolerated treatment well Patient left: in bed;with call bell/phone within reach Nurse Communication: Mobility status Kimberly Browning Visit Diagnosis: Muscle weakness (generalized) (M62.81)    Time: [id number] Kimberly Browning Time Calculation (min) (ACUTE ONLY): 8 min   Charges:   Kimberly Browning Evaluation $Kimberly Browning Eval Low Complexity: 1 Low          Kimberly Browning, Kimberly Browning, Kimberly Browning Acute Rehabilitation Services Pager [phone] Office [phone]   Kimberly Browning 07/04/2019, 2:13 PM

## 2019-07-08 ENCOUNTER — Encounter (HOSPITAL_COMMUNITY): Payer: Self-pay | Admitting: Emergency Medicine

## 2019-07-08 ENCOUNTER — Other Ambulatory Visit: Payer: Self-pay

## 2019-07-08 ENCOUNTER — Emergency Department (HOSPITAL_COMMUNITY): Payer: Medicare Other

## 2019-07-08 ENCOUNTER — Emergency Department (HOSPITAL_COMMUNITY)
Admission: EM | Admit: 2019-07-08 | Discharge: 2019-07-08 | Disposition: A | Discharge status: Home / Self Care | Payer: Medicare Other | Attending: Emergency Medicine | Admitting: Emergency Medicine

## 2019-07-08 DIAGNOSIS — R0789 Other chest pain: Secondary | ICD-10-CM | POA: Diagnosis present

## 2019-07-08 DIAGNOSIS — F17210 Nicotine dependence, cigarettes, uncomplicated: Secondary | ICD-10-CM | POA: Diagnosis not present

## 2019-07-08 DIAGNOSIS — Z79899 Other long term (current) drug therapy: Secondary | ICD-10-CM | POA: Insufficient documentation

## 2019-07-08 LAB — CBC
HCT: 40.9 % (ref 36.0–46.0)
Hemoglobin: 13.6 g/dL (ref 12.0–15.0)
MCH: 29.6 pg (ref 26.0–34.0)
MCHC: 33.3 g/dL (ref 30.0–36.0)
MCV: 88.9 fL (ref 80.0–100.0)
Platelets: 401 10*3/uL — ABNORMAL HIGH (ref 150–400)
RBC: 4.6 MIL/uL (ref 3.87–5.11)
RDW: 13.2 % (ref 11.5–15.5)
WBC: 7.4 10*3/uL (ref 4.0–10.5)
nRBC: 0 % (ref 0.0–0.2)

## 2019-07-08 LAB — BASIC METABOLIC PANEL
Anion gap: 15 (ref 5–15)
BUN: 10 mg/dL (ref 6–20)
CO2: 18 mmol/L — ABNORMAL LOW (ref 22–32)
Calcium: 9.6 mg/dL (ref 8.9–10.3)
Chloride: 103 mmol/L (ref 98–111)
Creatinine, Ser: 1.18 mg/dL — ABNORMAL HIGH (ref 0.44–1.00)
GFR calc Af Amer: >60 mL/min (ref >60)
GFR calc non Af Amer: 57 mL/min — ABNORMAL LOW (ref >60)
Glucose, Bld: 76 mg/dL (ref 70–99)
Potassium: 3.2 mmol/L — ABNORMAL LOW (ref 3.5–5.1)
Sodium: 136 mmol/L (ref 135–145)

## 2019-07-08 LAB — CULTURE, BLOOD (ROUTINE X 2)
Culture: NO GROWTH
Culture: NO GROWTH
Special Requests: ADEQUATE
Special Requests: ADEQUATE

## 2019-07-08 LAB — MAGNESIUM: Magnesium: 1.8 mg/dL (ref 1.7–2.4)

## 2019-07-08 LAB — I-STAT BETA HCG BLOOD, ED (MC, WL, AP ONLY): I-stat hCG, quantitative: <5 m[IU]/mL (ref <5)

## 2019-07-08 LAB — TROPONIN I (HIGH SENSITIVITY)
Troponin I (High Sensitivity): <2 ng/L (ref <18)
Troponin I (High Sensitivity): <2 ng/L (ref <18)

## 2019-07-08 MED ORDER — POTASSIUM CHLORIDE CRYS ER 20 MEQ PO TBCR: 20.0000 meq | EXTENDED_RELEASE_TABLET | Freq: Every day | ORAL | 0 refills | Status: DC

## 2019-07-08 MED ORDER — SODIUM CHLORIDE 0.9 % IV BOLUS
1000.0000 mL | Freq: Once | INTRAVENOUS | Status: AC
  Administered 2019-07-08: 1000 mL via INTRAVENOUS

## 2019-07-08 MED ORDER — LORAZEPAM 2 MG/ML IJ SOLN: 1.0000 mg | Freq: Once | INTRAMUSCULAR | Status: DC

## 2019-07-08 MED ORDER — LORAZEPAM 1 MG PO TABS
1.0000 mg | ORAL_TABLET | Freq: Once | ORAL | Status: AC
  Administered 2019-07-08: 21:00:00 1 mg via ORAL
  Filled 2019-07-08: qty 1

## 2019-07-08 MED ORDER — POTASSIUM CHLORIDE CRYS ER 20 MEQ PO TBCR
20.0000 meq | EXTENDED_RELEASE_TABLET | Freq: Once | ORAL | Status: AC
  Administered 2019-07-08: 21:00:00 20 meq via ORAL
  Filled 2019-07-08: qty 1

## 2019-07-08 NOTE — ED Notes (Signed)
Discharge instructions reviewed with pt. Pt verbalized understanding.   

## 2019-07-08 NOTE — Discharge Instructions (Addendum)
You have been seen today for chest pain and tingling in your face. Please read and follow all provided instructions. Return to the emergency room for worsening condition or new concerning symptoms.    1. Medications:  Prescription sent to your pharmacy for potassium pills.  Please take these as prescribed.  You should take these because your potassium levels were low and your blood work  Continue usual home medications Take medications as prescribed. Please review all of the medicines and only take them if you do not have an allergy to them.   2. Treatment: rest, drink plenty of fluids.  Eat foods high in potassium  3. Follow Up:  Please follow up with primary care provider by scheduling an appointment as soon as possible for a visit  -You should have your potassium rechecked by your primary care doctor after you finish taking the potassium pills.  -A referral has been sent to the heart doctors.  Her EKG was abnormal and after discussing it with the heart doctor on-call they want you to follow-up in clinic for further testing.   It is also a possibility that you have an allergic reaction to any of the medicines that you have been prescribed - Everybody reacts differently to medications and while MOST people have no trouble with most medicines, you may have a reaction such as nausea, vomiting, rash, swelling, shortness of breath. If this is the case, please stop taking the medicine immediately and contact your physician.  ?

## 2019-07-08 NOTE — ED Provider Notes (Signed)
[facility] EMERGENCY DEPARTMENT Provider Note   CSN: [id number] Arrival date & time: 07/08/19  1715     History Chief Complaint  Patient presents with  . Chest Pain    Kimberly Browning is a 44 y.o. female with past medical history significant for bipolar disorder, GERD, hyperlipidemia presents to emergency department today with chief complaint of chest pain and tingling in her face.  Onset was acute starting 1 hour prior to arrival.  Patient states her symptoms started while she was watching the [place] girls.  She describes the chest pain as more of a pressure sensation.  It is located in the middle of her chest and not radiate.  She rated the pain 5 out of 10 in severity.  She is also describing the tingling in her face and bilateral hands. She did not take any symptoms for medication prior to arrival. She denies any fever, chills, visual changes, headache, neck pain, shortness of breath,  abdominal pain, nausea, vomiting, urinary symptoms, diarrhea, lightheadedness, numbness, seizures, syncope, palpitations.  Of note patient was recently admitted to the hospital on 07/01/2018 after she was found to be septic with lactic acidosis in the emergency department.  She also had an AKI that resolved with IV fluids by time of discharge.   Past Medical History:  Diagnosis Date  . Bipolar disorder ([place])   . Depression   . Dry skin    hands  . Feeling of incomplete bladder emptying   . GERD (gastroesophageal reflux disease)   . Hypercholesteremia   . Nocturia   . Urge urinary incontinence   . Vitamin D deficiency   . Wears contact lenses     Patient Active Problem List   Diagnosis Date Noted  . SIRS (systemic inflammatory response syndrome) ([place]) 07/02/2019  . AKI (acute kidney injury) ([place]) 07/02/2019  . Depression with anxiety   . TOBACCO USER 06/09/2009  . FETAL ALCOHOL SYNDROME 01/23/2009  . MEMORY LOSS 01/23/2009  . MOLAR PREGNANCY 12/12/2008  . INSOMNIA  12/11/2008  . 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 BACK PAIN, LUMBAR 10/10/2007  . OBESITY NOS 02/22/2007  . HYPERLIPIDEMIA 01/03/2007  . 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 BIPOLAR DISORDER 08/04/2006  . SEBORRHEIC DERMATITIS, NOS 08/04/2006    Past Surgical History:  Procedure Laterality Date  . BREAST REDUCTION SURGERY  05/10/2011   Procedure: MAMMARY REDUCTION BILATERAL (BREAST);  Surgeon: Hermelinda Dellen;  Location: [facility];  Service: Plastics;  Laterality: Bilateral;  bilateral breast reduction  . DILATION AND EVACUATION  12/26/2008  . INTERSTIM IMPLANT PLACEMENT N/A 07/09/2014   Procedure: Barrie Lyme IMPLANT FIRST STAGE;  Surgeon: Reece Packer, MD;  Location: [facility];  Service: Urology;  Laterality: N/A;  . INTERSTIM IMPLANT PLACEMENT N/A 07/09/2014   Procedure: Barrie Lyme IMPLANT SECOND STAGE;  Surgeon: Reece Packer, MD;  Location: [facility];  Service: Urology;  Laterality: N/A;  . INTRAUTERINE DEVICE (IUD) INSERTION  sept 2011   mirena  . WISDOM TOOTH EXTRACTION  as a teenager     OB History   No obstetric history on file.     Family History  Adopted: Yes    Social History   Tobacco Use  . Smoking status: Current Every Day Smoker    Packs/day: 0.25    Years: 19.00    Pack years: 4.75    Types: Cigarettes  . Smokeless tobacco: Never Used  . Tobacco comment:  1 pp3d  Substance Use Topics  .  Alcohol use: No  . Drug use: No    Home Medications Prior to Admission medications   Medication Sig Start Date End Date Taking? Authorizing Provider  phentermine 37.5 MG capsule Take 37.5 mg by mouth daily.   Yes [provider]  RABEprazole (ACIPHEX) 20 MG tablet Take 20 mg by mouth daily. 05/13/19  Yes [provider]  temazepam (RESTORIL) 15 MG capsule Take 45 mg by mouth at bedtime. 06/12/19  Yes [provider]  potassium chloride SA (KLOR-CON) 20 MEQ tablet Take 1 tablet (20 mEq  total) by mouth daily for 5 days. 07/08/19 07/13/19  Giorgia Wahler E, PA-C    Allergies    Geodon [ziprasidone hydrochloride], Hibiclens [chlorhexidine], Sulfamethoxazole-trimethoprim, Doxycycline, Gabapentin, Other, Penicillins, Promethazine, and Depakote [divalproex sodium]  Review of Systems   Review of Systems All other systems are reviewed and are negative for acute change except as noted in the HPI.  Physical Exam Updated Vital Signs BP 113/78   Pulse 66   Temp (!) 97.5 F (36.4 C) (Oral)   Resp (!) 22   LMP 07/06/2019   SpO2 100%   Physical Exam Vitals and nursing note reviewed.  Constitutional:      General: She is not in acute distress.    Appearance: She is not ill-appearing.  HENT:     Head: Normocephalic and atraumatic.     Right Ear: Tympanic membrane and external ear normal.     Left Ear: Tympanic membrane and external ear normal.     Nose: Nose normal.     Mouth/Throat:     Mouth: Mucous membranes are moist.     Pharynx: Oropharynx is clear.  Eyes:     General: No scleral icterus.       Right eye: No discharge.        Left eye: No discharge.     Extraocular Movements: Extraocular movements intact.     Conjunctiva/sclera: Conjunctivae normal.     Pupils: Pupils are equal, round, and reactive to light.  Neck:     Vascular: No JVD.  Cardiovascular:     Rate and Rhythm: Normal rate and regular rhythm.     Pulses: Normal pulses.          Radial pulses are 2+ on the right side and 2+ on the left side.     Heart sounds: Normal heart sounds.  Pulmonary:     Comments: Lungs clear to auscultation in all fields. Symmetric chest rise. No wheezing, rales, or rhonchi. Abdominal:     Comments: Abdomen is soft, non-distended, and non-tender in all quadrants. No rigidity, no guarding. No peritoneal signs.  Musculoskeletal:        General: Normal range of motion.     Cervical back: Normal range of motion.  Skin:    General: Skin is warm and dry.     Capillary  Refill: Capillary refill takes less than 2 seconds.  Neurological:     Mental Status: She is oriented to person, place, and time.     GCS: GCS eye subscore is 4. GCS verbal subscore is 5. GCS motor subscore is 6.     Comments: Fluent speech, no facial droop. Speech is clear and goal oriented, follows commands CN III-XII intact, no facial droop Normal strength in upper and lower extremities bilaterally including dorsiflexion and plantar flexion, strong and equal grip strength Sensation normal to light and sharp touch Moves extremities without ataxia, coordination intact    Psychiatric:  Mood and Affect: Mood is anxious.        Behavior: Behavior normal.       ED Results / Procedures / Treatments   Labs (all labs ordered are listed, but only abnormal results are displayed) Labs Reviewed  BASIC METABOLIC PANEL - Abnormal; Notable for the following components:      Result Value   Potassium 3.2 (*)    CO2 18 (*)    Creatinine, Ser 1.18 (*)    GFR calc non Af Amer 57 (*)    All other components within normal limits  CBC - Abnormal; Notable for the following components:   Platelets 401 (*)    All other components within normal limits  MAGNESIUM  I-STAT BETA HCG BLOOD, ED ([facility], [facility], [facility] ONLY)  TROPONIN I (HIGH SENSITIVITY)  TROPONIN I (HIGH SENSITIVITY)    EKG EKG Interpretation  Date/Time:  Sunday July 08 2019 17:16:00 EST Ventricular Rate:  95 PR Interval:  126 QRS Duration: 66 QT Interval:  348 QTC Calculation: 437 R Axis:   84 Text Interpretation: Normal sinus rhythm ST & T wave abnormality, consider inferior ischemia ST & T wave abnormality, consider anterolateral ischemia Abnormal ECG no acute changes when compared to prior ecg Confirmed by Madalyn Rob [phone]) on 07/08/2019 8:50:35 PM   EKG Interpretation  Date/Time:  Sunday July 08 2019 22:05:47 EST Ventricular Rate:  72 PR Interval:  * QRS Duration: 66 QT Interval:  464 QTC Calculation: 508 R  Axis:   72 Text Interpretation: Sinus rhythm. Low voltage, precordial leads. Borderline T abnormalities, anterior leads, borderline prolonged QT interval. No STEMI         Radiology DG Chest 2 View  Result Date: 07/08/2019 CLINICAL DATA:  Chest pain and shortness of breath EXAM: CHEST - 2 VIEW COMPARISON:  July 02, 2019 FINDINGS: The heart size and mediastinal contours are within normal limits. Both lungs are clear. The visualized skeletal structures are unremarkable. IMPRESSION: No active cardiopulmonary disease. Electronically Signed   By: Dorise Bullion III M.D   On: 07/08/2019 17:47    Procedures Procedures (including critical care time)  Medications Ordered in ED Medications  sodium chloride 0.9 % bolus 1,000 mL (1,000 mLs Intravenous New Bag/Given 07/08/19 2057)  potassium chloride SA (KLOR-CON) CR tablet 20 mEq (20 mEq Oral Given 07/08/19 2057)  LORazepam (ATIVAN) tablet 1 mg (1 mg Oral Given 07/08/19 2057)    ED Course  I have reviewed the triage vital signs and the nursing notes.  Pertinent labs & imaging results that were available during my care of the patient were reviewed by me and considered in my medical decision making (see chart for details).    MDM Rules/Calculators/A&P                     Patient seen and examined. She is anxious, otherwise well appearing, in no acute distress. Lungs are clear. Neuro examine seemingly normal. When testing CN 5, she answers sensation is normal on both sides. After finishing my exam, she says her face is tingling and feels different. She also has tingling in bilateral hands. She has no associated headache or focal weakness, likely anxiety related. Ativan given and on reassessment she states tingling sensation has resolved.  CBC without leukocytosis, no anemia.  BMP with mild hypokalemia 3.2, repleted with PO. Creatinine elevated compared to labs x 6 days ago. She had recent AKI, IVF given. Magnesium within normal range. Patient  reports still have periods, pregnancy  test negative. Delta troponin undetectable. I viewed pt's chest xray and it does not suggest acute infectious processes  Case discussed with on call cardiologist Dr. Alveta Heimlich who reviewed patient's EKG today and from previous admission. They are similar with t wave inversions in lateral leads. Given she has had 2 undetectable troponins, 4 hours of chest pain, ACS unlikely cause of symptoms. Cardiologist recommends outpatient follow up. Repeat EKG after potassium repletion with borderline T abnormalities in anterior leads, no ST abnormalities. Discussed findings with patient. She is asymptomatic at time of discharge. Will discharge with course of Po potassium, pt aware she needs to have labs including potassium and creatinine rechecked after finishing kdur. Encouraged to stay well hydrated   The patient appears reasonably screened and/or stabilized for discharge and I doubt any other medical condition or other [facility] requiring further screening, evaluation, or treatment in the ED at this time prior to discharge. The patient is safe for discharge with strict return precautions discussed. Recommend pcp follow up. Ambulatory referral sent to cardiology. Findings and plan of care discussed with supervising physician Dr. Roslynn Amble.    Portions of this note were generated with [employer]. Dictation errors may occur despite best attempts at proofreading.  Final Clinical Impression(s) / ED Diagnoses Final diagnoses:  Atypical chest pain    Rx / DC Orders ED Discharge Orders         Ordered    Ambulatory referral to Cardiology    Comments: Abnormal EKG   07/08/19 2304    potassium chloride SA (KLOR-CON) 20 MEQ tablet  Daily     07/08/19 2305           Cherre Robins, PA-C 07/09/19 0255    Lucrezia Starch, MD 07/11/19 1510

## 2019-07-08 NOTE — ED Triage Notes (Signed)
Patient states while sitting at home watching tv her chest started to tighten up and having tingling in her face. Patient states she was just admitting for he same c/o last week. Symptoms had resolved during admission but restarted while at home today.

## 2019-07-08 NOTE — ED Notes (Signed)
2 RN attempt IV start. IV team consulted.

## 2019-07-12 ENCOUNTER — Other Ambulatory Visit: Payer: Self-pay

## 2019-07-12 ENCOUNTER — Ambulatory Visit (HOSPITAL_COMMUNITY)
Admission: EM | Admit: 2019-07-12 | Discharge: 2019-07-12 | Disposition: A | Discharge status: Home / Self Care | Payer: Medicare Other | Attending: Physician Assistant | Admitting: Physician Assistant

## 2019-07-12 ENCOUNTER — Encounter (HOSPITAL_COMMUNITY): Payer: Self-pay | Admitting: Emergency Medicine

## 2019-07-12 DIAGNOSIS — R059 Cough, unspecified: Secondary | ICD-10-CM

## 2019-07-12 DIAGNOSIS — R05 Cough: Secondary | ICD-10-CM

## 2019-07-12 MED ORDER — BENZONATATE 100 MG PO CAPS: 100.0000 mg | ORAL_CAPSULE | Freq: Three times a day (TID) | ORAL | 0 refills | Status: AC | PRN

## 2019-07-12 NOTE — Discharge Instructions (Signed)
Take the tessalon as needed every 8 hours for cough  Go directly to the Emergency Department or call 911 if you have severe chest pain, shortness of breath, severe diarrhea or feel as though you might pass out.   Access your MyChart to review your test results  Please follow up with your Psychiatrist and Primary care provider to best manage your medications

## 2019-07-12 NOTE — ED Triage Notes (Addendum)
Pt here for continued cough; pt sts seen at ED last week for same; pt recently stopped taking psych meds abruptly; pt has scattered thoughts; pt sts she has abnormal labs and PNA on her xray

## 2019-07-12 NOTE — ED Provider Notes (Signed)
[place]    CSN: [id number] Arrival date & time: 07/12/19  1334      History   Chief Complaint Chief Complaint  Patient presents with  . Cough    HPI Kimberly Browning is a 44 y.o. female.   Patient reports to urgent care today for cough. She reports this cough has been present since she was discharged from the Hospital on 07/02/2019. The cough is primarily at night. It is non-productive. She denies shortness of breath. She denies fever, chills, congestion, headache. She was in the hospital on 07/02/2019 lactic acidosis. The cause of her symptoms at that time were never fully understood, she had negative CT scan with atelectasis at right base and on lingula. She had no fever at that time and no other infectious etiology could be found. Had negative COVID testing at that time. She was discharged stable, however returned to emergency department on 1/31 and diagnosed with atypical chest pain. She had negative Xray at that time.   She is here for continued cough that she states adamently has been present since her hospital stay on 1/25 and "I have pneumonia". She refuses COVID testing as she believes there is no way she has this because she was tested on 1/25 and as not been around anyone since.  She reports a recent appointment with her psychiatrist and is pending restart on medications and a phone call with that provider today. She states her mood is good.      Past Medical History:  Diagnosis Date  . Bipolar disorder ([place])   . Depression   . Dry skin    hands  . Feeling of incomplete bladder emptying   . GERD (gastroesophageal reflux disease)   . Hypercholesteremia   . Nocturia   . Urge urinary incontinence   . Vitamin D deficiency   . Wears contact lenses     Patient Active Problem List   Diagnosis Date Noted  . SIRS (systemic inflammatory response syndrome) ([place]) 07/02/2019  . AKI (acute kidney injury) ([place]) 07/02/2019  . Depression with anxiety   . TOBACCO  USER 06/09/2009  . FETAL ALCOHOL SYNDROME 01/23/2009  . MEMORY LOSS 01/23/2009  . MOLAR PREGNANCY 12/12/2008  . INSOMNIA 12/11/2008  . 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 BACK PAIN, LUMBAR 10/10/2007  . OBESITY NOS 02/22/2007  . HYPERLIPIDEMIA 01/03/2007  . 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 BIPOLAR DISORDER 08/04/2006  . SEBORRHEIC DERMATITIS, NOS 08/04/2006    Past Surgical History:  Procedure Laterality Date  . BREAST REDUCTION SURGERY  05/10/2011   Procedure: MAMMARY REDUCTION BILATERAL (BREAST);  Surgeon: Hermelinda Dellen;  Location: [facility];  Service: Plastics;  Laterality: Bilateral;  bilateral breast reduction  . DILATION AND EVACUATION  12/26/2008  . INTERSTIM IMPLANT PLACEMENT N/A 07/09/2014   Procedure: Barrie Lyme IMPLANT FIRST STAGE;  Surgeon: Reece Packer, MD;  Location: [facility];  Service: Urology;  Laterality: N/A;  . INTERSTIM IMPLANT PLACEMENT N/A 07/09/2014   Procedure: Barrie Lyme IMPLANT SECOND STAGE;  Surgeon: Reece Packer, MD;  Location: [facility];  Service: Urology;  Laterality: N/A;  . INTRAUTERINE DEVICE (IUD) INSERTION  sept 2011   mirena  . WISDOM TOOTH EXTRACTION  as a teenager    OB History   No obstetric history on file.      Home Medications    Prior to Admission medications   Medication Sig Start Date End Date Taking? Authorizing Provider  benzonatate (TESSALON) 100 MG  capsule Take 1 capsule (100 mg total) by mouth 3 (three) times daily as needed for up to 7 days for cough. 07/12/19 07/19/19  Ura Hausen, Marguerita Beards, PA-C  phentermine 37.5 MG capsule Take 37.5 mg by mouth daily.    [provider]  potassium chloride SA (KLOR-CON) 20 MEQ tablet Take 1 tablet (20 mEq total) by mouth daily for 5 days. 07/08/19 07/13/19  Albrizze, Kaitlyn E, PA-C  RABEprazole (ACIPHEX) 20 MG tablet Take 20 mg by mouth daily. 05/13/19   [provider]  temazepam (RESTORIL) 15 MG capsule Take 45 mg by  mouth at bedtime. 06/12/19   [provider]    Family History Family History  Adopted: Yes    Social History Social History   Tobacco Use  . Smoking status: Current Every Day Smoker    Packs/day: 0.25    Years: 19.00    Pack years: 4.75    Types: Cigarettes  . Smokeless tobacco: Never Used  . Tobacco comment:  1 pp3d  Substance Use Topics  . Alcohol use: No  . Drug use: No     Allergies   Geodon [ziprasidone hydrochloride], Hibiclens [chlorhexidine], Sulfamethoxazole-trimethoprim, Doxycycline, Gabapentin, Other, Penicillins, Promethazine, and Depakote [divalproex sodium]   Review of Systems Review of Systems  Constitutional: Negative for chills, fatigue and fever.  HENT: Negative for congestion, ear pain, sinus pressure, sinus pain and sore throat.   Eyes: Negative for pain and visual disturbance.  Respiratory: Positive for cough. Negative for shortness of breath.   Cardiovascular: Negative for chest pain and palpitations.  Gastrointestinal: Negative for abdominal pain, diarrhea, nausea and vomiting.  Genitourinary: Negative for dysuria and hematuria.  Musculoskeletal: Negative for arthralgias, back pain and myalgias.  Skin: Negative for color change and rash.  Neurological: Negative for seizures, syncope and headaches.  All other systems reviewed and are negative.    Physical Exam Triage Vital Signs ED Triage Vitals [07/12/19 1413]  Enc Vitals Group     BP 124/69     Pulse Rate 71     Resp 18     Temp 98 F (36.7 C)     Temp Source Oral     SpO2 100 %     Weight      Height      Head Circumference      Peak Flow      Pain Score 0     Pain Loc      Pain Edu?      Excl. in [place]?    No data found.  Updated Vital Signs BP 124/69 (BP Location: Right Arm)   Pulse 71   Temp 98 F (36.7 C) (Oral)   Resp 18   LMP 07/06/2019   SpO2 100%   Visual Acuity Right Eye Distance:   Left Eye Distance:   Bilateral Distance:    Right Eye Near:    Left Eye Near:    Bilateral Near:     Physical Exam Vitals and nursing note reviewed.  Constitutional:      General: She is not in acute distress.    Appearance: Normal appearance. She is well-developed. She is not ill-appearing.  HENT:     Head: Normocephalic and atraumatic.  Eyes:     General: No scleral icterus.    Conjunctiva/sclera: Conjunctivae normal.     Pupils: Pupils are equal, round, and reactive to light.  Cardiovascular:     Rate and Rhythm: Normal rate and regular rhythm.     Heart  sounds: No murmur. No friction rub. No gallop.   Pulmonary:     Effort: Pulmonary effort is normal. No respiratory distress.     Breath sounds: Normal breath sounds. No wheezing, rhonchi or rales.  Abdominal:     Palpations: Abdomen is soft.     Tenderness: There is no abdominal tenderness.  Musculoskeletal:     Cervical back: Neck supple.     Left lower leg: No edema.  Skin:    General: Skin is warm and dry.     Findings: No rash.  Neurological:     General: No focal deficit present.     Mental Status: She is alert and oriented to person, place, and time.  Psychiatric:     Comments: Some illogical thought process with regard to possible COVID, otherwise ok. Judgement is fair, mood stable, though anxious appearing. No tangential thinking. Figidity      UC Treatments / Results  Labs (all labs ordered are listed, but only abnormal results are displayed) Labs Reviewed - No data to display  EKG   Radiology No results found.  Procedures Procedures (including critical care time)  Medications Ordered in UC Medications - No data to display  Initial Impression / Assessment and Plan / UC Course  I have reviewed the triage vital signs and the nursing notes.  Pertinent labs & imaging results that were available during my care of the patient were reviewed by me and considered in my medical decision making (see chart for details).  Previous ED visits reviewed and labs and  imaging reviewed to complete MDM    #Cough - Refused COVID testing, though explained that despite a negative test on [id number], she has certainly had possible exposures since then.  - Chest xray on 1/31 clear, considering no worsening cough and no fever, no repeat - tessalon given for cough - follow up and ED precautions discussed and given.   Final Clinical Impressions(s) / UC Diagnoses   Final diagnoses:  Cough     Discharge Instructions     Take the tessalon as needed every 8 hours for cough  Go directly to the Emergency Department or call 911 if you have severe chest pain, shortness of breath, severe diarrhea or feel as though you might pass out.   Access your MyChart to review your test results  Please follow up with your Psychiatrist and Primary care provider to best manage your medications     ED Prescriptions    Medication Sig Dispense Auth. Provider   benzonatate (TESSALON) 100 MG capsule Take 1 capsule (100 mg total) by mouth 3 (three) times daily as needed for up to 7 days for cough. 21 capsule Mckinnon Glick, Marguerita Beards, PA-C     PDMP not reviewed this encounter.   Purnell Shoemaker, PA-C 07/12/19 1934

## 2019-07-30 ENCOUNTER — Other Ambulatory Visit: Payer: Self-pay

## 2019-07-30 ENCOUNTER — Telehealth (HOSPITAL_COMMUNITY): Payer: Self-pay | Admitting: Internal Medicine

## 2019-07-30 ENCOUNTER — Encounter: Payer: Self-pay | Admitting: Internal Medicine

## 2019-07-30 ENCOUNTER — Ambulatory Visit (INDEPENDENT_AMBULATORY_CARE_PROVIDER_SITE_OTHER): Payer: Medicare Other | Admitting: Internal Medicine

## 2019-07-30 VITALS — BP 120/66 | HR 68 | Ht 64.0 in | Wt 154.0 lb

## 2019-07-30 DIAGNOSIS — R079 Chest pain, unspecified: Secondary | ICD-10-CM | POA: Diagnosis not present

## 2019-07-30 DIAGNOSIS — R0602 Shortness of breath: Secondary | ICD-10-CM

## 2019-07-30 LAB — LIPID PANEL
Chol/HDL Ratio: 4 ratio (ref 0.0–4.4)
Cholesterol, Total: 199 mg/dL (ref 100–199)
HDL: 50 mg/dL (ref >39)
LDL Chol Calc (NIH): 126 mg/dL — ABNORMAL HIGH (ref 0–99)
Triglycerides: 128 mg/dL (ref 0–149)
VLDL Cholesterol Cal: 23 mg/dL (ref 5–40)

## 2019-07-30 NOTE — Patient Instructions (Signed)
Medication Instructions:  No changes *If you need a refill on your cardiac medications before your next appointment, please call your pharmacy*  Lab Work: Today: lipid panel  If you have labs (blood work) drawn today and your tests are completely normal, you will receive your results only by: Marland Kitchen MyChart Message (if you have MyChart) OR . A paper copy in the mail If you have any lab test that is abnormal or we need to change your treatment, we will call you to review the results.  Testing/Procedures: Your physician has requested that you have an echocardiogram. Echocardiography is a painless test that uses sound waves to create images of your heart. It provides your doctor with information about the size and shape of your heart and how well your heart's chambers and valves are working. This procedure takes approximately one hour. There are no restrictions for this procedure.  Follow-Up: Follow up with your physician will depend on test results. Other Instructions

## 2019-07-30 NOTE — Progress Notes (Signed)
Cardiology Office Note   Date:  07/31/2019   ID:  JAID DEMEDEIROS, DOB 1975-06-23, MRN [id number]  PCP:  Sandi Mariscal, MD  Cardiologist:   Dorris Carnes, MD   Pt referred for evaluation of SOB and chest tightness   History of Present Illness: Kimberly Browning is a 44 y.o. female with a history of anxiety/depression.  She was seen in ED on 1/25 with SOB, dizziness, abdominal discomfort and malaise. Tingling sensation in body   IN ED she was tachycardic   HR 140s   + lactic acidosis   CT was neg for PE    Treated for SIRS   ? serotonn syndrome   SHe was seen on 1/31 in ED for complaints of CP   Pain occurred while watching TV   5/10 in severity   Assoc with tingling in face, hadnds.   Cardiolgy called   Unchanged T wave inversion noted   Trop negative  Asymptomatic at d/c  The pt was seen again in ED on 07/12/19 for cough  Primarly at night     Talking to the pt she has not had further spells of chest discomfort like what took her to ED   Does have SOB and chest tightness  Tightness not associated with activity       Current Meds  Medication Sig  . LORazepam (ATIVAN) 1 MG tablet Take 1 mg by mouth 2 (two) times daily.  . Melatonin 3 MG TABS Take by mouth.  . Multiple Vitamin (MULTIVITAMIN) tablet Take 1 tablet by mouth daily.  . phentermine 37.5 MG capsule Take 37.5 mg by mouth daily.  Marland Kitchen REXULTI 1 MG TABS tablet Take 1 mg by mouth daily.  . temazepam (RESTORIL) 15 MG capsule Take 45 mg by mouth at bedtime.     Allergies:   Geodon [ziprasidone hydrochloride], Hibiclens [chlorhexidine], Sulfamethoxazole-trimethoprim, Doxycycline, Gabapentin, Other, Penicillins, Promethazine, and Depakote [divalproex sodium]   Past Medical History:  Diagnosis Date  . Bipolar disorder ([place])   . Depression   . Dry skin    hands  . Feeling of incomplete bladder emptying   . GERD (gastroesophageal reflux disease)   . Hypercholesteremia   . Nocturia   . Urge urinary incontinence   . Vitamin D deficiency    . Wears contact lenses     Past Surgical History:  Procedure Laterality Date  . BREAST REDUCTION SURGERY  05/10/2011   Procedure: MAMMARY REDUCTION BILATERAL (BREAST);  Surgeon: Hermelinda Dellen;  Location: [facility];  Service: Plastics;  Laterality: Bilateral;  bilateral breast reduction  . DILATION AND EVACUATION  12/26/2008  . INTERSTIM IMPLANT PLACEMENT N/A 07/09/2014   Procedure: Barrie Lyme IMPLANT FIRST STAGE;  Surgeon: Reece Packer, MD;  Location: [facility];  Service: Urology;  Laterality: N/A;  . INTERSTIM IMPLANT PLACEMENT N/A 07/09/2014   Procedure: Barrie Lyme IMPLANT SECOND STAGE;  Surgeon: Reece Packer, MD;  Location: [facility];  Service: Urology;  Laterality: N/A;  . INTRAUTERINE DEVICE (IUD) INSERTION  sept 2011   mirena  . WISDOM TOOTH EXTRACTION  as a teenager     Social History:  The patient Hx of smoking since teens   Still smokes several cigs per day  Family History:  The patient's family history is not on file. She was adopted.    ROS:  Please see the history of present illness. All other systems are reviewed and  Negative to the above problem except as noted.  PHYSICAL EXAM: VS:  BP 120/66   Pulse 68   Ht 5\' 4"  (1.626 m)   Wt 154 lb (69.9 kg)   LMP 07/06/2019   SpO2 98%   BMI 26.43 kg/m   GEN: Well nourished, well developed, in no acute distress  HEENT: normal  Neck: no JVD, carotid bruits Cardiac: RRR; no murmurs, rubs, or gallops,no edema  Respiratory:  clear to auscultation bilaterally, normal work of breathing GI: soft, nontender, nondistended, + BS  No hepatomegaly  MS: no deformity Moving all extremities   Skin: warm and dry, no rash Neuro:  Strength and sensation are intact Psych: euthymic mood, full affect   EKG:  EKG is not ordered today.  ON 07/02/19:  ST  101 bpm    ST depression in the inferior and lateral leads   EKG on 1//31/21:  No change  REpeat 07/08/19:  SR 72 bpm    ProlongedQT interval     Lipid Panel    Component Value Date/Time   CHOL 199 07/30/2019 1114   TRIG 128 07/30/2019 1114   HDL 50 07/30/2019 1114   CHOLHDL 4.0 07/30/2019 1114   CHOLHDL 5.0 Ratio 01/03/2007 2053   VLDL 37 01/03/2007 2053   LDLCALC 126 (H) 07/30/2019 1114      Wt Readings from Last 3 Encounters:  07/30/19 154 lb (69.9 kg)  07/03/19 157 lb 10.1 oz (71.5 kg)  03/06/18 185 lb (83.9 kg)      ASSESSMENT AND PLAN: 1   CHest tightness/chest pain.   Atypical   I am not convinced that  it represents angina  SHe did though have dynamic changes on EKG from admit   Will set up for echo to eval LV size, thickness and function   2   LIpids  Check lipid panel  3  Tob  COunselled on cessation      Current medicines are reviewed at length with the patient today.  The patient does not have concerns regarding medicines.  Signed, Dorris Carnes, MD  07/31/2019 10:54 PM    [address], [place], [place]  [address] Phone: [phone]; Fax: [phone]

## 2019-07-30 NOTE — Telephone Encounter (Signed)
Called patient and LVM to call office.  She needs to schedule an Echocardiogram. LBW 07/30/2019 11:08am

## 2019-07-30 NOTE — Progress Notes (Signed)
Cardiology Office Note   Date:  07/30/2019   ID:  Kimberly Browning, DOB 12-07-1975, MRN [id number]  PCP:  Sandi Mariscal, MD  Cardiologist:   Dorris Carnes, MD       History of Present Illness: Kimberly Browning is a 44 y.o. female with a history of       Current Meds  Medication Sig  . LORazepam (ATIVAN) 1 MG tablet Take 1 mg by mouth 2 (two) times daily.  . Melatonin 3 MG TABS Take by mouth.  . Multiple Vitamin (MULTIVITAMIN) tablet Take 1 tablet by mouth daily.  . phentermine 37.5 MG capsule Take 37.5 mg by mouth daily.  Marland Kitchen REXULTI 1 MG TABS tablet Take 1 mg by mouth daily.  . temazepam (RESTORIL) 15 MG capsule Take 45 mg by mouth at bedtime.     Allergies:   Geodon [ziprasidone hydrochloride], Hibiclens [chlorhexidine], Sulfamethoxazole-trimethoprim, Doxycycline, Gabapentin, Other, Penicillins, Promethazine, and Depakote [divalproex sodium]   Past Medical History:  Diagnosis Date  . Bipolar disorder ([place])   . Depression   . Dry skin    hands  . Feeling of incomplete bladder emptying   . GERD (gastroesophageal reflux disease)   . Hypercholesteremia   . Nocturia   . Urge urinary incontinence   . Vitamin D deficiency   . Wears contact lenses     Past Surgical History:  Procedure Laterality Date  . BREAST REDUCTION SURGERY  05/10/2011   Procedure: MAMMARY REDUCTION BILATERAL (BREAST);  Surgeon: Hermelinda Dellen;  Location: [place];  Service: Plastics;  Laterality: Bilateral;  bilateral breast reduction  . DILATION AND EVACUATION  12/26/2008  . INTERSTIM IMPLANT PLACEMENT N/A 07/09/2014   Procedure: Barrie Lyme IMPLANT FIRST STAGE;  Surgeon: Reece Packer, MD;  Location: [facility];  Service: Urology;  Laterality: N/A;  . INTERSTIM IMPLANT PLACEMENT N/A 07/09/2014   Procedure: Barrie Lyme IMPLANT SECOND STAGE;  Surgeon: Reece Packer, MD;  Location: [facility];  Service: Urology;  Laterality: N/A;  . INTRAUTERINE DEVICE  (IUD) INSERTION  sept 2011   mirena  . WISDOM TOOTH EXTRACTION  as a teenager     Social History:  The patient  reports that she has been smoking cigarettes. She has a 4.75 pack-year smoking history. She has never used smokeless tobacco. She reports that she does not drink alcohol or use drugs.   Family History:  The patient's family history is not on file. She was adopted.    ROS:  Please see the history of present illness. All other systems are reviewed and  Negative to the above problem except as noted.    PHYSICAL EXAM: VS:  BP 120/66   Pulse 68   Ht 5\' 4"  (1.626 m)   Wt 154 lb (69.9 kg)   LMP 07/06/2019   SpO2 98%   BMI 26.43 kg/m   GEN: Well nourished, well developed, in no acute distress  HEENT: normal  Neck: no JVD, carotid bruits, or masses Cardiac: RRR; no murmurs, rubs, or gallops,no edema  Respiratory:  clear to auscultation bilaterally, normal work of breathing GI: soft, nontender, nondistended, + BS  No hepatomegaly  MS: no deformity Moving all extremities   Skin: warm and dry, no rash Neuro:  Strength and sensation are intact Psych: euthymic mood, full affect   EKG:  EKG is ordered today.   Lipid Panel    Component Value Date/Time   CHOL 228 (H) 01/03/2007 2053   TRIG 183 (H)  01/03/2007 2053   HDL 46 01/03/2007 2053   CHOLHDL 5.0 Ratio 01/03/2007 2053   VLDL 37 01/03/2007 2053   LDLCALC 145 (H) 01/03/2007 2053      Wt Readings from Last 3 Encounters:  07/30/19 154 lb (69.9 kg)  07/03/19 157 lb 10.1 oz (71.5 kg)  03/06/18 185 lb (83.9 kg)      ASSESSMENT AND PLAN:     Current medicines are reviewed at length with the patient today.  The patient does not have concerns regarding medicines.  Signed, Dorris Carnes, MD  07/30/2019 10:55 AM    [address], [place], [place]  [address] Phone: [phone]; Fax: [phone]     Cardiology Office Note   Date:  07/30/2019   ID:  Kimberly Browning, DOB  1975-07-03, MRN [id number]  PCP:  Sandi Mariscal, MD  Cardiologist:   Dorris Carnes, MD       History of Present Illness: Kimberly Browning is a 44 y.o. female with a history of anxiety / depressinog    Was on medication for this and sopped meds "cold [place]"   Had started in Jan     Psychologtist told to take 1 more to elim withdrawal   Ended up in ED with fet, toes, hands neck tingling  She was SOB   Sent home on 27   Went back to ED on 07/08/19 with same symtpoms    Still has SOB   All day   Activity makes itsome worse  Has intermitt tightness  Last spell was last week   Sitting on sofa         Current Meds  Medication Sig  . LORazepam (ATIVAN) 1 MG tablet Take 1 mg by mouth 2 (two) times daily.  . Melatonin 3 MG TABS Take by mouth.  . Multiple Vitamin (MULTIVITAMIN) tablet Take 1 tablet by mouth daily.  . phentermine 37.5 MG capsule Take 37.5 mg by mouth daily.  Marland Kitchen REXULTI 1 MG TABS tablet Take 1 mg by mouth daily.  . temazepam (RESTORIL) 15 MG capsule Take 45 mg by mouth at bedtime.     Allergies:   Geodon [ziprasidone hydrochloride], Hibiclens [chlorhexidine], Sulfamethoxazole-trimethoprim, Doxycycline, Gabapentin, Other, Penicillins, Promethazine, and Depakote [divalproex sodium]   Past Medical History:  Diagnosis Date  . Bipolar disorder ([place])   . Depression   . Dry skin    hands  . Feeling of incomplete bladder emptying   . GERD (gastroesophageal reflux disease)   . Hypercholesteremia   . Nocturia   . Urge urinary incontinence   . Vitamin D deficiency   . Wears contact lenses     Past Surgical History:  Procedure Laterality Date  . BREAST REDUCTION SURGERY  05/10/2011   Procedure: MAMMARY REDUCTION BILATERAL (BREAST);  Surgeon: Hermelinda Dellen;  Location: [facility];  Service: Plastics;  Laterality: Bilateral;  bilateral breast reduction  . DILATION AND EVACUATION  12/26/2008  . INTERSTIM IMPLANT PLACEMENT N/A 07/09/2014   Procedure: Barrie Lyme IMPLANT FIRST  STAGE;  Surgeon: Reece Packer, MD;  Location: [facility];  Service: Urology;  Laterality: N/A;  . INTERSTIM IMPLANT PLACEMENT N/A 07/09/2014   Procedure: Barrie Lyme IMPLANT SECOND STAGE;  Surgeon: Reece Packer, MD;  Location: [facility];  Service: Urology;  Laterality: N/A;  . INTRAUTERINE DEVICE (IUD) INSERTION  sept 2011   mirena  . WISDOM TOOTH EXTRACTION  as a teenager     Social  History:  The patient  reports that she has been smoking cigarettes. She has a 4.75 pack-year smoking history. She has never used smokeless tobacco. She reports that she does not drink alcohol or use drugs.   Family History:  The patient's family history is not on file. She was adopted.    ROS:  Please see the history of present illness. All other systems are reviewed and  Negative to the above problem except as noted.    PHYSICAL EXAM: VS:  BP 120/66   Pulse 68   Ht 5\' 4"  (1.626 m)   Wt 154 lb (69.9 kg)   LMP 07/06/2019   SpO2 98%   BMI 26.43 kg/m   GEN: Well nourished, well developed, in no acute distress  HEENT: normal  Neck: no JVD, carotid bruits, or masses Cardiac: RRR; no murmurs, rubs, or gallops,no edema  Respiratory:  clear to auscultation bilaterally, normal work of breathing GI: soft, nontender, nondistended, + BS  No hepatomegaly  MS: no deformity Moving all extremities   Skin: warm and dry, no rash Neuro:  Strength and sensation are intact Psych: euthymic mood, full affect   EKG:  EKG is not ordered today.   Lipid Panel    Component Value Date/Time   CHOL 228 (H) 01/03/2007 2053   TRIG 183 (H) 01/03/2007 2053   HDL 46 01/03/2007 2053   CHOLHDL 5.0 Ratio 01/03/2007 2053   VLDL 37 01/03/2007 2053   LDLCALC 145 (H) 01/03/2007 2053      Wt Readings from Last 3 Encounters:  07/30/19 154 lb (69.9 kg)  07/03/19 157 lb 10.1 oz (71.5 kg)  03/06/18 185 lb (83.9 kg)      ASSESSMENT AND PLAN:     Current medicines are reviewed at  length with the patient today.  The patient does not have concerns regarding medicines.  Signed, Dorris Carnes, MD  07/30/2019 10:55 AM    [address], [place], [place]  [address] Phone: [phone]; Fax: [phone]

## 2019-08-01 ENCOUNTER — Telehealth: Payer: Self-pay

## 2019-08-01 NOTE — Telephone Encounter (Signed)
The patient has been notified of the result and verbalized understanding.  All questions (if any) were answered. Wilma Flavin, RN 08/01/2019 8:48 AM

## 2019-08-01 NOTE — Telephone Encounter (Signed)
-----   Message from Dorris Carnes V, MD sent at 07/31/2019 11:06 PM EST ----- Lipids are not too bad HDL is good at 50 (good cholesterol) LDL:  126   Watch saturated fats  cut back on fried foods, dressings

## 2019-08-14 ENCOUNTER — Ambulatory Visit (HOSPITAL_COMMUNITY): Payer: Medicare Other | Attending: Cardiology

## 2019-08-14 ENCOUNTER — Other Ambulatory Visit: Payer: Self-pay

## 2019-08-14 DIAGNOSIS — R079 Chest pain, unspecified: Secondary | ICD-10-CM | POA: Insufficient documentation

## 2019-08-14 DIAGNOSIS — R0602 Shortness of breath: Secondary | ICD-10-CM | POA: Diagnosis present

## 2019-08-20 ENCOUNTER — Telehealth: Payer: Self-pay | Admitting: Internal Medicine

## 2019-08-20 NOTE — Telephone Encounter (Signed)
New message:    Patient calling concering her results.please call patient.

## 2019-08-20 NOTE — Telephone Encounter (Signed)
Fay Records, MD  Rodman Key, RN  Echo shows LVEF is normal Normal valve functoin  Lipids:  LDL 126  Watch saturated fats  Follow up as needed  If chest symptoms recur call back

## 2019-08-20 NOTE — Telephone Encounter (Signed)
Patient has been informed of echo results and recommendations re: lipids and follow up.  She verbalizes understanding.  She states she does not eat, hardly at all and when she does it is fruits.  Does not eat saturated fats.   Discussed that genes may play a part.  She is going to further discuss with at her upcoming family medicine appointment.

## 2019-08-21 NOTE — Telephone Encounter (Signed)
Patient states she is requesting to have echocardiogram results from echocardiogram completed on 08/14/19 faxed over to Dr. Parke Simmers [facility].

## 2019-08-23 NOTE — Progress Notes (Signed)
Subjective:    Patient ID: Kimberly Browning, female    DOB: 1975-08-07, 44 y.o.   MRN: [id number]   CC: New Patient She is here today to establish care from her prior PCP Dr. Nancy Fetter due to distance and referral difficulties. She notes that she often has to travel to [facility] for imaging and referrals and this is difficult for her.   HPI: PMHx: Past Medical History:  Diagnosis Date  . Bipolar disorder ([place])   . Depression   . Dry skin    hands  . Feeling of incomplete bladder emptying   . GERD (gastroesophageal reflux disease)   . Hypercholesteremia   . Nocturia   . Urge urinary incontinence   . Vitamin D deficiency   . Wears contact lenses    Follows with psychiatrist Dr. Noemi Chapel who manages her medications.   Surgical Hx: Past Surgical History:  Procedure Laterality Date  . BREAST REDUCTION SURGERY  05/10/2011   Procedure: MAMMARY REDUCTION BILATERAL (BREAST);  Surgeon: Hermelinda Dellen;  Location: [place];  Service: Plastics;  Laterality: Bilateral;  bilateral breast reduction  . DILATION AND EVACUATION  12/26/2008  . INTERSTIM IMPLANT PLACEMENT N/A 07/09/2014   Procedure: Barrie Lyme IMPLANT FIRST STAGE;  Surgeon: Reece Packer, MD;  Location: [facility];  Service: Urology;  Laterality: N/A;  . INTERSTIM IMPLANT PLACEMENT N/A 07/09/2014   Procedure: Barrie Lyme IMPLANT SECOND STAGE;  Surgeon: Reece Packer, MD;  Location: [place];  Service: Urology;  Laterality: N/A;  . INTRAUTERINE DEVICE (IUD) INSERTION  sept 2011   mirena  . 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 She has a 4.75 pack-year smoking history. She has never used smokeless tobacco. She reports that she does not drink alcohol or use drugs.  Medications: Rexulti (Brexpiprazole) 1mg  QD Rabeprazole 20mg  QD Phentermine 37.5mg  QD Temazepam 15mg  - 3 tablets Qhs Dayvigo (lemborexant) 10mg  QD Lorazepam 1mg  QD Melatonin  ROS: Patient reports no  vision/ hearing changes,anorexia, weight change, fever ,adenopathy, persistant / recurrent hoarseness, swallowing issues, chest pain, edema,persistant / recurrent cough, hemoptysis, dyspnea(rest, exertional, paroxysmal nocturnal), gastrointestinal  bleeding (melena, rectal bleeding), abdominal pain, excessive heart burn, GU symptoms(dysuria, hematuria, pyuria, voiding/incontinence  Issues) syncope, focal weakness, severe memory loss, concerning skin lesions, depression, anxiety, abnormal bruising/bleeding, major joint swelling, breast masses or abnormal vaginal bleeding.    Preventative Screening Colonoscopy: Reports having a colonoscopy in the past, unsure when. Had one prior to recommended screening due to having "abdominal pain" and unexplained weight loss. She notes the results were normal. Mammogram: 03/2018: negative  Pap test: 12/2007 - ASCUS-HPV high risk NEGATIVE. Notes she had one at [facility] in 2020.  DEXA: None HIV screen: 06/2019 - non-reactive  Tetanus vaccine: 03/2011 Pneumonia vaccine: N/A Shingles vaccine: N/A Heart stress test: None Echocardiogram: 08/2019:  LVEF 60-65%, normal function, normal RV function, normal valve function Xrays: CXR 06/2019: normal CT/MRI: CTA 06/2019: negative for PE, areas of atelectasis in lingula and  RLL; MRI brain 04/2018: negative   Health Maintenance Due  Topic Date Due  . PAP SMEAR-Modifier  12/19/2010  . INFLUENZA VACCINE  01/06/2019     Smoking status reviewed  Review of Systems   Objective:  BP 118/75   Pulse 97   Wt 156 lb 9.6 oz (71 kg)   SpO2 97%   BMI 26.88 kg/m  Vitals and nursing note reviewed  General: Pleasant middle-aged female, well nourished, in no acute distress, and comfortably in exam chair, very fidgety and constantly moving HEENT: normocephalic, TM's visualized bilaterally, no scleral icterus or conjunctival pallor, no nasal discharge, moist mucous membranes, good dentition however no teeth on bottom jaw, no erythema or discharge noted in posterior oropharynx Neck: supple, non-tender, without lymphadenopathy, no thyromegaly, no carotid bruits Cardiac: RRR, clear S1 and S2, no murmurs, rubs, or gallops, 2+ radial pulses bilaterally Respiratory: clear to auscultation bilaterally, no increased work of breathing Abdomen: soft, nontender, nondistended, no masses or organomegaly. Bowel sounds present Extremities: no edema or cyanosis. Warm, well perfused. Skin: warm and dry, no rashes noted Neuro: alert and oriented, no focal deficits Psych:  Conversant and pleasant.  Alert, communicative  and cooperative.  Speech is pressured and she is very fidgety constantly moving her feet. No apparent delusions, illusions, hallucinations.  Does not appear depressed or manic.  No delusions of grandeur.    Assessment & Plan:   Overweight (BMI 25.0-29.9) Patient notes she has been treated for weight loss with phentermine 37.5 mg by her last PCP.  She notes improvement in her weight when using this.  She has a goal of losing 10 pounds.  She notes she has not been taking it for a little while.  Weight up 2 pounds since end of February and stable since January.  Discussed limitations of weight loss medications, particularly phentermine in regards to  its limits in weight loss  of only ~5lbs and short-term success with rebound weight gain often experienced after discontinuation. This can already be seen with her weight gain after lapse in use. Discussed importance of diet and exercise as well as referral to weight management clinic here in [place].  She was very excited about this and opted for the referral.  TOBACCO USER Patient working on cessation.  She notes she has cut down from 6 to 8 cigarettes/day to 4 cigarettes.  She thinks the result he is helping her and her cessation as it helps keep her energized and busy.  Congratulated patient on success.   Depression with anxiety Chronic.  Followed by psychiatrist Dr. Noemi Chapel who manages her mental health medications.  Currently on Rexulti 1 mg daily, temazepam 15 mg nightly, lorazepam 1 mg daily, and lemborexant 10mg  QD for insomnia. She notes being well controlled with these current medications. PHQ-2 of 3 today. She notes she feels more down/depressed because she is waiting on her stimulus check.  Denies any SI/HI. -Continue current medications -Follow-up with psychiatrist as scheduled  Hyperlipidemia Last lipid panel obtained on Feb 2021. Chol 199, Trig 128, HDL 50, LDL 126.  The 10-year ASCVD risk score Mikey Bussing DC Brooke Bonito., et al., 2013) is: 2.6%   Values used to calculate the score:     Age: 41 years     Sex: Female     Is Non-Hispanic African American: No  Diabetic: No     Tobacco smoker: Yes     Systolic Blood Pressure: 123456 mmHg     Is BP treated: No     HDL Cholesterol: 50 mg/dL     Total Cholesterol: 199 mg/dL  Statin therapy not indicated at this time. Did recommend smoking cessation to improve CV health.  BIPOLAR DISORDER Followed regularly by Dr. Noemi Chapel. See plan above. Some concern for extraparamydal side effects from her atypical antipsychotic that she notes started around the time she started Rexulti. She had difficulty sitting still throughout exam and was very fidgety, constantly moving  feet and legs. Recommended patient schedule follow-up with psychiatrist to further discuss.  She voiced understanding and agreement with plan  History of ASCUS on Pap-smear: Last pap-smear in 2009 notable for ASCUS, negative for high risk HPV. Notes she had a Pap-smear in 2020. Results not in Epic. Will request prior medical records to evaluate.  Elevated Creatinine  History of Hypokalemia: Last BMP in January 2021 reveals hypokalemia of 3.2 and an elevated creatinine of 1.18, baseline appears to be around 0.9.  She was in the emergency department at the time of this lab draw.  Will obtain repeat CMP today to evaluate for resolution.  History of Vitamin D Deficiency: Will obtain repeat Vitamin D level  RTC in 6-12 months for annual physical, sooner if needed.  Danna Hefty, [place], PGY2 08/24/2019 11:37 AM

## 2019-08-24 ENCOUNTER — Other Ambulatory Visit: Payer: Self-pay

## 2019-08-24 ENCOUNTER — Ambulatory Visit (INDEPENDENT_AMBULATORY_CARE_PROVIDER_SITE_OTHER): Payer: Medicare Other | Admitting: Family Medicine

## 2019-08-24 ENCOUNTER — Encounter: Payer: Self-pay | Admitting: Family Medicine

## 2019-08-24 VITALS — BP 118/75 | HR 97 | Wt 156.6 lb

## 2019-08-24 DIAGNOSIS — E66811 Obesity, class 1: Secondary | ICD-10-CM | POA: Insufficient documentation

## 2019-08-24 DIAGNOSIS — F17200 Nicotine dependence, unspecified, uncomplicated: Secondary | ICD-10-CM | POA: Diagnosis not present

## 2019-08-24 DIAGNOSIS — F418 Other specified anxiety disorders: Secondary | ICD-10-CM | POA: Diagnosis not present

## 2019-08-24 DIAGNOSIS — Z7689 Persons encountering health services in other specified circumstances: Secondary | ICD-10-CM | POA: Diagnosis not present

## 2019-08-24 DIAGNOSIS — E669 Obesity, unspecified: Secondary | ICD-10-CM | POA: Insufficient documentation

## 2019-08-24 DIAGNOSIS — E782 Mixed hyperlipidemia: Secondary | ICD-10-CM

## 2019-08-24 DIAGNOSIS — Z8639 Personal history of other endocrine, nutritional and metabolic disease: Secondary | ICD-10-CM | POA: Diagnosis not present

## 2019-08-24 DIAGNOSIS — E663 Overweight: Secondary | ICD-10-CM

## 2019-08-24 DIAGNOSIS — R7989 Other specified abnormal findings of blood chemistry: Secondary | ICD-10-CM | POA: Diagnosis not present

## 2019-08-24 DIAGNOSIS — E559 Vitamin D deficiency, unspecified: Secondary | ICD-10-CM | POA: Diagnosis not present

## 2019-08-24 HISTORY — DX: Obesity, class 1: E66.811

## 2019-08-24 NOTE — Assessment & Plan Note (Signed)
Patient notes she has been treated for weight loss with phentermine 37.5 mg by her last PCP.  She notes improvement in her weight when using this.  She has a goal of losing 10 pounds.  She notes she has not been taking it for a little while.  Weight up 2 pounds since end of February and stable since January.  Discussed limitations of weight loss medications, particularly phentermine in regards to  its limits in weight loss of only ~5lbs and short-term success with rebound weight gain often experienced after discontinuation. This can already be seen with her weight gain after lapse in use. Discussed importance of diet and exercise as well as referral to weight management clinic here in [place].  She was very excited about this and opted for the referral.

## 2019-08-24 NOTE — Patient Instructions (Signed)
Thank you for coming to see me today. It was a pleasure to meet you.  I have obtained repeat labs to check on your kidneys and electrolytes. I will call you with the results.  We have sent in a request for [place] to get your medical records. Once I receive them I will review them.    Please follow-up with me in 6-69months, sooner if needed.   If you have any questions or concerns, please do not hesitate to call the office at [phone].  Take Care,   Dr. Mina Marble, DO Resident Physician [place] [phone]

## 2019-08-24 NOTE — Assessment & Plan Note (Signed)
Patient working on cessation.  She notes she has cut down from 6 to 8 cigarettes/day to 4 cigarettes.  She thinks the result he is helping her and her cessation as it helps keep her energized and busy.  Congratulated patient on success.

## 2019-08-24 NOTE — Assessment & Plan Note (Signed)
Last lipid panel obtained on Feb 2021. Chol 199, Trig 128, HDL 50, LDL 126.  The 10-year ASCVD risk score Mikey Bussing DC Brooke Bonito., et al., 2013) is: 2.6%   Values used to calculate the score:     Age: 44 years     Sex: Female     Is Non-Hispanic African American: No     Diabetic: No     Tobacco smoker: Yes     Systolic Blood Pressure: 123456 mmHg     Is BP treated: No     HDL Cholesterol: 50 mg/dL     Total Cholesterol: 199 mg/dL  Statin therapy not indicated at this time. Did recommend smoking cessation to improve CV health.

## 2019-08-24 NOTE — Assessment & Plan Note (Addendum)
Followed regularly by Dr. Noemi Chapel. See plan above. Some concern for extraparamydal side effects from her atypical antipsychotic that she notes started around the time she started Rexulti. She had difficulty sitting still throughout exam and was very fidgety, constantly moving feet and legs. Recommended patient schedule follow-up with psychiatrist to further discuss.  She voiced understanding and agreement with plan

## 2019-08-24 NOTE — Assessment & Plan Note (Signed)
Chronic.  Followed by psychiatrist Dr. Noemi Chapel who manages her mental health medications.  Currently on Rexulti 1 mg daily, temazepam 15 mg nightly, lorazepam 1 mg daily, and lemborexant 10mg  QD for insomnia. She notes being well controlled with these current medications. PHQ-2 of 3 today. She notes she feels more down/depressed because she is waiting on her stimulus check.  Denies any SI/HI. -Continue current medications -Follow-up with psychiatrist as scheduled

## 2019-08-25 LAB — COMPREHENSIVE METABOLIC PANEL
ALT: 13 IU/L (ref 0–32)
AST: 17 IU/L (ref 0–40)
Albumin/Globulin Ratio: 2 (ref 1.2–2.2)
Albumin: 4.3 g/dL (ref 3.8–4.8)
Alkaline Phosphatase: 113 IU/L (ref 39–117)
BUN/Creatinine Ratio: 12 (ref 9–23)
BUN: 12 mg/dL (ref 6–24)
Bilirubin Total: 0.2 mg/dL (ref 0.0–1.2)
CO2: 21 mmol/L (ref 20–29)
Calcium: 9.8 mg/dL (ref 8.7–10.2)
Chloride: 104 mmol/L (ref 96–106)
Creatinine, Ser: 1.04 mg/dL — ABNORMAL HIGH (ref 0.57–1.00)
GFR calc Af Amer: 76 mL/min/{1.73_m2} (ref >59)
GFR calc non Af Amer: 66 mL/min/{1.73_m2} (ref >59)
Globulin, Total: 2.2 g/dL (ref 1.5–4.5)
Glucose: 74 mg/dL (ref 65–99)
Potassium: 4.1 mmol/L (ref 3.5–5.2)
Sodium: 139 mmol/L (ref 134–144)
Total Protein: 6.5 g/dL (ref 6.0–8.5)

## 2019-08-25 LAB — VITAMIN D 25 HYDROXY (VIT D DEFICIENCY, FRACTURES): Vit D, 25-Hydroxy: 27.1 ng/mL — ABNORMAL LOW (ref 30.0–100.0)

## 2019-08-27 ENCOUNTER — Telehealth: Payer: Self-pay | Admitting: Internal Medicine

## 2019-08-27 NOTE — Telephone Encounter (Signed)
Follow Up:    Pt checking on the status of her Echo results. She need it fax today to [phone] please.

## 2019-08-27 NOTE — Telephone Encounter (Signed)
   Primary Cardiologist: Dr. Harrington Challenger  Per phone note on 08/21/2019, patient called asking recent Echo results to be faxed to Dr. Parke Simmers (Oral Surgeon) office. We received another message today asking [place] to fax this report to [phone]. Echo on 08/14/2019 showed LVEF of  60-65% with normal wall motion, normal diastolic parameters, and mild LVH. No significant valvular disease noted. Will fax full Echo report as requested. However, if any official pre-op evaluation/clearance is needed, we will need an official clearance form from surgeon's office in order for [place] to complete cardiovascular risk assessment.   I will route this recommendation to the requesting party via Epic fax function and remove from pre-op pool.  Please call with questions.  Darreld Mclean, PA-C 08/27/2019, 2:38 PM

## 2019-09-04 ENCOUNTER — Telehealth: Payer: Self-pay

## 2019-09-04 DIAGNOSIS — F418 Other specified anxiety disorders: Secondary | ICD-10-CM

## 2019-09-04 DIAGNOSIS — E663 Overweight: Secondary | ICD-10-CM

## 2019-09-04 DIAGNOSIS — F17200 Nicotine dependence, unspecified, uncomplicated: Secondary | ICD-10-CM

## 2019-09-04 NOTE — Telephone Encounter (Signed)
Patient calls nurse line requesting weight loss medication change. Patient has taken phentermine in the past, however, patient reports insomnia from medication. Informed patient that per provider note it seemed that best management would be achieved through diet and exercise, as medications have short benefits. Patient states that she would like to know alternative medications for management.   Patient also reports that she cannot be accepted into the Weight Management Clinic.   Called clinic and lvm requesting more information.  To PCP  Talbot Grumbling, RN

## 2019-09-05 NOTE — Telephone Encounter (Signed)
Spoke with Dawn from [facility]. Per Dawn, patient is not eligible for services due to BMI. Requirements for clinic is BMI 30 or greater.   Talbot Grumbling, RN

## 2019-09-06 ENCOUNTER — Ambulatory Visit: Payer: Medicare Other | Attending: Internal Medicine

## 2019-09-06 DIAGNOSIS — Z23 Encounter for immunization: Secondary | ICD-10-CM

## 2019-09-06 NOTE — Progress Notes (Signed)
   Covid-19 Vaccination Clinic  Name:  Kimberly Browning    MRN: [id number] DOB: [birth year]  09/06/2019  [name] was observed post Covid-19 immunization for 30 minutes based on pre-vaccination screening without incident. She was provided with Vaccine Information Sheet and instruction to access the V-Safe system.   [name] was instructed to call 911 with any severe reactions post vaccine: Marland Kitchen Difficulty breathing  . Swelling of face and throat  . A fast heartbeat  . A bad rash all over body  . Dizziness and weakness   Immunizations Administered    Name Date Dose VIS Date Route   Pfizer COVID-19 Vaccine 09/06/2019  1:14 PM 0.3 mL 05/18/2019 Intramuscular   Manufacturer: [place]   Lot: [id number]   [place]: [id number]

## 2019-09-10 NOTE — Telephone Encounter (Signed)
Patient returning phone call to front office staff regarding phentermine. Patient would like to know if decreasing the dosage would help with insomnia, rather than quitting entire dosage.   To PCP  Please advise  Talbot Grumbling, RN

## 2019-09-12 NOTE — Telephone Encounter (Signed)
Attempted to call patient back to further discuss. Would prefer to avoid Phentermine as it is not recommended for long term use and tends to lead to rebound weight gain after discontinuation. We can consider Liraglutide 0.6mg  SubQ daily with goal to titrate up weekly to recommended dose of 3mg /day. This medication is much safer to use long term and has been shown to be effective for weight gain when combined with diet and exercise. If this is something she is interested in, we could arrange this. I will await call back before moving forward.

## 2019-09-14 NOTE — Telephone Encounter (Signed)
Pt calls back. She would like Dr. Tarry Kos to call her @ [phone].  She states you can leave a message if she does not answer. Christen Bame, CMA

## 2019-09-15 MED ORDER — BUPROPION HCL ER (SR) 150 MG PO TB12: 150.0000 mg | ORAL_TABLET | Freq: Two times a day (BID) | ORAL | 0 refills | Status: DC

## 2019-09-15 NOTE — Telephone Encounter (Signed)
Spoke to patient in regards to ongoing weight loss.  She has been intolerant to Metformin due to GI upset, pheniramine, and has recently been on phentermine 37.5 mg daily by her last PCP and was seeing weight loss.  She notes insomnia with the phentermine however.  She has recently quit smoking cigarettes and finds herself eating much more.  She is trying to be more active and walk more.  She was referred to the weight management clinic but did not meet the requirements.  Continued to explain that I prefer not to continue phentermine long-term as it would likely lead to rebound weight gain.  Given that she has been unsuccessful with Metformin and liraglutide, discussed other options.  Given that she endorses concurrent depression and smoking cessation believe patient may benefit greatly from bupropion.  Will trial bupropion 150 mg twice daily with plan to follow-up in 1 month to evaluate tolerability.  Follow-up appointment scheduled for 10/23/2019.  Patient voiced understanding and agreement with plan.

## 2019-09-17 NOTE — Telephone Encounter (Signed)
Patient returns call to nurse line and is very anxious regarding new rx for Wellbutrin. Patient states that she already takes medication for depression and she does not know why she is being prescribed additional depression medication. Attempted to answer patient's questions per note from Dr. Tarry Kos, however patient still has concerns about medication. Patient scheduled for earlier appointment with PCP.   Patient requesting phone call from PCP as soon as possible.   To PCP  Talbot Grumbling, RN

## 2019-09-18 NOTE — Telephone Encounter (Signed)
Reached out to patient's psychiatrist Dr. Noemi Chapel to further discuss initiation of bupropion and necessary adjustments to current psychiatric medications initiated.  Will await callback.

## 2019-09-21 NOTE — Telephone Encounter (Signed)
Touched base with Dr. Noemi Chapel. No contraindications to starting Bupropion 150mg  BID with her current medications. LMoVM for patient.

## 2019-09-26 NOTE — Telephone Encounter (Signed)
I s/w Dr. Hoyt Koch today.      [facility] Pre-operative Risk Assessment    Request for surgical clearance:  1. What type of surgery is being performed? ALVEOLOPLASTY    2. When is this surgery scheduled? 09/27/19   3. What type of clearance is required (medical clearance vs. Pharmacy clearance to hold med vs. Both)? MEDICAL  4. Are there any medications that need to be held prior to surgery and how long? NO MEDS TO BE HELD PER DR. Hoyt Koch   5. Practice name and name of physician performing surgery? SCOTT JENSEN, DMD   6. What is your office phone number [phone]    7.   What is your office fax number [phone]  8.   Anesthesia type (None, local, MAC, general) ? GENERAL BUT NOT INTUBATED   Julaine Hua 09/26/2019, 2:32 PM  _________________________________________________________________   (provider comments below)

## 2019-09-26 NOTE — Telephone Encounter (Signed)
Follow up   Dr Hoyt Koch is calling and is wondering why he needs to send a cardiac clearance form.  He says he is needing clearance for the pt who is having surgery tomorrow     Please advise

## 2019-09-26 NOTE — Telephone Encounter (Signed)
New message   Dr. Diona Browner is calling in reference to the preop clearance. Please call.

## 2019-09-26 NOTE — Telephone Encounter (Signed)
   Primary Cardiologist: Dorris Carnes, MD  Chart reviewed as part of pre-operative protocol coverage. Given past medical history and time since last visit, based on ACC/AHA guidelines, Kimberly Browning would be at acceptable risk for the planned procedure without further cardiovascular testing.     I will route this recommendation to the requesting party via Epic fax function and remove from pre-op pool.  Please call with questions.  Jossie Ng. [facility] [place] [address] Office [phone] Fax [phone]

## 2019-09-26 NOTE — Telephone Encounter (Signed)
Still need official clearance form from Dr. Lupita Leash office in order to send formal clearance. We can fax what we have so far. But need the form for documentation. Please call them and let them know.   Jory Sims DNP,ANP, AACC

## 2019-10-01 ENCOUNTER — Ambulatory Visit: Payer: Medicare Other | Attending: Internal Medicine

## 2019-10-01 DIAGNOSIS — Z23 Encounter for immunization: Secondary | ICD-10-CM

## 2019-10-01 NOTE — Progress Notes (Signed)
   Covid-19 Vaccination Clinic  Name:  TANGULA HETMAN    MRN: [id number] DOB: 1975/06/28  10/01/2019  Ms. Methvin was observed post Covid-19 immunization for 15 minutes without incident. She was provided with Vaccine Information Sheet and instruction to access the V-Safe system.   Ms. Dauphine was instructed to call 911 with any severe reactions post vaccine: Marland Kitchen Difficulty breathing  . Swelling of face and throat  . A fast heartbeat  . A bad rash all over body  . Dizziness and weakness   Immunizations Administered    Name Date Dose VIS Date Route   Pfizer COVID-19 Vaccine 10/01/2019 10:16 AM 0.3 mL 08/01/2018 Intramuscular   Manufacturer: [place]   Lot: [id number]   [place]: [id number]

## 2019-10-02 ENCOUNTER — Ambulatory Visit: Payer: Medicare Other | Admitting: Family Medicine

## 2019-10-02 NOTE — Progress Notes (Deleted)
   Subjective:   Patient ID: Kimberly Browning    DOB: [birth year], 44 y.o. female   MRN: [id number]  Kimberly Browning is a 44 y.o. female with a history of GERD, seborrheic dermatitis, bipolar disorder, depression,Herpes labialis, hyperlipidemia, tobacco user, back pain, insomnia, memory loss, overweight here for follow up.  Overweight  Encounter for weight Loss: Extensive chart review revealing that patient has tried [name] (discontinued due to GI upset), pheniramine (unable to tolerate), liraglutide 3 mg (unable to afford).  Was recently discontinued from phentermine as it was felt to be not a good long-term option.  Patient notes worsening of her depression due to her weight gain.  She is not physically active.  She has endorsed worsening of her weight due to her recent smoking cessation.  She was started on bupropion 150mg  BID. This was discussed with her psychiatrist who felt it was safe to take with her other psychiatric medications. She notes ***  Tobacco User: Smoked *** pack/day x *** years (*** pack year history). Does ***not qualify for lung cancer screen at this time.    Review of Systems:  Per HPI.   Objective:   There were no vitals taken for this visit. Vitals and nursing note reviewed.  General: well nourished, well developed, in no acute distress with non-toxic appearance HEENT: normocephalic, atraumatic, moist mucous membranes Neck: supple, non-tender without lymphadenopathy CV: regular rate and rhythm without murmurs, rubs, or gallops, no lower extremity edema Lungs: clear to auscultation bilaterally with normal work of breathing Abdomen: soft, non-tender, non-distended, no masses or organomegaly palpable, normoactive bowel sounds Skin: warm, dry, no rashes or lesions Extremities: warm and well perfused, normal tone MSK: ROM grossly intact, strength intact, gait normal Neuro: Alert and oriented, speech normal  Assessment & Plan:   No problem-specific Assessment & Plan  notes found for this encounter.  No orders of the defined types were placed in this encounter.  No orders of the defined types were placed in this encounter.  Patient was counseled on the risks of tobacco use and cessation strongly encouraged.  Mina Marble, DO PGY-2, [facility] 10/02/2019 7:48 AM

## 2019-10-23 ENCOUNTER — Other Ambulatory Visit: Payer: Self-pay

## 2019-10-23 ENCOUNTER — Ambulatory Visit (INDEPENDENT_AMBULATORY_CARE_PROVIDER_SITE_OTHER): Payer: Medicare Other | Admitting: Family Medicine

## 2019-10-23 ENCOUNTER — Ambulatory Visit: Payer: Medicare Other | Admitting: Family Medicine

## 2019-10-23 ENCOUNTER — Encounter: Payer: Self-pay | Admitting: Family Medicine

## 2019-10-23 VITALS — BP 108/60 | HR 87 | Ht 64.0 in | Wt 161.4 lb

## 2019-10-23 DIAGNOSIS — F17200 Nicotine dependence, unspecified, uncomplicated: Secondary | ICD-10-CM | POA: Diagnosis not present

## 2019-10-23 DIAGNOSIS — B351 Tinea unguium: Secondary | ICD-10-CM

## 2019-10-23 DIAGNOSIS — E663 Overweight: Secondary | ICD-10-CM | POA: Diagnosis present

## 2019-10-23 MED ORDER — TERBINAFINE HCL 250 MG PO TABS: 250.0000 mg | ORAL_TABLET | Freq: Every day | ORAL | 0 refills | Status: DC

## 2019-10-23 NOTE — Patient Instructions (Signed)
Thank you for coming to see me today. It was a pleasure to see you.   Let me know if you want to restart the Wellbutrin. Please talk to your psychologist first to ensure that it is safe to restart with your prior med changes.  Congratulations to successfully quitting smoking!!!  I have obtained a fungal culture of your toe nail. I will call you with the results if changes are needed. I have called in a medicine called Terbinafine. take this medicine every day once a day for 3 months.   Please follow-up with me in 3 months if no improvement in your toe nail. Schedule a follow up appointment if you want to further discuss birth control options.  If you have any questions or concerns, please do not hesitate to call the office at [phone].  Take Care,   Dr. Mina Marble, DO Resident Physician [place] [phone]

## 2019-10-23 NOTE — Progress Notes (Signed)
Subjective:   Patient ID: Kimberly Browning    DOB: 1976/02/07, 44 y.o. female   MRN: [id number]  Kimberly Browning is a 44 y.o. female with a history of seborrheic dermatitis, back pain, bipolar disorder, depression with anxiety, herpes labialis, hyperlipidemia, insomnia, memory loss, overweight, tobacco user here for smoking cessation and weight loss follow-up.  Weight loss Follow up: Patient following up today for continued monitoring of weight loss.  She endorses depressive symptoms secondary to her weight.  She is very motivated for weight loss.  She is not physically active.  During last discussion patient noted that some of her weight gain was secondary to her smoking cessation.  She is currently on Wellbutrin 150 mg twice daily however patient notes she stopped it after 2 weeks because "it wasn't working". She was referred to weight management clinic but did not qualify for treatment based on BMI.  Past treatments include Metformin, pheniramine, Saxenda, and phentermine. Last weight 156, weight today 161.    Tobacco user: Patient has stopped smoking since March 30th. She notes she couldn't afford it.  She smoked 4-6 cigs/day since she was 44 years old. Pack years ~5.   Onychomycosis: Left great toe with thickened yellow toe nail. Has tried filing it down and special polish. She has not tried any fungal creams.   Review of Systems:  Per HPI.   Objective:   BP 108/60   Pulse 87   Ht 5\' 4"  (1.626 m)   Wt 161 lb 6.4 oz (73.2 kg)   LMP 10/19/2019 (Exact Date)   SpO2 98%   BMI 27.70 kg/m  Vitals and nursing note reviewed.  General: pleasant middle age female, well nourished, well developed, in no acute distress with non-toxic appearance, sitting comfortably in exam chair CV: regular rate and rhythm without murmurs, rubs, or gallops Lungs: clear to auscultation bilaterally with normal work of breathing Skin: warm, dry, thickened yellow left great toe nail (entire nail involved), no other  nails involved Extremities: warm and well perfused MSK:  gait normal Neuro: Alert and oriented, speech normal    Assessment & Plan:   TOBACCO USER Quit smoking April 2021. Congratulated patient on success!  Overweight (BMI 25.0-29.9) Patient started on Wellbutrin for weight loss, depression, and smoking cessation. She self discontinued after 2 weeks. Not a candidate for weight loss clinic. She has gained 5lbs since last visit in March 2021. Has been on multiple other medical treatments in the past with either intolerance or minimal improvement. Explained to her that going further there is very little to offer, particularly if she is going to self discontinue medications. Highly recommended she focus on life style modifications such as diet and exercise. If she is interested in restarting Wellbutrin, then she should discuss with her psychiatrist and me and we will restart. She voiced understanding and agreement with plan.   Onychomycosis of left great toe Fungal culture obtained. Last CMP obtained in March 2021 with normal liver function. 3 month trial of Terbinafine provided. Will call patient if changes to management are indicated.   Orders Placed This Encounter  Procedures  . Fungus culture w smear    Order Specific Question:   Source    Answer:   Left great toe nail   Meds ordered this encounter  Medications  . terbinafine (LAMISIL) 250 MG tablet    Sig: Take 1 tablet (250 mg total) by mouth daily.    Dispense:  90 tablet    Refill:  0  Mina Marble, DO PGY-2, [facility] 10/24/2019 11:33 AM

## 2019-10-24 ENCOUNTER — Other Ambulatory Visit: Payer: Self-pay | Admitting: Family Medicine

## 2019-10-24 ENCOUNTER — Telehealth: Payer: Self-pay | Admitting: Family Medicine

## 2019-10-24 DIAGNOSIS — B351 Tinea unguium: Secondary | ICD-10-CM | POA: Insufficient documentation

## 2019-10-24 NOTE — Assessment & Plan Note (Signed)
Quit smoking April 2021. Congratulated patient on success!

## 2019-10-24 NOTE — Assessment & Plan Note (Signed)
Fungal culture obtained. Last CMP obtained in March 2021 with normal liver function. 3 month trial of Terbinafine provided. Will call patient if changes to management are indicated.

## 2019-10-24 NOTE — Telephone Encounter (Signed)
Pt want doctor to know the new medication she is taking: Lamotrigine 25 mg

## 2019-10-24 NOTE — Assessment & Plan Note (Addendum)
Patient started on Wellbutrin for weight loss, depression, and smoking cessation. She self discontinued after 2 weeks. Not a candidate for weight loss clinic. She has gained 5lbs since last visit in March 2021. Has been on multiple other medical treatments in the past with either intolerance or minimal improvement. Explained to her that going further there is very little to offer, particularly if she is going to self discontinue medications. Highly recommended she focus on life style modifications such as diet and exercise. If she is interested in restarting Wellbutrin, then she should discuss with her psychiatrist and me and we will restart. She voiced understanding and agreement with plan.

## 2019-11-06 ENCOUNTER — Other Ambulatory Visit: Payer: Self-pay

## 2019-11-08 MED ORDER — RABEPRAZOLE SODIUM 20 MG PO TBEC: 20.0000 mg | DELAYED_RELEASE_TABLET | Freq: Every day | ORAL | 1 refills | Status: DC

## 2019-11-16 LAB — FUNGUS CULTURE W SMEAR

## 2019-11-23 ENCOUNTER — Encounter: Payer: Self-pay | Admitting: Family Medicine

## 2019-11-23 ENCOUNTER — Other Ambulatory Visit: Payer: Self-pay

## 2019-11-23 ENCOUNTER — Ambulatory Visit (INDEPENDENT_AMBULATORY_CARE_PROVIDER_SITE_OTHER): Payer: Medicare Other | Admitting: Family Medicine

## 2019-11-23 VITALS — BP 102/62 | HR 76 | Ht 64.0 in | Wt 169.0 lb

## 2019-11-23 DIAGNOSIS — Z30014 Encounter for initial prescription of intrauterine contraceptive device: Secondary | ICD-10-CM | POA: Diagnosis present

## 2019-11-23 DIAGNOSIS — E663 Overweight: Secondary | ICD-10-CM | POA: Diagnosis not present

## 2019-11-23 NOTE — Progress Notes (Signed)
   Subjective:   Patient ID: Kimberly Browning    DOB: 1975/10/02, 44 y.o. female   MRN: [id number]  Kimberly Browning is a 44 y.o. female  here for contraceptive counseling.  Contraceptive Counseling: Patient is here today to discuss birth control options. She is interested in the IUD. She had this before and had success. LMP 11/18/2019. She is sexually active with one female partner for 10 years. Last pap-smear was on 11/01/2018 by last PCP that was WNL (see scanned records from prior PCP)  Review of Systems:  Per HPI.   Objective:   BP 102/62   Pulse 76   Ht 5\' 4"  (1.626 m)   Wt 169 lb (76.7 kg)   SpO2 99%   BMI 29.01 kg/m  Vitals and nursing note reviewed.  General: pleasant middle aged female, well nourished, well developed, in no acute distress with non-toxic appearance, sitting comfortably in exam chair Resp: speaking in full sentences, breathing comfortably on room air MSK: gait normal Neuro: Alert and oriented, speech normal  Assessment & Plan:   Overweight (BMI 25.0-29.9) Patient is still not taking the Buproprion. She is scheduled to see her psychiatrist on 11/25/19 in which she will discuss restarting. She was started on this medication to help with smoking cessation, depression, and weight loss. She is very interested in nutrition clinic. She is scheduled for Nutrition Clinic MyChart visit with me for 11/29/19.  Encounter for initial prescription of intrauterine contraceptive device (IUD) Patient interested in [place] IUD. Up to date on Pap-smear. Scheduled for IUD insertion with me on 11/28/19 at 2:30 pm. She will need U-preg prior to procedure.    Mina Marble, DO PGY-2, [facility] 11/25/2019 9:08 AM

## 2019-11-25 ENCOUNTER — Encounter: Payer: Self-pay | Admitting: Family Medicine

## 2019-11-25 DIAGNOSIS — Z30014 Encounter for initial prescription of intrauterine contraceptive device: Secondary | ICD-10-CM | POA: Insufficient documentation

## 2019-11-25 NOTE — Assessment & Plan Note (Signed)
Patient is still not taking the Buproprion. She is scheduled to see her psychiatrist on 11/25/19 in which she will discuss restarting. She was started on this medication to help with smoking cessation, depression, and weight loss. She is very interested in nutrition clinic. She is scheduled for Nutrition Clinic MyChart visit with me for 11/29/19.

## 2019-11-25 NOTE — Assessment & Plan Note (Signed)
Patient interested in [place] IUD. Up to date on Pap-smear. Scheduled for IUD insertion with me on 11/28/19 at 2:30 pm. She will need U-preg prior to procedure.

## 2019-11-27 ENCOUNTER — Ambulatory Visit: Payer: Medicare Other | Admitting: Family Medicine

## 2019-11-28 ENCOUNTER — Encounter: Payer: Self-pay | Admitting: Family Medicine

## 2019-11-28 ENCOUNTER — Other Ambulatory Visit: Payer: Self-pay

## 2019-11-28 ENCOUNTER — Ambulatory Visit (INDEPENDENT_AMBULATORY_CARE_PROVIDER_SITE_OTHER): Payer: Medicare Other | Admitting: Family Medicine

## 2019-11-28 VITALS — BP 122/74 | HR 68 | Ht 64.0 in | Wt 169.0 lb

## 2019-11-28 DIAGNOSIS — Z538 Procedure and treatment not carried out for other reasons: Secondary | ICD-10-CM

## 2019-11-28 DIAGNOSIS — Z30014 Encounter for initial prescription of intrauterine contraceptive device: Secondary | ICD-10-CM

## 2019-11-28 LAB — POCT URINE PREGNANCY: Preg Test, Ur: NEGATIVE

## 2019-11-28 NOTE — Patient Instructions (Signed)
Thank you for coming to see me today. It was a pleasure to see you.   I am so sorry that we were unable to complete your IUD insertion today. I have placed a referral to gynecology in order for them to further evaluate and complete the procedure. Please be sure to use condoms to protect herself from pregnancy and STDs until you can have this placed.  If you have any questions or concerns, please do not hesitate to call the office at [phone].  Take Care,  Dr. Mina Marble, DO Resident Physician [place] [phone]

## 2019-11-28 NOTE — Progress Notes (Signed)
   Subjective:   Patient ID: Kimberly Browning    DOB: March 08, 1976, 44 y.o. female   MRN: [id number]  Kimberly Browning is a 44 y.o. female here for IUD insertion.  Patient presenting today for IUD insertion. She has had this before without complications.  Sexually active with one female partner. She does not use condoms. Denies dysuria, hematuria, vaginal itching, discharge, or bleeding. LMP 11/25/19 - regular, heavy but only lasts one day.    Review of Systems:  Per HPI.   Objective:   BP 122/74   Pulse 68   Ht 5\' 4"  (1.626 m)   Wt 169 lb (76.7 kg)   LMP 11/25/2019   SpO2 96%   BMI 29.01 kg/m  Vitals and nursing note reviewed.  General: pleasant middle aged female, sitting comfortably on exam bed, well nourished, well developed, in no acute distress with non-toxic appearance Resp: speaking in full sentences, breathing comfortably on room air Skin: warm, dry Extremities: warm and well perfused MSK:  gait normal Neuro: Alert and oriented, speech normal Pelvic exam: VULVA: normal appearing vulva with no masses, tenderness or lesions, VAGINA: normal appearing vagina with normal color and discharge, no lesions, CERVIX: normal appearing cervix without discharge or lesions.  Procedure for Insertion of Mirena IUD Indication: Contraception Assisted by: Victorio Palm  Patient was counseled regarding the risks/benefits/alternatives of the IUD. Urine pregnancy test negative . Consent was obtained and all questions answered.  Time out performed.  Description: Cervix was swabbed three times with Betadine swabs. Sterile gloves donned. Sterile single-tooth tenaculum used to grasp anterior lip of the cervix and straighten the endocervical canal. Unable to insert sound completely through internal cervical os. Attending Dr. Ardelia Mems was consulted for assistance. Sound still unable to be inserted through internal cervical os.  Procedure was discontinued at this time. Referral to Gynecology was placed  for evaluation and procedure.  Patient tolerated the procedure well.  Assessment & Plan:   Unsuccessful IUD insertion Difficulty passing sound through internal cervical os. Concern for cervical stenosis. Procedure was stopped prematurly to prevent perforation or other complications. Offered alternative contraceptive measures but patient declined. Patient referred to gynecology for evaluation and procedure. Patient toleratd procedure well with no post-procedure complications. She was instructed to wear a condom for protection against pregnancy and STDs.  Patient voiced understanding and agreement.   Orders Placed This Encounter  Procedures  . Ambulatory referral to Gynecology    Referral Priority:   Routine    Referral Type:   Consultation    Referral Reason:   Specialty Services Required    Requested Specialty:   Gynecology    Number of Visits Requested:   1  . POCT urine pregnancy   Mina Marble, DO PGY-2, [facility] 11/29/2019 7:44 AM

## 2019-11-29 ENCOUNTER — Ambulatory Visit (INDEPENDENT_AMBULATORY_CARE_PROVIDER_SITE_OTHER): Payer: Medicare Other | Admitting: Family Medicine

## 2019-11-29 ENCOUNTER — Ambulatory Visit: Payer: Medicare Other

## 2019-11-29 DIAGNOSIS — Z713 Dietary counseling and surveillance: Secondary | ICD-10-CM | POA: Diagnosis not present

## 2019-11-29 DIAGNOSIS — E663 Overweight: Secondary | ICD-10-CM | POA: Diagnosis not present

## 2019-11-29 DIAGNOSIS — Z538 Procedure and treatment not carried out for other reasons: Secondary | ICD-10-CM | POA: Insufficient documentation

## 2019-11-29 NOTE — Patient Instructions (Addendum)
Goals: 1. I will do at least 30 minutes Zoomba 3 times per week on Monday, Tuesday, and Wednesday  2. I will eat within 1-hour of waking up 3. I will eat 3 meals and 2 snacks a day using the Plate method  4. Make three lists of fruits/vegetables: (1) those you like and eat now; (2) vegetables/fruits you won't even consider; and (3) vegetables/fruits you might consider trying if they are prepared a certain way.  Continue to eat veg's/frutis you currently eat, but from this last list, choose a vegetable to try at least 3 times a week.  Use small amounts of this vegetable, cut small, combined with foods or seasonings you like.    Obtain twice the volume of vegetables as either protein or starchy foods for both lunch and dinner.  You may read about the Mediterranean and DASH diets at: CallRank.tn.

## 2019-11-29 NOTE — Addendum Note (Signed)
Addended by: Danna Hefty on: 11/29/2019 11:28 AM   Modules accepted: Level of Service

## 2019-11-29 NOTE — Assessment & Plan Note (Signed)
Difficulty passing sound through internal cervical os. Concern for cervical stenosis. Procedure was stopped prematurly to prevent perforation or other complications. Offered alternative contraceptive measures but patient declined. Patient referred to gynecology for evaluation and procedure. Patient toleratd procedure well with no post-procedure complications. She was instructed to wear a condom for protection against pregnancy and STDs.  Patient voiced understanding and agreement.

## 2019-11-29 NOTE — Progress Notes (Signed)
Telehealth Encounter I connected with Kimberly Browning (MRN [id number]) on 11/29/2019 by MyChart video-enabled, HIPAA-compliant telemedicine application, verified that I was speaking with the correct person using two identifiers, and that the patient was in a private environment conducive to confidentiality.  The patient agreed to proceed.  Provider was Mina Marble, DO and Kennith Center, PhD, RD, LDN, CEDRD Provider was located at [facility] during this telehealth encounter; patient was at home  Appt start time: 1000 end time: 1045 (45 minutes)  Reason for telehealth visit: Referred by Dr. Mina Marble for Medical Nutrition Therapy related to overweight (BMI 25-29), HLD  Relevant history/background: Kimberly Browning is a 44 y.o. female presenting today for nutrition clinic with goal of weight loss. She endorses depression secondary to her weight. She is motivated for weight loss. Not physically active. Has trialed Metformin - d/c'd due to GI upset, Pheniramine - unable to tolerate, Saxenda 3mg  but was discontinued as it was "not appropriate" per last PCP. Has had success with Phentermine 37.5mg  but developed insomnia - opted to discontinued given unlikely long term improvement.  Referred to Weight Management clinic but she did not qualify based on BMI. Trialed Wellbutrin in hopes to improve smoking cessation, depression, and weight loss but this was self-discontinued. She plans to further discuss with her psychiatrist about restarting given other psychiatric medications.  Patient's Objective for Visit:  To lose weight with goal of 130lbs  Current Weight: 169lbs (11/28/19)  Assessment:  Usual eating pattern: 1 meal and 1 snack per day. Frequent foods and beverages: chicken, green peas/beans, macaroni, milk, cheerios, Hint flavored water, occasionally tea, coffee - hazelnut creamer, Cinnamon rice cakes, occasionally beans if cooked Avoided foods: starchy foods (pasta), bread, meat,  fish/seafood, cheese/yogurt  Usual physical activity: None  - plans to start on Monday  Sleep: Estimates average of 10-11 hours of sleep/night.  24-hr recall  (Up at 10 AM) B (10 AM)-  2c coffee with 4 packet hazelnut creamers (30cal/packet) Snk ( AM)-  ------------------------------------------------ L ( PM)-  ------------------------------------------------ Snk ( PM)-  ------------------------------------------------ D (4:45 PM)-  Salad: ice-burg lettuce + shredded cheese 1oz + home made ranch (~1 tsbp) Snk (9:15 PM)-  1c cereal + 0.5c 2% milk  Typical day? Yes.     Intervention: Completed diet and exercise history, and established behavioral goals.  Goals: 1. I will do at least 30 minutes Zoomba 3 times per week on Monday, Tuesday, and Wednesday  2. I will eat within 1-hour of waking up 3. I will eat 3 meals and 2 snacks a day using the Plate method  4. I will creat a list of fruits and vegetables that I enjoy, dont enjoy, and would enjoy if prepared well  For recommendations and goals, see Patient Instructions.    Follow-up: 12/27/19 with Dr. Tarry Kos at 10:30 via [place], [place], PGY2 11/29/2019 8:10 AM

## 2019-12-27 ENCOUNTER — Ambulatory Visit (INDEPENDENT_AMBULATORY_CARE_PROVIDER_SITE_OTHER): Payer: Medicare Other | Admitting: Family Medicine

## 2019-12-27 ENCOUNTER — Other Ambulatory Visit: Payer: Self-pay

## 2019-12-27 ENCOUNTER — Encounter: Payer: Self-pay | Admitting: Family Medicine

## 2019-12-27 VITALS — BP 118/75 | HR 91 | Ht 64.0 in | Wt 169.0 lb

## 2019-12-27 DIAGNOSIS — Z713 Dietary counseling and surveillance: Secondary | ICD-10-CM | POA: Diagnosis present

## 2019-12-27 DIAGNOSIS — E663 Overweight: Secondary | ICD-10-CM | POA: Diagnosis not present

## 2019-12-27 NOTE — Progress Notes (Signed)
   Subjective:   Patient ID: Kimberly Browning    DOB: Oct 19, 1975, 44 y.o. female   MRN: [id number]  Kimberly Browning is a 44 y.o. female with a history of GERD, onychomycosis of left great toe, bipolar disorder, depression, urge incontinence, vitamin d deficiency, HLD, herpes labialis, insomnia, overweight  here for nutrition follow up.   Provider was Dr. Mina Marble, DO Provider was located at [facility] during this telehealth encounter; patient was at home  Appt start time: 1030 end time: 1130 (1 hour)  Reason for telehealth visit: Referred by Dr. Mina Marble for Medical Nutrition Therapy related to overweight (BMI 25-29), HLD  Relevant history/background:  Kimberly Browning is a 44 y.o. female presenting today for follow up for continued nutrition counseling with goal of weight loss. She endorses history of depression secondary to her weight. She is motivated for weight loss.  Has trialed Metformin - d/c'd due to GI upset, Pheniramine - unable to tolerate, Saxenda 3mg  but was discontinued as it was "not appropriate" per last PCP. Has had success with Phentermine 37.5mg  but developed insomnia - opted to discontinued given unlikely long term improvement.  Referred to Weight Management clinic but she did not qualify based on BMI. Trialed Wellbutrin in hopes to improve smoking cessation, depression, and weight loss but this was self-discontinued. She plans to further discuss with her psychiatrist about restarting given other psychiatric medications.  She was to start zoomba with her sister but has not had the chance due to different circumstances.   She has been doing better in regards to her meals. She is looking at the nutrition label more often.  She has bought some frozen green beans and peas to cook on her own. She created her veggie list that includes trying squash, cooked onions and cherries. She also likes strawberries.   Patient's Objective for Visit:  To lose weight with  goal of 130lbs  Assessment:  Physical Activity: Hasnt started  Sleep: Estimates average of 8 hours of sleep/night.  24-hr recall: (Up at 9 AM) B (9:30 AM)-  Coffee 16oz with 2 hazelnut creamer, no sugar + Keto bar (140 cal) Snk ( AM)-  ----------------------- L (12 PM)-  24 oz Smoothie (vanilla protein powder (2scps) + strawberries (1oz) + almond milk (1cup) Snk ( PM)-  --------------------------------------------- D (5:30 PM)-  Green peas + Brown Rice (2tbls) Snk ( PM)-  1c cereal + 0.5c 2% milk Typical day? Yes.     Intervention: Completed diet and exercise history, and established behavioral goals.  Goals: 1. I will do at least 30 minutes Zoomba 3 times per week on Monday, Tuesday, and Wednesday  2.Eat at least 3 REAL meals and 1-2 snacks per day.  Eat breakfast within one hour of getting up.  Aim for no more than 5 hours between eating.    A REAL meal includes at least some protein, some starch, and vegetables and/or fruit.    (OR: Would you serve this to a guest in your home, and call it a meal?) 3. Obtain twice the volume of vegetables as either protein or starchy foods for both lunch and dinner.  For recommendations and goals, see Patient Instructions.    Follow-up: as needed  Mina Marble, [place], PGY3 12/27/2019 11:01 AM

## 2019-12-27 NOTE — Patient Instructions (Signed)
Goals: 1. I will do at least 30 minutes Zoomba 3 times per week on Monday, Tuesday, and Wednesday  2.Eat at least 3 REAL meals and 1-2 snacks per day.  Eat breakfast within one hour of getting up.  Aim for no more than 5 hours between eating.    A REAL meal includes at least some protein, some starch, and vegetables and/or fruit.    (OR: Would you serve this to a guest in your home, and call it a meal?) 3. Obtain twice the volume of vegetables as either protein or starchy foods for both lunch and dinner.  Make three lists of vegetables: (1) those you like and eat now; (2) vegetables you won't even consider; and (3) vegetables you might consider trying if they are prepared a certain way.  Continue to eat veg's you currently eat, but from this last list, choose a vegetable to try at least 3 times a week.  Use small amounts of this vegetable, cut small, combined with foods or seasonings you like.    You may read about the Mediterranean and DASH diets at: CallRank.tn.

## 2019-12-30 DIAGNOSIS — R3914 Feeling of incomplete bladder emptying: Secondary | ICD-10-CM | POA: Insufficient documentation

## 2019-12-30 DIAGNOSIS — L853 Xerosis cutis: Secondary | ICD-10-CM | POA: Insufficient documentation

## 2019-12-30 DIAGNOSIS — R351 Nocturia: Secondary | ICD-10-CM | POA: Insufficient documentation

## 2020-01-17 ENCOUNTER — Ambulatory Visit (INDEPENDENT_AMBULATORY_CARE_PROVIDER_SITE_OTHER): Payer: Medicare Other | Admitting: Family Medicine

## 2020-01-17 ENCOUNTER — Other Ambulatory Visit: Payer: Self-pay

## 2020-01-17 DIAGNOSIS — B351 Tinea unguium: Secondary | ICD-10-CM | POA: Diagnosis present

## 2020-01-17 NOTE — Assessment & Plan Note (Addendum)
Toe nail removal today per patient's request. Dr Gwendlyn Deutscher carried out procedure under sterile conditions. Please see her attestation. Follow up with PCP.

## 2020-01-17 NOTE — Progress Notes (Signed)
    SUBJECTIVE:   CHIEF COMPLAINT / HPI:   Kimberly Browning is a 44 yr old female who presents today with left toe complaint  Right toe pain and discoloration Pt was seen in clinic 3 months ago for left toe onychomycosis treated with 3 months of Terbinafine. She took full course of medication. Toe nail has improved in color but she is concerned about a dark spot in the corner of the nail and she experiences pain radiating up and down the foot. She thinks the toe nail may be ingrown and wants it removed. She has had multiple ingrown toe nails which were removed and they successfully grew back.    PERTINENT  PMH / PSH: Onychomycosis   OBJECTIVE:   BP 110/75   Pulse 78   Ht 5\' 4"  (1.626 m)   Wt 169 lb (76.7 kg)   SpO2 99%   BMI 29.01 kg/m       General: Alert, no acute distress Cardio: well perfused  Pulm:  Normal respiratory effort Extremities: No peripheral edema. Warm/ well perfused. Dorsalis pedis present. Well healed right nail bed of right toe. Peripheral nail with areas of onychomycosis. Non tender of palpation     ASSESSMENT/PLAN:   Onychomycosis of left great toe Toe nail removal today per patient's request. Dr Gwendlyn Deutscher carried out procedure under sterile conditions. Please see her attestation. Follow up with PCP.     Lattie Haw, MD [place]

## 2020-01-17 NOTE — Patient Instructions (Signed)
It was nice seeing you today. I am glad the toenail removal went well. Please keep your toe clean, and change dressing daily or every other day. Use tylenol or ibuprofen as needed for pain. Call us soon if you have bleeding or abnormal discharge from your nail. Follow-up with your PCP for other health need.   Wound Care, Adult Taking care of your wound properly can help to prevent pain, infection, and scarring. It can also help your wound to heal more quickly. How to care for your wound Wound care      Follow instructions from your health care provider about how to take care of your wound. Make sure you: ? Wash your hands with soap and water before you change the bandage (dressing). If soap and water are not available, use hand sanitizer. ? Change your dressing as told by your health care provider. ? Leave stitches (sutures), skin glue, or adhesive strips in place. These skin closures may need to stay in place for 2 weeks or longer. If adhesive strip edges start to loosen and curl up, you may trim the loose edges. Do not remove adhesive strips completely unless your health care provider tells you to do that.  Check your wound area every day for signs of infection. Check for: ? Redness, swelling, or pain. ? Fluid or blood. ? Warmth. ? Pus or a bad smell.  Ask your health care provider if you should clean the wound with mild soap and water. Doing this may include: ? Using a clean towel to pat the wound dry after cleaning it. Do not rub or scrub the wound. ? Applying a cream or ointment. Do this only as told by your health care provider. ? Covering the incision with a clean dressing.  Ask your health care provider when you can leave the wound uncovered.  Keep the dressing dry until your health care provider says it can be removed. Do not take baths, swim, use a hot tub, or do anything that would put the wound underwater until your health care provider approves. Ask your health care provider  if you can take showers. You may only be allowed to take sponge baths. Medicines   If you were prescribed an antibiotic medicine, cream, or ointment, take or use the antibiotic as told by your health care provider. Do not stop taking or using the antibiotic even if your condition improves.  Take over-the-counter and prescription medicines only as told by your health care provider. If you were prescribed pain medicine, take it 30 or more minutes before you do any wound care or as told by your health care provider. General instructions  Return to your normal activities as told by your health care provider. Ask your health care provider what activities are safe.  Do not scratch or pick at the wound.  Do not use any products that contain nicotine or tobacco, such as cigarettes and e-cigarettes. These may delay wound healing. If you need help quitting, ask your health care provider.  Keep all follow-up visits as told by your health care provider. This is important.  Eat a diet that includes protein, vitamin A, vitamin C, and other nutrient-rich foods to help the wound heal. ? Foods rich in protein include meat, dairy, beans, nuts, and other sources. ? Foods rich in vitamin A include carrots and dark green, leafy vegetables. ? Foods rich in vitamin C include citrus, tomatoes, and other fruits and vegetables. ? Nutrient-rich foods have protein, carbohydrates, fat, vitamins,  or minerals. Eat a variety of healthy foods including vegetables, fruits, and whole grains. Contact a health care provider if:  You received a tetanus shot and you have swelling, severe pain, redness, or bleeding at the injection site.  Your pain is not controlled with medicine.  You have redness, swelling, or pain around the wound.  You have fluid or blood coming from the wound.  Your wound feels warm to the touch.  You have pus or a bad smell coming from the wound.  You have a fever or chills.  You are nauseous or  you vomit.  You are dizzy. Get help right away if:  You have a red streak going away from your wound.  The edges of the wound open up and separate.  Your wound is bleeding, and the bleeding does not stop with gentle pressure.  You have a rash.  You faint.  You have trouble breathing. Summary  Always wash your hands with soap and water before changing your bandage (dressing).  To help with healing, eat foods that are rich in protein, vitamin A, vitamin C, and other nutrients.  Check your wound every day for signs of infection. Contact your health care provider if you suspect that your wound is infected. This information is not intended to replace advice given to you by your health care provider. Make sure you discuss any questions you have with your health care provider. Document Revised: 09/11/2018 Document Reviewed: 12/09/2015 Elsevier Patient Education  [place].

## 2020-01-21 ENCOUNTER — Other Ambulatory Visit: Payer: Self-pay

## 2020-01-21 ENCOUNTER — Encounter: Payer: Self-pay | Admitting: Family Medicine

## 2020-01-21 ENCOUNTER — Encounter: Payer: Self-pay | Admitting: *Deleted

## 2020-01-21 ENCOUNTER — Ambulatory Visit (INDEPENDENT_AMBULATORY_CARE_PROVIDER_SITE_OTHER): Payer: Medicare Other | Admitting: Family Medicine

## 2020-01-21 ENCOUNTER — Encounter: Payer: Medicare Other | Admitting: Family Medicine

## 2020-01-21 VITALS — BP 124/62 | HR 72 | Wt 168.2 lb

## 2020-01-21 DIAGNOSIS — N926 Irregular menstruation, unspecified: Secondary | ICD-10-CM

## 2020-01-21 DIAGNOSIS — Z30430 Encounter for insertion of intrauterine contraceptive device: Secondary | ICD-10-CM | POA: Diagnosis not present

## 2020-01-21 DIAGNOSIS — Z3202 Encounter for pregnancy test, result negative: Secondary | ICD-10-CM

## 2020-01-21 DIAGNOSIS — F418 Other specified anxiety disorders: Secondary | ICD-10-CM

## 2020-01-21 LAB — POCT PREGNANCY, URINE: Preg Test, Ur: NEGATIVE

## 2020-01-21 MED ORDER — LEVONORGESTREL 19.5 MCG/DAY IU IUD
INTRAUTERINE_SYSTEM | Freq: Once | INTRAUTERINE | Status: AC
  Administered 2020-01-21: 12:00:00 1 via INTRAUTERINE

## 2020-01-21 NOTE — Progress Notes (Signed)
   Subjective:    Patient ID: Kimberly Browning is a 44 y.o. female presenting with IUD Insertion  on 01/21/2020  HPI: Wants IUD insertion, unable to be placed at [facility]. Having night sweats and hot flashes x 2 months. Cycles are irregular  Review of Systems  Constitutional: Negative for chills and fever.  Respiratory: Negative for shortness of breath.   Cardiovascular: Negative for chest pain.  Gastrointestinal: Negative for abdominal pain, nausea and vomiting.  Genitourinary: Negative for dysuria.  Skin: Negative for rash.      Objective:    BP 124/62   Pulse 72   Wt 168 lb 3.2 oz (76.3 kg)   LMP 01/16/2020 (Approximate)   BMI 28.87 kg/m  Physical Exam Constitutional:      General: She is not in acute distress.    Appearance: She is well-developed.  HENT:     Head: Normocephalic and atraumatic.  Eyes:     General: No scleral icterus. Cardiovascular:     Rate and Rhythm: Normal rate.  Pulmonary:     Effort: Pulmonary effort is normal.  Abdominal:     Palpations: Abdomen is soft.  Musculoskeletal:     Cervical back: Neck supple.  Skin:    General: Skin is warm and dry.  Neurological:     Mental Status: She is alert and oriented to person, place, and time.    Procedure: Patient identified, informed consent performed, signed copy in chart, time out was performed.  Urine pregnancy test negative.  Speculum placed in the vagina.  Cervix visualized.  Cleaned with Betadine x 2.  Grasped anteriourly with a single tooth tenaculum. Os finder used to open cervix  Uterus sounded to 7 cm.  Liletta IUD placed per manufacturer's recommendations.  Strings trimmed to 3 cm.   Patient given post procedure instructions and Liletta care card with expiration date.        Assessment & Plan:  Irregular menses - with hot flashes, will check [facility] - Plan: Follicle stimulating hormone, levonorgestrel (LILETTA) 19.5 MCG/DAY IUD  Encounter for insertion of intrauterine contraceptive device  (IUD) - Patient is asked to check IUD strings periodically and follow up in 4-6 weeks for IUD check. - Plan: levonorgestrel (LILETTA) 19.5 MCG/DAY IUD   Total time in review of prior notes, pathology, labs, history taking, review with patient, exam, note writing, discussion of options, plan for next steps, alternatives and risks of treatment: 22 minutes.  Return in about 4 weeks (around 02/18/2020) for iud check.  Donnamae Jude 01/21/2020 1:02 PM

## 2020-01-21 NOTE — Patient Instructions (Signed)
Intrauterine Device Insertion, Care After  This sheet gives you information about how to care for yourself after your procedure. Your health care provider may also give you more specific instructions. If you have problems or questions, contact your health care provider. What can I expect after the procedure? After the procedure, it is common to have:  Cramps and pain in the abdomen.  Light bleeding (spotting) or heavier bleeding that is like your menstrual period. This may last for up to a few days.  Lower back pain.  Dizziness.  Headaches.  Nausea. Follow these instructions at home:  Before resuming sexual activity, check to make sure that you can feel the IUD string(s). You should be able to feel the end of the string(s) below the opening of your cervix. If your IUD string is in place, you may resume sexual activity. ? If you had a hormonal IUD inserted more than 7 days after your most recent period started, you will need to use a backup method of birth control for 7 days after IUD insertion. Ask your health care provider whether this applies to you.  Continue to check that the IUD is still in place by feeling for the string(s) after every menstrual period, or once a month.  Take over-the-counter and prescription medicines only as told by your health care provider.  Do not drive or use heavy machinery while taking prescription pain medicine.  Keep all follow-up visits as told by your health care provider. This is important. Contact a health care provider if:  You have bleeding that is heavier or lasts longer than a normal menstrual cycle.  You have a fever.  You have cramps or abdominal pain that get worse or do not get better with medicine.  You develop abdominal pain that is new or is not in the same area of earlier cramping and pain.  You feel lightheaded or weak.  You have abnormal or bad-smelling discharge from your vagina.  You have pain during sexual  activity.  You have any of the following problems with your IUD string(s): ? The string bothers or hurts you or your sexual partner. ? You cannot feel the string. ? The string has gotten longer.  You can feel the IUD in your vagina.  You think you may be pregnant, or you miss your menstrual period.  You think you may have an STI (sexually transmitted infection). Get help right away if:  You have flu-like symptoms.  You have a fever and chills.  You can feel that your IUD has slipped out of place. Summary  After the procedure, it is common to have cramps and pain in the abdomen. It is also common to have light bleeding (spotting) or heavier bleeding that is like your menstrual period.  Continue to check that the IUD is still in place by feeling for the string(s) after every menstrual period, or once a month.  Keep all follow-up visits as told by your health care provider. This is important.  Contact your health care provider if you have problems with your IUD string(s), such as the string getting longer or bothering you or your sexual partner. This information is not intended to replace advice given to you by your health care provider. Make sure you discuss any questions you have with your health care provider. Document Revised: 05/06/2017 Document Reviewed: 04/14/2016 Elsevier Patient Education  2020 Elsevier Inc.  

## 2020-01-22 LAB — FOLLICLE STIMULATING HORMONE: FSH: 6 m[IU]/mL

## 2020-03-18 ENCOUNTER — Encounter: Payer: Self-pay | Admitting: Family Medicine

## 2020-03-18 ENCOUNTER — Telehealth: Payer: Self-pay | Admitting: Family Medicine

## 2020-03-18 ENCOUNTER — Ambulatory Visit (INDEPENDENT_AMBULATORY_CARE_PROVIDER_SITE_OTHER): Payer: Medicare Other | Admitting: Family Medicine

## 2020-03-18 ENCOUNTER — Other Ambulatory Visit: Payer: Self-pay

## 2020-03-18 VITALS — BP 120/64 | HR 88 | Ht 64.0 in | Wt 182.6 lb

## 2020-03-18 DIAGNOSIS — Z975 Presence of (intrauterine) contraceptive device: Secondary | ICD-10-CM | POA: Diagnosis not present

## 2020-03-18 DIAGNOSIS — R300 Dysuria: Secondary | ICD-10-CM | POA: Diagnosis present

## 2020-03-18 LAB — POCT UA - MICROSCOPIC ONLY

## 2020-03-18 LAB — POCT URINALYSIS DIP (MANUAL ENTRY)
Bilirubin, UA: NEGATIVE
Glucose, UA: NEGATIVE mg/dL
Ketones, POC UA: NEGATIVE mg/dL
Leukocytes, UA: NEGATIVE
Nitrite, UA: NEGATIVE
Protein Ur, POC: NEGATIVE mg/dL
Spec Grav, UA: 1.01 (ref 1.010–1.025)
Urobilinogen, UA: 0.2 E.U./dL
pH, UA: 6 (ref 5.0–8.0)

## 2020-03-18 LAB — POCT WET PREP (WET MOUNT)
Clue Cells Wet Prep Whiff POC: NEGATIVE
Trichomonas Wet Prep HPF POC: ABSENT

## 2020-03-18 NOTE — Telephone Encounter (Signed)
Patient stated she called last month to speak to a nurse about the problems she was having with her IUD. She stated no one has called her, ans she really wants to see Dr Kennon Rounds. When I gave her the appointment, she wanted to be seen sooner. I informed her Dr Kennon Rounds does not work in this office as often as she use to. She wanted to know if she was part-time. I informed her she works at other offices that we have. She wanted to know where. She then asked for the number to the [facility] office. I informed her they really like for her to stay at the same office she's been seen at. She stated she was going to call to see if Dr. Kennon Rounds had something there sooner. She is requesting a call back from the nurse.

## 2020-03-18 NOTE — Progress Notes (Signed)
SUBJECTIVE:   CHIEF COMPLAINT / HPI:  44 yo female who presents for acute concern of dysuria and to discuss her recent IUD placement.  Dysuria-she notes her dysuria started on Saturday, having some stinging when she urinates.  She has not noted any hematuria or pruritus.  She denies any nausea, vomiting, fever, chills, abdominal pain.    Vaginal discharge-she notes this is been present since a week after her IUD was placed on 01/21/2020 at [facility].  She notes she wants to take the IUD out because she has been spotting since, also feels like it has made her acne and weight gain worse.  She notes she has had the same partner for the past 12 years, feels it is a safe monogamous relationship, does not believe that she needs to be tested for any STDs.  She had the Liletta placed, but notes that she had the Mirena prior to that and had no side effects with that.  She notes that she had a previous attempt to place IUD in our clinic that was unsuccessful due to concern for cervical stenosis, thus she would like to follow-up at [facility] to discuss other contraceptive methods.  PERTINENT  PMH / PSH: As above  OBJECTIVE:   BP 120/64   Pulse 88   Ht 5\' 4"  (1.626 m)   Wt 182 lb 9.6 oz (82.8 kg)   SpO2 98%   BMI 31.34 kg/m   General: A&O, NAD HEENT: No sign of trauma, EOM grossly intact Respiratory: normal WOB GI: non-distended, non-tender to palpation Extremities: no peripheral edema. GU: Normal appearance of labia majora and minor, no lesions or lacerations. Vaginal mucosa pink without irritation, atrophy, or lesions, cervix appears pink, non-friable, with IUD strings visualized protruding from internal os. Small amount of clear discharge, no foul odor, no lesions or lacerations appreciated. Neuro: Normal gait, moves all four extremities appropriately Skin: no lesions/rashes visualized Psych: Appropriate mood and affect  Erskine Emery CMA present as chaperone  for entirety of GU exam.  ASSESSMENT/PLAN:   Dysuria - urinalysis negative except for trace blood, which patient notes she has been intermittently spotting since IUD placement 01/2020 - Wet prep shows many bacteria but negative for BV, yeast, or trichomonas- likely normal vaginal bacteria - no signs of trauma, laceration, irritation, or mucosal atrophy on exam today - pt was reassured by these results, has appt next Monday with our clinic and will reassess symptoms if persistant at that time  IUD contraception - pt notes spotting noticed with IUD, as well as weight gain that she feels has started since IUD is placed, as well as an increase in vaginal discharge since about a week since it was placed - we discussed today that weight gain is not a usual side effect from IUD, also discussed OCPs as a treatment for irregular spotting with [place] that is likely to improve with time- she would not like to try these at this time - she states she would really like the copper IUD Paraguard- discussed risks of heavier bleeding with copper IUD, also discussed will need to call insurance to ensure coverage and then we could order to be done here- she states would prefer to return to [facility] - wet prep today without signs of infection, pt declined Gonorrhea/chlamydia screening due to having same long term partner, discussed those can be causes of increased discharge and if she changes her mind to let us know  Lenoria Chime, MD [place]

## 2020-03-18 NOTE — Assessment & Plan Note (Addendum)
-   urinalysis negative except for trace blood, which patient notes she has been intermittently spotting since IUD placement 01/2020 - Wet prep shows many bacteria but negative for BV, yeast, or trichomonas- likely normal vaginal bacteria - no signs of trauma, laceration, irritation, or mucosal atrophy on exam today - pt was reassured by these results, has appt next Monday with our clinic and will reassess symptoms if persistant at that time

## 2020-03-18 NOTE — Telephone Encounter (Signed)
Called and discussed with patient as she called the clinic back and was unsure about which IUD she wanted.  She discussed that due to the weight gain she no longer wants to have the [place] in place.  She notes that one of her other psychiatric medications has changed, she is not sure which one, but does not feel that the weight gain is related to this as she has read the label.  She notes she would like the Liletta removed and the Mirena placed instead. Explained that both devices use same amount of the same hormone and different names and thus replacing one for the other would not make sense as they would be likely to have the same side effects. The main side effect that is why she wants it removed is because of weight gain.  Discussed other IUD options of Paragard, discussed main side effects of paragard IUD, including bleeding and heavy menses and dysmenorrhea, but this can decrease overtime.She denies any copper allergy or history of Wilson's disease.   Discussed risk as we were unable to place IUD at our office previously that we may be unable to place it here again, and she would prefer to try at our office and is aware of the risk if we cannot place it that she may need to go to [facility] as well. Also discussed that she needs to call her insurance to ensure that it is covered since she has already had one IUD placed this year.  Discussed with office staff and will order for Paragard as well as send message to PCP to let her know the plan to replace, appointment scheduled on 04/04/20. She has appointment coming this Monday 03/24/20 in which we will plan to remove the [place] IUD, and discuss other reliable form of contraception until her Paragard can be placed. She notes she does not want to wait until she can have one removed and a Paragard replaced because she does not want to continue to gain weight.  All questions and concerns were addressed today.  Yehuda Savannah MD

## 2020-03-18 NOTE — Assessment & Plan Note (Addendum)
-   pt notes spotting noticed with IUD, as well as weight gain that she feels has started since IUD is placed, as well as an increase in vaginal discharge since about a week since it was placed - we discussed today that weight gain is not a usual side effect from IUD, also discussed OCPs as a treatment for irregular spotting with [place]ie [place] that is likely to improve with time- she would not like to try these at this time - she states she would really like the copper IUD Paraguard- discussed risks of heavier bleeding with copper IUD, also discussed will need to call insurance to ensure coverage and then we could order to be done here- she states would prefer to return to [facility] - wet prep today without signs of infection, pt declined Gonorrhea/chlamydia screening due to having same long term partner, discussed those can be causes of increased discharge and if she changes her mind to let us know

## 2020-03-18 NOTE — Patient Instructions (Addendum)
It was wonderful to see you today.  Please bring ALL of your medications with you to every visit.   Today we talked about:  - Urinalysis without signs of infection - Wet prep showed no signs of infection with yeast, trichomonas, or bacterial vaginosis - We discussed common side effects of your Liletta IUD, and discussed a trial of OCPs to decrease irregular spotting, can call us back if you change your mind - You are planning to call  [facility] to schedule a follow-up appointment to discuss the Copper IUD Paraguard, we discussed it has side effects of heavier vaginal bleeding and that we do not keep in stock in our clinic  - Your next appointment is on Monday 03/24/20 at 1:30 pm at our clinic, we will follow up on your symptoms and make sure you have the follow-up that you need  Thank you for choosing [facility].   Please call [phone] with any questions about today's appointment.  Please be sure to schedule follow up at the front  desk before you leave today.   Yehuda Savannah, MD  Family Medicine

## 2020-03-20 NOTE — Telephone Encounter (Signed)
Returned patients call. Patient did not answer. LM on voicemail to call the office at her convenience if she continues to have questions or concerns.

## 2020-03-24 ENCOUNTER — Ambulatory Visit: Payer: Medicare Other

## 2020-03-26 ENCOUNTER — Other Ambulatory Visit: Payer: Self-pay

## 2020-03-26 ENCOUNTER — Ambulatory Visit (INDEPENDENT_AMBULATORY_CARE_PROVIDER_SITE_OTHER): Payer: Medicare Other | Admitting: Family Medicine

## 2020-03-26 DIAGNOSIS — Z30432 Encounter for removal of intrauterine contraceptive device: Secondary | ICD-10-CM | POA: Diagnosis not present

## 2020-03-26 NOTE — Patient Instructions (Signed)
It was a pleasure to see you today!  Thank you for choosing [facility] for your primary care.  [name] was seen for IUD removal.   Our plans for today were:  We have removed your IUD as requested.   Please follow up as scheduled for your appointment with Dr. Tarry Kos on 10/29   You tolerated the removal well. You may have spotting afterwards     Best Wishes,   Dr. Alba Cory    Intrauterine Device Information An intrauterine device (IUD) is a medical device that is inserted in the uterus to prevent pregnancy. It is a small, T-shaped device that has one or two nylon strings hanging down from it. The strings hang out of the lower part of the uterus (cervix) to allow for future IUD removal. There are two types of IUDs available:  Hormone IUD. This type of IUD is made of plastic and contains the hormone progestin (synthetic progesterone). A hormone IUD may last 3-5 years.  Copper IUD. This type of IUD has copper wire wrapped around it. A copper IUD may last up to 10 years. How is an IUD inserted? An IUD is inserted through the vagina and placed into the uterus with a minor medical procedure. The exact procedure for IUD insertion may vary among health care providers and hospitals. How does an IUD work? Synthetic progesterone in a hormonal IUD prevents pregnancy by:  Thickening cervical mucus to prevent sperm from entering the uterus.  Thinning the uterine lining to prevent a fertilized egg from being implanted there. Copper in a copper IUD prevents pregnancy by making the uterus and fallopian tubes produce a fluid that kills sperm. What are the advantages of an IUD? Advantages of either type of IUD  It is highly effective in preventing pregnancy.  It is reversible. You can become pregnant shortly after the IUD is removed.  It is low-maintenance and can stay in place for a long time.  There are no estrogen-related side effects.  It can be used when  breastfeeding.  It is not associated with weight gain.  It can be inserted right after childbirth, an abortion, or a miscarriage. Advantages of a hormone IUD  If it is inserted within 7 days of your period starting, it works right after it is inserted. If the hormone IUD is inserted at any other time in your cycle, you will need to use a backup method of birth control for 7 days after insertion.  It can make menstrual periods lighter.  It can reduce menstrual cramping.  It can be used for 3-5 years. Advantages of a copper IUD  It works right after it is inserted.  It can be used as a form of emergency birth control if it is inserted within 5 days after having unprotected sex.  It does not interfere with your body's natural hormones.  It can be used for 10 years. What are the disadvantages of an IUD?  An IUD may cause irregular menstrual bleeding for a period of time after insertion.  You may have pain during insertion and have cramping and vaginal bleeding after insertion.  An IUD may cut the uterus (uterine perforation) when it is inserted. This is rare.  An IUD may cause pelvic inflammatory disease (PID), which is an infection in the uterus and fallopian tubes. This is rare, and it usually happens during the first 20 days after the IUD is inserted.  A copper IUD can make your menstrual flow heavier and  more painful. How is an IUD removed?  You will lie on your back with your knees bent and your feet in footrests (stirrups).  A device will be inserted into your vagina to spread apart the vaginal walls (speculum). This will allow your health care provider to see the strings attached to the IUD.  Your health care provider will use a small instrument (forceps) to grasp the IUD strings and pull firmly until the IUD is removed. You may have some discomfort when the IUD is removed. Your health care provider may recommend taking over-the-counter pain relievers, such as ibuprofen,  before the procedure. You may also have minor spotting for a few days after the procedure. The exact procedure for IUD removal may vary among health care providers and hospitals. Is the IUD right for me? Your health care provider will make sure you are a good candidate for an IUD and will discuss the advantages, disadvantages, and possible side effects with you. Summary  An intrauterine device (IUD) is a medical device that is inserted in the uterus to prevent pregnancy. It is a small, T-shaped device that has one or two nylon strings hanging down from it.  A hormone IUD contains the hormone progestin (synthetic progesterone). A copper IUD has copper wire wrapped around it.  Synthetic progesterone in a hormone IUD prevents pregnancy by thickening cervical mucus and thinning the walls of the uterus. Copper in a copper IUD prevents pregnancy by making the uterus and fallopian tubes produce a fluid that kills sperm.  A hormone IUD can be left in place for 3-5 years. A copper IUD can be left in place for up to 10 years.  An IUD is inserted and removed by a health care provider. You may feel some pain during insertion and removal. Your health care provider may recommend taking over-the-counter pain medicine, such as ibuprofen, before an IUD procedure. This information is not intended to replace advice given to you by your health care provider. Make sure you discuss any questions you have with your health care provider. Document Revised: 05/06/2017 Document Reviewed: 06/22/2016 Elsevier Patient Education  [place].

## 2020-03-26 NOTE — Progress Notes (Signed)
    SUBJECTIVE:   CHIEF COMPLAINT / HPI: wants IUD taken out today   Patient would like to have mirena removed today. Patient states that she plans to have a paraguard IUD placed next week on 10/29. Patient reports having a lot of weight gain since having IUD placed, gained 25lbs pounds. Also reports a lot of gas and acne recently. Has new clorazepate prescription for anxiety but does not believe this is related to her weight gain as she started it in early October.    PROCEDURE NOTE: IUD removal Patient given informed consent for IUD removal. She is aware this will stop the birth control method provided by the IUD immediately. Informed consent given and signed copy in the chart.Appropriate time out taken. Patient placed in the lithotomy position and the cervix brought into view using speculum. The IUD strings were identified coming from the cervical os. These strings were grasped with ring forceps, and the IUD withdrawn gently from the uterus. There were no complications and no blood loss. Patient tolerated the procedure well.  PERTINENT  PMH / PSH:  IUD contraception   OBJECTIVE:   BP 119/80   Pulse 88   Ht 5\' 3"  (1.6 m)   Wt 187 lb 9.6 oz (85.1 kg)   SpO2 97%   BMI 33.23 kg/m   General: female appearing stated age in NAD Genitalia:  Normal introitus for age, no external lesions, no vaginal discharge, mucosa pink and moist, no vaginal or cervical lesions, no vaginal atrophy, no friaility or hemorrhage, normal uterus size and position, no adnexal masses or tenderness   ASSESSMENT/PLAN:   Encounter for IUD removal Patient's IUD was removed successfully and patient tolerated procedure well.  Patient to follow up as scheduled for new IUD insertion  Patient counseled on using alternative method of contraception in interim. Patient verbalized understanding.      Eulis Foster, MD [place]

## 2020-03-27 DIAGNOSIS — Z30432 Encounter for removal of intrauterine contraceptive device: Secondary | ICD-10-CM | POA: Insufficient documentation

## 2020-03-27 NOTE — Assessment & Plan Note (Signed)
Patient's IUD was removed successfully and patient tolerated procedure well.  Patient to follow up as scheduled for new IUD insertion  Patient counseled on using alternative method of contraception in interim. Patient verbalized understanding.

## 2020-04-01 ENCOUNTER — Telehealth: Payer: Self-pay

## 2020-04-01 NOTE — Telephone Encounter (Signed)
-----   Message from Danna Hefty, [place] sent at 03/30/2020  9:47 AM EDT ----- Regarding: IUD placement Patient was seen by Dr. Thompson Grayer and Dr. Quentin Cornwall concerning IUD. She has her IUD removed and is scheduled to return on 10/29 to have an alternative IUD replaced. Appears there has been some misinformation. She has stated her desire for the Mirena and the Paraguard. She has stated desire to have her IUD replaced by OBGYN however she is scheduled with me to have paraguard placed on 10/29.   Please confirm with patient regarding which IUD she would like placed and if she would like it placed by Dr. Tarry Kos or her OBGYN. The procedure can be completed safely by either so whichever she prefers.  If she opts to have it done by OBGYN, please cancel her appointment on 10/29 so it can be available if needed by another patient.   Thank you!

## 2020-04-01 NOTE — Telephone Encounter (Signed)
Called patient to clarify placement of IUD.  No answer so LVM for patient to call office concerning appointment on 04/04/2020.  If patient returns call please direct to [place], [place]

## 2020-04-02 NOTE — Telephone Encounter (Signed)
Perfect! Thank you so much for reaching out to her to clarify.

## 2020-04-02 NOTE — Telephone Encounter (Signed)
Patient returned call and verified that she will keep her appointment on 04/04/2020 here at [facility] for insertion of [place] IUD.  Patient states that she will only go to her GYN if there is a problem here with insertion.  [name], [name]

## 2020-04-04 ENCOUNTER — Ambulatory Visit (INDEPENDENT_AMBULATORY_CARE_PROVIDER_SITE_OTHER): Payer: Medicare Other | Admitting: Family Medicine

## 2020-04-04 ENCOUNTER — Other Ambulatory Visit: Payer: Self-pay

## 2020-04-04 ENCOUNTER — Encounter: Payer: Self-pay | Admitting: Family Medicine

## 2020-04-04 VITALS — BP 118/78 | HR 88 | Ht 63.0 in | Wt 187.4 lb

## 2020-04-04 DIAGNOSIS — Z975 Presence of (intrauterine) contraceptive device: Secondary | ICD-10-CM | POA: Diagnosis present

## 2020-04-04 LAB — POCT URINE PREGNANCY: Preg Test, Ur: NEGATIVE

## 2020-04-04 MED ORDER — PARAGARD INTRAUTERINE COPPER IU IUD
1.0000 | INTRAUTERINE_SYSTEM | Freq: Once | INTRAUTERINE | Status: AC
  Administered 2020-04-04: 1 via INTRAUTERINE

## 2020-04-04 NOTE — Progress Notes (Signed)
   Subjective:   Patient ID: Kimberly Browning    DOB: 07/25/1975, 44 y.o. female   MRN: [id number]  Kimberly Browning is a 44 y.o. female here for IUD placement  Encounter for contraception placement: LMP: 03/09/20, light pink bleeding which was normal for her when on IUD Contraception: None, last intercourse was on 10/27 that was unprotected Had L[place] removed due to "weight gain". Interested in [place] insertion.  Need for STD testing: no, declined  Review of Systems:  Per HPI.   Objective:   BP 118/78   Pulse 88   Ht 5\' 3"  (1.6 m)   Wt 187 lb 6.4 oz (85 kg)   SpO2 99%   BMI 33.20 kg/m  Vitals and nursing note reviewed.  General: pleasant middle aged female, sitting comfortably in exam chair, well nourished, well developed, in no acute distress with non-toxic appearance Resp: breathing comfortably on room air, speaking in full sentences Neuro: Alert and oriented, speech normal  Placement of Paragaurd IUD Indication: Contraception Assisted by: April Rumple  Patient was counseled regarding the risks/benefits/alternatives of the IUD. Urine pregnancy test negative . Consent was obtained and all questions answered.  Time out performed.  Description: Cervix was swabbed three times with Betadine swabs. Sterile gloves donned. Sterile single-tooth tenaculum used to grasp anterior lip of the cervix and straighten the endocervical canal. Uterus sounded to 8.5 cm. IUD loaded per manufacturer's instruction and flange set to 8.5 cm. IUD placed per manufacturer's directions. Strings trimmed to 1.5-2 cm. Patient tolerated the procedure well.    Assessment & Plan:   IUD contraception U-preg negative. Patient informed that I could not guarantee negative pregnancy even if pull out method attempted given last unprotected sex was on 10/27. She was informed of the risk of IUD placement today and possibility of termination of any unknown pregnancy. Patient repeated understanding of risks with  placement today and agreed to continue with procedure.  Paraguard inserted without complication. Patient counseled regarding techniques for self-monitoring of IUD and given precautions. Patient notified of removal date and given card. Patient will follow-up in 4 weeks for string check if unable to locate herself.  Orders Placed This Encounter  Procedures  . POCT urine pregnancy   Mina Marble, DO PGY-3, [facility] 04/04/2020 10:14 AM

## 2020-04-04 NOTE — Patient Instructions (Signed)
IUD AFTERCARE  1.  Uterine cramping is common after IUD placement.  You  May using heating pads, ibuprofen, and tylenol.  If it becomes very painful, you notice fever or chills, unusual bleeding, or foul smelling vaginal discharge please call the office. 2.  Irregular vaginal bleeding is common in the first few months after IUD placement but will often get better in the first six months.   3.  IUDs do not protect against STD such as HIV, genital warts, gonorrhea, chlamydia, herpes.  Please continue to protect yourself against these infections and contact the office if you think you may have contracted an infection. 4.  Checking for proper placement:  You may feel for your IUD strings as shown today by placing you fingers into your vagina.  Do not pull on the strings.  If your Mirena falls out, you can feel the hard plastic part of the IUD, or you can no longer feel the strings, please contact the office. 5.  Your Paraguard should be removed or replaced in 8-10 years. 6.  Please see package insert or [email or web address] for full patient information. 7.  Make follow-up appointment for 4 weeks if unable to locate strings at home.

## 2020-04-04 NOTE — Assessment & Plan Note (Addendum)
U-preg negative. Patient informed that I could not guarantee negative pregnancy even if pull out method attempted given last unprotected sex was on 10/27. She was informed of the risk of IUD placement today and possibility of termination of any unknown pregnancy. Patient repeated understanding of risks with placement today and agreed to continue with procedure.  Paraguard inserted without complication. Patient counseled regarding techniques for self-monitoring of IUD and given precautions. Patient notified of removal date and given card. Patient will follow-up in 4 weeks for string check if unable to locate herself.

## 2020-04-09 ENCOUNTER — Ambulatory Visit: Payer: Medicare Other | Admitting: Family Medicine

## 2020-04-11 ENCOUNTER — Telehealth: Payer: Self-pay

## 2020-04-11 NOTE — Telephone Encounter (Signed)
Patient calls nurse line reporting continued uterine bleeding post IUD insertion. Patient reports she goes through "maybe" 3 pads per day including at night. Patient reports she thought the bleeding would be done by now. String check apt made for 12/3, advised patient to make a diary of her bleeding this month to bring to apt. Patient advised to call to be evaluated sooner if her bleeding significantly picks up.

## 2020-04-15 ENCOUNTER — Other Ambulatory Visit: Payer: Self-pay

## 2020-04-15 ENCOUNTER — Ambulatory Visit (INDEPENDENT_AMBULATORY_CARE_PROVIDER_SITE_OTHER): Payer: Medicare Other | Admitting: Family Medicine

## 2020-04-15 DIAGNOSIS — M549 Dorsalgia, unspecified: Secondary | ICD-10-CM

## 2020-04-15 MED ORDER — ACETAMINOPHEN 500 MG PO TABS: 1000.0000 mg | ORAL_TABLET | Freq: Four times a day (QID) | ORAL | 0 refills | Status: DC

## 2020-04-15 MED ORDER — IBUPROFEN 600 MG PO TABS: 600.0000 mg | ORAL_TABLET | Freq: Three times a day (TID) | ORAL | 0 refills | Status: AC

## 2020-04-15 NOTE — Patient Instructions (Addendum)
Great to see you today! I am sorry that your back has been hurting. Ithink your back pain is due to a muscle spasm from [place]. I have prescribed tylenol and ibuprofen. Please take this daily. Keep stretching daily and doing gentle activity and follow up with your PCP if your pain does improve in the next 1-2 weeks. You can consider other medications or physical therapy if the pain does not improve.  Best wishes,  Dr Posey Pronto

## 2020-04-15 NOTE — Progress Notes (Signed)
° ° °  SUBJECTIVE:   CHIEF COMPLAINT / HPI:   Kimberly Browning is a 44 yr old female who presents today for back pain  Back pain  Pt endorses 3 week hx of lumbosacral back pain since starting zumba classes. She has recently tried to lose weight. She noticed the back pain while doing Zumba. 10/10 severity and non radiating. She has tried to do more exercise to help with the pain but this has worsened the pain. Denies fevers, IV drug use, urinary/fecal incontinence, trauma/falls, myalgias, weakness, hx of cancer, saddle anaesthesia, unintentional weight loss etc  PERTINENT  PMH / PSH: GERD, bipolar disorder, HLD, depression  OBJECTIVE:   BP 110/62    Pulse 82    Wt 184 lb 6.4 oz (83.6 kg)    SpO2 99%    BMI 32.66 kg/m     General: Alert, well appearing, no acute distress Cardio: Normal S1 and S2, RRR Pulm: CTAB, normal WOB Abdomen: Bowel sounds normal. Abdomen soft and non-tender.  Extremities: No peripheral edema.  Neuro: Cranial nerves grossly intact MSK: normal gait, no gross deformity, no tenderness on palpation of spine, tenderness on palpation of lumbar paraspinal muscles, 5/5 strength, normal sensation throughout LE   ASSESSMENT/PLAN:   Musculoskeletal back pain Likely musculoskeletal back pain. No red flags for back pain. Low suspicion for cauda equina/ostemyeltis/fracture etc. -Recommend gentle activity, avoiding bed rest - Stopping zumba until back pain improves -Tylenol 1000mg  QID -Naproxen 600mg  TID -Follow up with PCP if no improvement in 1-2 weeks -Consider muscle relaxant/PT at next visit if no improvement      Lattie Haw, MD PGY-2 [place]

## 2020-04-17 NOTE — Assessment & Plan Note (Signed)
Likely musculoskeletal back pain. No red flags for back pain. Low suspicion for cauda equina/ostemyeltis/fracture etc. -Recommend gentle activity, avoiding bed rest - Stopping zumba until back pain improves -Tylenol 1000mg  QID -Naproxen 600mg  TID -Follow up with PCP if no improvement in 1-2 weeks -Consider muscle relaxant/PT at next visit if no improvement

## 2020-04-23 ENCOUNTER — Ambulatory Visit
Admission: RE | Admit: 2020-04-23 | Discharge: 2020-04-23 | Disposition: A | Discharge status: Home / Self Care | Payer: Medicare Other | Source: Ambulatory Visit | Attending: Family Medicine | Admitting: Family Medicine

## 2020-04-23 ENCOUNTER — Encounter: Payer: Self-pay | Admitting: Family Medicine

## 2020-04-23 ENCOUNTER — Other Ambulatory Visit: Payer: Self-pay

## 2020-04-23 ENCOUNTER — Ambulatory Visit (INDEPENDENT_AMBULATORY_CARE_PROVIDER_SITE_OTHER): Payer: Medicare Other | Admitting: Family Medicine

## 2020-04-23 VITALS — BP 112/66 | HR 76 | Ht 63.0 in | Wt 185.0 lb

## 2020-04-23 DIAGNOSIS — M549 Dorsalgia, unspecified: Secondary | ICD-10-CM

## 2020-04-23 DIAGNOSIS — M533 Sacrococcygeal disorders, not elsewhere classified: Secondary | ICD-10-CM | POA: Diagnosis not present

## 2020-04-23 NOTE — Assessment & Plan Note (Signed)
-   noted on physical exam today - printed and given sheet of stretches and exercises for strengthening muscles around SI joint as part of HEP, discussed to call if she changes her mind and would like to go to PT - has follow-up with PCP in 2 weeks to reassess - will send referral to sports med as if pain persists she is interested in SI joint injections

## 2020-04-23 NOTE — Assessment & Plan Note (Signed)
-   discussed stretches and protection of back with lifting and exercise - while no red flags on history or exam today, due to continued presence and lordosis noted on exam will order X-ray lumbar spine - continue PRN ibuprofen/tylenol/heat, she did not want other medications for pain today - discussed importance of physical therapy, but transportation she feels would be an issue for her, discussed she could always call office back if she changes her mind.

## 2020-04-23 NOTE — Progress Notes (Signed)
° ° °  SUBJECTIVE:   CHIEF COMPLAINT / HPI:   Ms Krieger presents to discuss her continued lower back pain.  Was seen in clinic on 04/15/20 and pain suspected to be musculoskeletal, she started scheduled tylenol and ibuprofen without relief. Started doing Zumba classes online at home when all this pain started, no accident or trauma. Pain in her lower back, tried tylenol and ibuprofen, as well as heating pad. Hurts when sitting, also hurts with walking. And runs down into buttocks, not down legs or past her knee. No numbness or weakness in legs. No bowel or bladder incontinence. No fevers or chills. No previous back surgeries or fractures, never seen an orthopedic doctor, never been to physical therapy before. Notes transportation is difficult for her so she is not sure that she would be able to do PT.  PERTINENT  PMH / PSH: as above  OBJECTIVE:   BP 112/66    Pulse 76    Ht 5\' 3"  (1.6 m)    Wt 185 lb (83.9 kg)    SpO2 98%    BMI 32.77 kg/m   General: A&O, NAD HEENT: No sign of trauma, EOM grossly intact Respiratory: normal WOB GI: non-distended  Extremities: no peripheral edema. MSK: Spine visualized without erythema, induration, skin changes, spinal tenderness, or palpable step offs. Some lordosis noted of spinal curvature. Bilateral paraspinal muscles TTP along L3-L5, and bilateral SI joints tender to palpation. Negative SLR bilaterally. + FABER bilaterally with pain noted in SI joint. No hip pain, non-antalgic gait, full ROM of hips. Skin: no lesions/rashes visualized Psych: Appropriate mood and affect   ASSESSMENT/PLAN:   Musculoskeletal back pain - discussed stretches and protection of back with lifting and exercise - while no red flags on history or exam today, due to continued presence and lordosis noted on exam will order X-ray lumbar spine - continue PRN ibuprofen/tylenol/heat, she did not want other medications for pain today - discussed importance of physical therapy, but  transportation she feels would be an issue for her, discussed she could always call office back if she changes her mind.  Sacroiliac joint dysfunction of both sides - noted on physical exam today - printed and given sheet of stretches and exercises for strengthening muscles around SI joint as part of HEP, discussed to call if she changes her mind and would like to go to PT - has follow-up with PCP in 2 weeks to reassess - will send referral to sports med as if pain persists she is interested in SI joint injections     Lenoria Chime, MD [place]

## 2020-04-23 NOTE — Patient Instructions (Addendum)
It was wonderful to see you today.  Please bring ALL of your medications with you to every visit.   Today we talked about:  - You have musculoskeletal back pain as well as pain in your SI joints connecting your sacrum and pelvis - I gave you a page of exercises to do at home - We discussed physical therapy being likely the best treatment for your pain- if you change your mind you can call us and we can order - We ordered X-ray lumbar spine to assess for any bony abnormalities - We placed a referral to sports medicine for possible SI joint injections if the pain does not improve with these home exercises - We scheduled a follow-up appointment on 12/6 with Dr Tarry Kos   Thank you for choosing [facility].   Please call [phone] with any questions about today's appointment.  Please be sure to schedule follow up at the front  desk before you leave today.   Yehuda Savannah, MD  Family Medicine      Sacroiliac Joint Dysfunction  Sacroiliac joint dysfunction is a condition that causes inflammation on one or both sides of the sacroiliac (SI) joint. The SI joint connects the lower part of the spine (sacrum) with the two upper portions of the pelvis (ilium). This condition causes deep aching or burning pain in the low back. In some cases, the pain may also spread into one or both buttocks, hips, or thighs. What are the causes? This condition may be caused by:  Pregnancy. During pregnancy, extra stress is put on the SI joints because the pelvis widens.  Injury, such as: ? Injuries from car accidents. ? Sports-related injuries. ? Work-related injuries.  Having one leg that is shorter than the other.  Conditions that affect the joints, such as: ? Rheumatoid arthritis. ? Gout. ? Psoriatic arthritis. ? Joint infection (septic arthritis). Sometimes, the cause of SI joint dysfunction is not known. What are the signs or symptoms? Symptoms of this condition  include:  Aching or burning pain in the lower back. The pain may also spread to other areas, such as: ? Buttocks. ? Groin. ? Thighs.  Muscle spasms in or around the painful areas.  Increased pain when standing, walking, running, stair climbing, bending, or lifting. How is this diagnosed? This condition is diagnosed with a physical exam and medical history. During the exam, the health care provider may move one or both of your legs to different positions to check for pain. Various tests may be done to confirm the diagnosis, including:  Imaging tests to look for other causes of pain. These may include: ? MRI. ? CT scan. ? Bone scan.  Diagnostic injection. A numbing medicine is injected into the SI joint using a needle. If your pain is temporarily improved or stopped after the injection, this can indicate that SI joint dysfunction is the problem. How is this treated? Treatment depends on the cause and severity of your condition. Treatment options may include:  Ice or heat applied to the lower back area after an injury. This may help reduce pain and muscle spasms.  Medicines to relieve pain or inflammation or to relax the muscles.  Wearing a back brace (sacroiliac brace) to help support the joint while your back is healing.  Physical therapy to increase muscle strength around the joint and flexibility at the joint. This may also involve learning proper body positions and ways of moving to relieve stress on the joint.  Direct manipulation of  the SI joint.  Injections of steroid medicine into the joint to reduce pain and swelling.  Radiofrequency ablation to burn away nerves that are carrying pain messages from the joint.  Use of a device that provides electrical stimulation to help reduce pain at the joint.  Surgery to put in screws and plates that limit or prevent joint motion. This is rare. Follow these instructions at home: Medicines  Take over-the-counter and prescription  medicines only as told by your health care provider.  Do not drive or use heavy machinery while taking prescription pain medicine.  If you are taking prescription pain medicine, take actions to prevent or treat constipation. Your health care provider may recommend that you: ? Drink enough fluid to keep your urine pale yellow. ? Eat foods that are high in fiber, such as fresh fruits and vegetables, whole grains, and beans. ? Limit foods that are high in fat and processed sugars, such as fried or sweet foods. ? Take an over-the-counter or prescription medicine for constipation. If you have a brace:  Wear the brace as told by your health care provider. Remove it only as told by your health care provider.  Keep the brace clean.  If the brace is not waterproof: ? Do not let it get wet. ? Cover it with a watertight covering when you take a bath or a shower. Managing pain, stiffness, and swelling      Icing can help with pain and swelling. Heat may help with muscle tension or spasms. Ask your health care provider if you should use ice or heat.  If directed, put ice on the affected area: ? If you have a removable brace, remove it as told by your health care provider. ? Put ice in a plastic bag. ? Place a towel between your skin and the bag. ? Leave the ice on for 20 minutes, 2-3 times a day.  If directed, apply heat to the affected area. Use the heat source that your health care provider recommends, such as a moist heat pack or a heating pad. ? Place a towel between your skin and the heat source. ? Leave the heat on for 20-30 minutes. ? Remove the heat if your skin turns bright red. This is especially important if you are unable to feel pain, heat, or cold. You may have a greater risk of getting burned. General instructions  Rest as needed. Ask your health care provider what activities are safe for you.  Return to your normal activities as told by your health care provider.  Exercise  as directed by your health care provider or physical therapist.  Do not use any products that contain nicotine or tobacco, such as cigarettes and e-cigarettes. These can delay bone healing. If you need help quitting, ask your health care provider.  Keep all follow-up visits as told by your health care provider. This is important. Contact a health care provider if:  Your pain is not controlled with medicine.  You have a fever.  Your pain is getting worse. Get help right away if:  You have weakness, numbness, or tingling in your legs or feet.  You lose control of your bladder or bowel. Summary  Sacroiliac joint dysfunction is a condition that causes inflammation on one or both sides of the sacroiliac (SI) joint.  This condition causes deep aching or burning pain in the low back. In some cases, the pain may also spread into one or both buttocks, hips, or thighs.  Treatment depends  on the cause and severity of your condition. It may include medicines to reduce pain and swelling or to relax muscles. This information is not intended to replace advice given to you by your health care provider. Make sure you discuss any questions you have with your health care provider. Document Revised: 01/18/2018 Document Reviewed: 07/04/2017 Elsevier Patient Education  2020 [employer].

## 2020-04-30 ENCOUNTER — Encounter: Payer: Self-pay | Admitting: Family Medicine

## 2020-04-30 ENCOUNTER — Ambulatory Visit (INDEPENDENT_AMBULATORY_CARE_PROVIDER_SITE_OTHER): Payer: Medicare Other | Admitting: Family Medicine

## 2020-04-30 ENCOUNTER — Other Ambulatory Visit: Payer: Self-pay

## 2020-04-30 VITALS — BP 122/82 | Ht 64.0 in

## 2020-04-30 DIAGNOSIS — M549 Dorsalgia, unspecified: Secondary | ICD-10-CM

## 2020-04-30 MED ORDER — MELOXICAM 15 MG PO TABS: 15.0000 mg | ORAL_TABLET | Freq: Every day | ORAL | 0 refills | Status: DC

## 2020-04-30 NOTE — Patient Instructions (Signed)
Thank you for coming in to see [place] today! Please see below to review our plan for today's visit:   1.   Please start doing gentle stretching exercises, along with the core exercises that you were shown today. 2.   You may take meloxicam 15 mg every morning for the next 1 to 2 weeks, then as needed. 3.   Please start walking daily, starting with 5 minutes and building up by 1 to 2 minutes every day.   Please call the clinic at [phone] if your symptoms worsen or you have any concerns. It was our pleasure to serve you.       Dr. Dagoberto Ligas [facility]

## 2020-04-30 NOTE — Progress Notes (Signed)
   PCP: Danna Hefty, DO  Subjective:   HPI: Patient is a 44 y.o. female here for evaluation of low back pain.  She reports that she is try to get back in shape, started doing a Zumba program about a month ago and shortly afterwards started having low back pain.  She did not have any sudden incident or injury while doing Zumba, rather it came on gradually.  She notices the pain most significantly about halfway through her Zumba class which she is doing at home on a DVD.  It has been progressively worsening, she describes as a throbbing pain with occasional shooting pains down her left leg.  It is aggravated by Zumba and flexion of her lumbar spine.  She has tried ibuprofen, Tylenol, heat, and an exercise program that she was given by her PCP, all of which she reports has not been helpful.  Patient was most recently seen on 11/17 at [facility] family medicine, at that time was diagnosed with muscle skeletal low back pain and SI joint dysfunction.  X-rays were obtained and did not show any acute osseous abnormalities or significant DDD.  She was given the aforementioned exercise program which she has not noticed much benefit from thus far.  Review of Systems:  Per HPI.   [place], medications and smoking status reviewed.      Objective:  Physical Exam:  [facility] Exercise 04/30/2020  Frequency of aerobic exercise (# of days/week) 0  Average time in minutes 0  Frequency of strengthening activities (# of days/week) 0     Gen: awake, alert, NAD, comfortable in exam room Pulm: breathing unlabored  Lumbar spine:  Inspection: No evidence of erythema, ecchymosis, swelling edema.  Palpation: No midline spinal tenderness.  She has diffuse tenderness along the paraspinal muscles bilaterally.  Normal mobility of bilateral SI joints. ROM: Intact to forward flexion, extension, rotation, and bending.  She does have pain especially with flexion. Special tests: No pain with facet  loading. Neg straight leg raise bilaterally.   Assessment & Plan:  1.  Low back pain  Patient with signs symptoms consistent with musculoskeletal low back pain.  Differential includes lumbar strain, discogenic pain, facet arthropathy.  Do not suspect SI joint dysfunction at this time given normal mobility and more diffuse pain.  I am suspicious for a mild disc bulge given the occasional radicular symptoms in nature of her pain.  Ideally I would have her go to formal physical therapy, however she does not have transportation to do this so we will do HEP for now.  Plan: -Meloxicam 15 mg daily -Isometric core exercises and McKenzie flexion exercises -Start walking program beginning with 5 minutes/day and building up -Follow-up as needed if pain not improving over the next month to 2 months.  Dagoberto Ligas, MD [facility] Fellow 04/30/2020 11:46 AM   I observed and examined the patient with the [facility] resident and agree with assessment and plan.  Note reviewed and modified by me. Ila Mcgill, MD

## 2020-05-08 ENCOUNTER — Other Ambulatory Visit: Payer: Self-pay | Admitting: *Deleted

## 2020-05-09 ENCOUNTER — Ambulatory Visit: Payer: Medicare Other | Admitting: Family Medicine

## 2020-05-09 ENCOUNTER — Encounter (INDEPENDENT_AMBULATORY_CARE_PROVIDER_SITE_OTHER): Payer: Self-pay

## 2020-05-09 MED ORDER — RABEPRAZOLE SODIUM 20 MG PO TBEC
20.0000 mg | DELAYED_RELEASE_TABLET | Freq: Every day | ORAL | 1 refills | Status: DC
Start: 2020-05-09 — End: 2020-08-07

## 2020-05-12 ENCOUNTER — Ambulatory Visit: Payer: Medicare Other | Admitting: Family Medicine

## 2020-05-19 ENCOUNTER — Encounter: Payer: Self-pay | Admitting: Family Medicine

## 2020-05-19 ENCOUNTER — Other Ambulatory Visit: Payer: Self-pay

## 2020-05-19 ENCOUNTER — Ambulatory Visit (INDEPENDENT_AMBULATORY_CARE_PROVIDER_SITE_OTHER): Payer: Medicare Other | Admitting: Family Medicine

## 2020-05-19 DIAGNOSIS — M549 Dorsalgia, unspecified: Secondary | ICD-10-CM

## 2020-05-19 NOTE — Progress Notes (Signed)
   Subjective:   Patient ID: Kimberly Browning    DOB: December 05, 1975, 44 y.o. female   MRN: [id number]  Kimberly Browning is a 44 y.o. female with a history of GERD, hypertonicity of bladder, bipolar disorder, depression/anxiety, herpes labialis, history of molar pregnancy, hyperlipidemia, insomnia, memory loss, tobacco user, overweight, urinary incontinence here for follow-up  [facility] Pain Follow up: Patient returns today to inquire if it is okay to restart physical activity.  She notes that her back pain has resolved.  She does endorse occasional intermittent pain in her back which improves with rest.  She would like to restart Zumba but wanted to make sure it would be safe to do so.   Review of Systems:  Per HPI.   Objective:   BP 110/70   Pulse 87   Ht 5\' 4"  (1.626 m)   Wt 180 lb 6 oz (81.8 kg)   LMP 05/18/2020   SpO2 98%   BMI 30.96 kg/m  Vitals and nursing note reviewed.  General: pleasant young female, sitting comfortably in exam chair, well nourished, well developed, in no acute distress with non-toxic appearance Resp: breathing comfortably on room air, speaking in full sentences Skin: warm, dry MSK: no midline bony tenderness along spine or paraspinal muscle pain, normal ROM throughout without pain ROM, gait normal Neuro: Alert and oriented, speech normal  Assessment & Plan:   Musculoskeletal back pain Resolved with only intermittent pain at times. I do feel it is safe for patient to resume physical activity as tolerated. Recommended to avoid activities that exacerbate pain. She may trial back brace to protect her back during exercise. Recommended slow and controlled movements. Heat/Ice PRN, OTC Tylenol/Ibuprofen PRN. Encouraged weight loss as this helps improve back pain.   Mina Marble, DO PGY-3, [facility] 05/19/2020 11:46 AM

## 2020-05-19 NOTE — Patient Instructions (Signed)
I am glad your back pain is improved. You may start back with physical activity as tolerated. I would slowly build up.  Slow and controlled movements. Avoid activities that cause you pain.  Use heating pad and ice as needed You can treat acute pain with Tylenol / Ibuprofen as needed

## 2020-05-19 NOTE — Assessment & Plan Note (Signed)
Resolved with only intermittent pain at times. I do feel it is safe for patient to resume physical activity as tolerated. Recommended to avoid activities that exacerbate pain. She may trial back brace to protect her back during exercise. Recommended slow and controlled movements. Heat/Ice PRN, OTC Tylenol/Ibuprofen PRN. Encouraged weight loss as this helps improve back pain.

## 2020-05-21 ENCOUNTER — Ambulatory Visit: Payer: Medicare Other | Admitting: Family Medicine

## 2020-05-23 ENCOUNTER — Ambulatory Visit: Payer: Medicare Other | Admitting: Family Medicine

## 2020-06-19 ENCOUNTER — Ambulatory Visit (INDEPENDENT_AMBULATORY_CARE_PROVIDER_SITE_OTHER): Payer: Medicare Other | Admitting: Family Medicine

## 2020-06-19 ENCOUNTER — Encounter: Payer: Self-pay | Admitting: Family Medicine

## 2020-06-19 ENCOUNTER — Other Ambulatory Visit: Payer: Self-pay

## 2020-06-19 VITALS — BP 128/78 | HR 78 | Ht 64.0 in | Wt 180.6 lb

## 2020-06-19 DIAGNOSIS — E8881 Metabolic syndrome: Secondary | ICD-10-CM | POA: Diagnosis present

## 2020-06-19 DIAGNOSIS — Z87891 Personal history of nicotine dependence: Secondary | ICD-10-CM

## 2020-06-19 DIAGNOSIS — E669 Obesity, unspecified: Secondary | ICD-10-CM | POA: Diagnosis not present

## 2020-06-19 NOTE — Progress Notes (Signed)
   Subjective:   Patient ID: Kimberly Browning    DOB: [birth year], 45 y.o. female   MRN: [id number]  Kimberly Browning is a 45 y.o. female with a history of GERD, onychomycosis, hypertonicity of bladder, bipolar disorder, depression/anxiety, herpes labialis, h/o molar pregnancy, HLD, insomnia, IUD contraception, memory loss, MSK pain, obesity, h/o tobacco user here for weight loss management.  Desire for Weight Loss: Patient desires weight loss medication. She notes that she cant afford the weight management clinic and has difficulty getting to the appointment. She does feel like her depression isn't helping, but feels like her sytmpoms are due to her family which she is trying to step back from. Her relationship with her daughter is worsening. She has tried Metformin, Liraglutide, Pheniramine, Phentermine. She notes that she has cut out a lot of foods from her diet. She rarely eats. Doing 30 minutes of yoga a day.  Review of Systems:  Per HPI.   Objective:   BP 128/78   Pulse 78   Ht 5\' 4"  (1.626 m)   Wt 180 lb 9.6 oz (81.9 kg)   LMP 05/22/2020 (Exact Date)   SpO2 99%   BMI 31.00 kg/m  Vitals and nursing note reviewed.  General: pleasant middle aged woman, sitting comfortably in exam chair, well nourished, well developed, in no acute distress with non-toxic appearance Resp: breathing comfortably on room air, speaking in full sentences MSK:  gait normal Neuro: Alert and oriented, speech normal  Assessment & Plan:   Metabolic syndrome Obesity, dyslipidemia, with waist circumference of 114 cm.  Has attempted multiple different pharmacologic options, nutrition services, and weight management clinic.  Given Metabolic syndrome, would benefit from weight loss to improve long term cardiovascular health.  Trial Liraglutide starting at 0.6mg  daily with step wise increase Plan to follow up in 3 weeks to evaluate for tolerance, sooner if side effects or intolerance Assess weight loss goals at 12  weeks with goal to achieve maximum tolerable dose   Obesity (BMI 30.0-34.9) Plan as above  No orders of the defined types were placed in this encounter.  Meds ordered this encounter  Medications  . liraglutide (VICTOZA) 18 MG/3ML SOPN    Sig: Inject 0.6 mg once daily for 1 week,then increase to 1.2 mg once daily for 1 week, then increase to 1.8mg  for 1 week.    Dispense:  9 mL    Refill:  0    Mina Marble, DO PGY-3, [facility] 06/20/2020 2:25 PM

## 2020-06-20 ENCOUNTER — Encounter: Payer: Self-pay | Admitting: Family Medicine

## 2020-06-20 DIAGNOSIS — E8881 Metabolic syndrome: Secondary | ICD-10-CM | POA: Insufficient documentation

## 2020-06-20 MED ORDER — LIRAGLUTIDE 18 MG/3ML ~~LOC~~ SOPN: PEN_INJECTOR | SUBCUTANEOUS | 0 refills | Status: DC

## 2020-06-20 NOTE — Assessment & Plan Note (Addendum)
Obesity, dyslipidemia, with waist circumference of 114 cm.  Has attempted multiple different pharmacologic options, nutrition services, and weight management clinic.  Given Metabolic syndrome, would benefit from weight loss to improve long term cardiovascular health.  Trial Liraglutide starting at 0.6mg  daily with step wise increase Plan to follow up in 3 weeks to evaluate for tolerance, sooner if side effects or intolerance Assess weight loss goals at 12 weeks with goal to achieve maximum tolerable dose

## 2020-06-20 NOTE — Assessment & Plan Note (Signed)
Plan as above.  

## 2020-06-24 ENCOUNTER — Institutional Professional Consult (permissible substitution): Payer: Medicare Other | Admitting: Plastic Surgery

## 2020-07-01 NOTE — Progress Notes (Addendum)
   Subjective:   Patient ID: Kimberly Browning    DOB: [birth year], 45 y.o. female   MRN: [id number]  Kimberly Browning is a 45 y.o. female with a history of GERD, onychomycosis, hypertonicity of bladder, bipolar disorder, depression/anxiety, herpes labialis, history of molar pregnancy, history of tobacco abuse, hyperlipidemia, insomnia, history of memory loss, metabolic syndrome, obesity, mixed urinary incontinence here for weight loss follow-up  Obesity:  Patient presents today for follow-up for continued weight loss discussion.  She was seen last on 06/20/2020.  It was decided to start Liraglutide daily injections. Patient was very concerned as she feared that she had diabetes because she was starting this medication. She also had questions about the "needles" for the injections.  Review of Systems:  Per HPI.   Objective:   BP 125/80   Pulse 78   Ht 5\' 4"  (1.626 m)   Wt 180 lb (81.6 kg)   SpO2 98%   BMI 30.90 kg/m  Vitals and nursing note reviewed.  General: pleasant middle aged female, sitting comfortably in exam chair, well nourished, well developed, in no acute distress with non-toxic appearance Resp: breathing comfortably on room air, speaking in full sentences [id number] normal Neuro: Alert and oriented, speech normal  Assessment & Plan:   Obesity (BMI 30.0-34.9) Obesity with waist circumference of 114cm.  Has attempted multiple different pharmacologic options, nutrition services, and weight management clinic.  Discuss trial of liraglutide starting at 0.6 mg daily with stepwise increase >50% of the visit spent on demonstration on how to use Liraglutide and potential side effects Plan to follow-up in 3 weeks to evaluate for tolerance, sooner if side effects or intolerance Plan to assess weight loss goal at 12 weeks with goal to achieve maximum tolerable dose Encouraged continued exercise therapy Discussed healthy eating, portion control, and limiting carbohydrates.  Patient voiced  understanding and agreement with plan.  Educated patient that she does not have a diagnosis of diabetes. A1C 5.6, random glucose 95. This medication is being used for weight loss, not diabetes management.  Orders Placed This Encounter  Procedures  . POCT glycosylated hemoglobin (Hb A1C)  . Glucose (CBG)   Meds ordered this encounter  Medications  . Insulin Pen Needle (BD PEN NEEDLE NANO U/F) 32G X 4 MM MISC    Sig: 1 application by Does not apply route daily.    Dispense:  200 each    Refill:  2   Follow up on 07/16/20 for annual exam. Plan to obtain updated BMP and lipid panel at that time.   Mina Marble, DO PGY-3, [facility] 07/02/2020 7:29 PM

## 2020-07-02 ENCOUNTER — Ambulatory Visit (INDEPENDENT_AMBULATORY_CARE_PROVIDER_SITE_OTHER): Payer: Medicare Other | Admitting: Family Medicine

## 2020-07-02 ENCOUNTER — Other Ambulatory Visit: Payer: Self-pay

## 2020-07-02 VITALS — BP 125/80 | HR 78 | Ht 64.0 in | Wt 180.0 lb

## 2020-07-02 DIAGNOSIS — E8881 Metabolic syndrome: Secondary | ICD-10-CM

## 2020-07-02 DIAGNOSIS — R7309 Other abnormal glucose: Secondary | ICD-10-CM | POA: Diagnosis not present

## 2020-07-02 DIAGNOSIS — E669 Obesity, unspecified: Secondary | ICD-10-CM | POA: Diagnosis not present

## 2020-07-02 LAB — POCT GLYCOSYLATED HEMOGLOBIN (HGB A1C): Hemoglobin A1C: 5.6 % (ref 4.0–5.6)

## 2020-07-02 LAB — GLUCOSE, POCT (MANUAL RESULT ENTRY): POC Glucose: 95 mg/dl (ref 70–99)

## 2020-07-02 MED ORDER — BD PEN NEEDLE NANO U/F 32G X 4 MM MISC: 1.0000 "application " | Freq: Every day | 2 refills | Status: DC

## 2020-07-02 NOTE — Patient Instructions (Signed)
It was a pleasure to see you today!  Thank you for choosing [facility] for your primary care.   Our plans for today were:  Start Liraglutide 0.6mg  daily. Be sure to clean the skin with alcohol prior. Change skin sites to avoid irritation.   Follow up 08/03/20 to see how things are going.   You may experience nausea if you eat too large of a meal at one time. Some people experience nausea the first few days of using the medicine, but things improve with time.  Follow up on 07/15/20 for your annual exam.   Best Wishes,   Mina Marble, DO

## 2020-07-02 NOTE — Assessment & Plan Note (Deleted)
Discuss trial of liraglutide starting at 0.6 mg daily with stepwise increase >50% of the visit spent on demonstration on how to use Liraglutide and potential side effects Plan to follow-up in 3 weeks to evaluate for tolerance, sooner if side effects or intolerance Plan to assess weight loss goal at 12 weeks with goal to achieve maximum tolerable dose Encouraged continued exercise therapy Discussed healthy eating, portion control, and limiting carbohydrates.  Patient voiced understanding and agreement with plan.  Educated patient that she does not have a diagnosis of diabetes. A1C 5.6, random glucose 95. This medication is being used for weight loss, not diabetes management.

## 2020-07-02 NOTE — Assessment & Plan Note (Signed)
Obesity with waist circumference of 114cm.  Has attempted multiple different pharmacologic options, nutrition services, and weight management clinic.  Discuss trial of liraglutide starting at 0.6 mg daily with stepwise increase >50% of the visit spent on demonstration on how to use Liraglutide and potential side effects Plan to follow-up in 3 weeks to evaluate for tolerance, sooner if side effects or intolerance Plan to assess weight loss goal at 12 weeks with goal to achieve maximum tolerable dose Encouraged continued exercise therapy Discussed healthy eating, portion control, and limiting carbohydrates.  Patient voiced understanding and agreement with plan.  Educated patient that she does not have a diagnosis of diabetes. A1C 5.6, random glucose 95. This medication is being used for weight loss, not diabetes management.

## 2020-07-15 NOTE — Progress Notes (Signed)
SUBJECTIVE:   Chief compliant/HPI: annual examination  Kimberly Browning is a 45 y.o. who presents today for an annual exam.   PMH: GERD, onychomycosis of left toe, hypertonicity of bladder, depression/anxiety, herpes labialis, h/o molar pregnancy, h/o tobacco use, HLD, insomnia, IUD contraception, memory loss, obesity, mixed urinary incontinence   Social History: LMP: 06/23/19 Contraception: IUD Alcohol: occasional Tobacco: none Illicit Drugs: none Safe at home: none Depression/Suicidality: none  Health Maintenance: There are no preventive care reminders to display for this patient. No known family history of breast or colon cancer to suggest early screening. Patient is adopted.  Review of systems: See HPI    OBJECTIVE:   BP 122/82   Pulse 85   Ht '5\' 4"'  (1.626 m)   Wt 174 lb (78.9 kg)   LMP 06/22/2020   SpO2 98%   BMI 29.87 kg/m   General: pleasant middle aged woman, sitting comfortably in exam chair, well nourished, well developed, in no acute distress with non-toxic appearance HEENT: normocephalic, atraumatic, moist mucous membranes, oropharynx clear without erythema or exudate, TM normal bilaterally  CV: regular rate and rhythm without murmurs, rubs, or gallops, no lower extremity edema, 2+ radial and pedal pulses bilaterally Lungs: clear to auscultation bilaterally with normal work of breathing on room air, speaking in full sentences Abdomen: soft, non-tender, non-distended, normoactive bowel sounds Skin: warm, dry Extremities: warm and well perfused MSK:  gait normal Neuro: Alert and oriented, speech normal  ASSESSMENT/PLAN:   Annual Physical Exam: Patient here today for annual physical exam.  PMH, surgical history, and social history were reviewed. The following concerns below were discussed.   Obesity (BMI 30.0-34.9) Chronic. Currently on Liraglutide at 0.24m weekly with plan to increase to 1.25mthis week, then 1.42m66mfter 1 week. Has currently lost 6lbs  since starting. Follow up scheduled for 07/24/20.  Unlikely to continue to have insurance cover cost of medication given she does not qualify for metabolic syndrome based on labs. Spoke to Dr. KovValentina Luckso noted ability to provide treatment through samples. Patient was very excited about this as her depression and mood is strongly associated with her weight.   Hyperlipidemia Chronic. The 10-year ASCVD risk score (GoMikey Bussing Jr.Brooke Bonitoet al., 2013) is: 3%. Currently working on life style modifications.  Elevated serum creatinine Chronic and stable. GFR >60.  Continue to monitor.  Nonsmoker. BP well controlled.    Annual Examination  See AVS for age appropriate recommendations.   PHQ score 8, reviewed and discussed.  Blood pressure reviewed and at goal.  Asked about intimate partner violence and resources given as appropriate  The patient currently uses IUD for contraception. Folate recommended as appropriate, minimum of 400 mcg per day.   Considered the following items based upon USPSTF recommendations: Diabetes screening: Last A1C 5.6 in Jan 2022 Screening for elevated cholesterol: ordered HIV testing: Negative in 2010. Not at high risk.  Hepatitis C: ordered Hepatitis B: Negative in 2010. Not at high risk. Syphilis if at high risk: Not at high risk. declined GC/CT not at high risk and not ordered. Reviewed risk factors for latent tuberculosis and not indicated Reviewed risk factors for osteoporosis. Using FRAX tool estimated risk of major osteoporotic fracture of  1.9%, early screening not ordered   Discussed family history, BRCA testing not indicated. Cervical cancer screening: prior Pap reviewed, repeat due in 10/31/2021 Breast cancer screening: discussed potential benefits, risks including overdiagnosis and biopsy, elected to wait until age [age]lorectal cancer screening: not applicable given  age.    Follow up in 1 year or sooner if indicated.    Marble

## 2020-07-16 ENCOUNTER — Encounter: Payer: Self-pay | Admitting: Family Medicine

## 2020-07-16 ENCOUNTER — Other Ambulatory Visit: Payer: Self-pay

## 2020-07-16 ENCOUNTER — Ambulatory Visit (INDEPENDENT_AMBULATORY_CARE_PROVIDER_SITE_OTHER): Payer: Medicare Other | Admitting: Family Medicine

## 2020-07-16 VITALS — BP 122/82 | HR 85 | Ht 64.0 in | Wt 174.0 lb

## 2020-07-16 DIAGNOSIS — Z1159 Encounter for screening for other viral diseases: Secondary | ICD-10-CM

## 2020-07-16 DIAGNOSIS — Z0000 Encounter for general adult medical examination without abnormal findings: Secondary | ICD-10-CM | POA: Diagnosis not present

## 2020-07-16 DIAGNOSIS — E669 Obesity, unspecified: Secondary | ICD-10-CM

## 2020-07-16 DIAGNOSIS — E782 Mixed hyperlipidemia: Secondary | ICD-10-CM

## 2020-07-16 DIAGNOSIS — R7989 Other specified abnormal findings of blood chemistry: Secondary | ICD-10-CM

## 2020-07-16 NOTE — Patient Instructions (Addendum)
It was a pleasure to see you today!  Thank you for choosing [facility] for your primary care.   Our plans for today were:  I am getting labs today to monitor  For your next dose of the Liraglutide, you will increase to 1.2mg . After 1 week you will increase to 1.8mg .   Due for papsmear in May 2023  I will see you again on 07/24/20    You should return to our clinic in 1 year for annual exam.  Best Wishes,   Mina Marble, DO

## 2020-07-17 ENCOUNTER — Encounter: Payer: Self-pay | Admitting: Family Medicine

## 2020-07-17 DIAGNOSIS — R7989 Other specified abnormal findings of blood chemistry: Secondary | ICD-10-CM | POA: Insufficient documentation

## 2020-07-17 LAB — BASIC METABOLIC PANEL
BUN/Creatinine Ratio: 6 — ABNORMAL LOW (ref 9–23)
BUN: 7 mg/dL (ref 6–24)
CO2: 19 mmol/L — ABNORMAL LOW (ref 20–29)
Calcium: 9.7 mg/dL (ref 8.7–10.2)
Chloride: 104 mmol/L (ref 96–106)
Creatinine, Ser: 1.11 mg/dL — ABNORMAL HIGH (ref 0.57–1.00)
GFR calc Af Amer: 70 mL/min/{1.73_m2} (ref >59)
GFR calc non Af Amer: 61 mL/min/{1.73_m2} (ref >59)
Glucose: 80 mg/dL (ref 65–99)
Potassium: 4.1 mmol/L (ref 3.5–5.2)
Sodium: 141 mmol/L (ref 134–144)

## 2020-07-17 LAB — LIPID PANEL
Chol/HDL Ratio: 4.1 ratio (ref 0.0–4.4)
Cholesterol, Total: 228 mg/dL — ABNORMAL HIGH (ref 100–199)
HDL: 55 mg/dL (ref >39)
LDL Chol Calc (NIH): 150 mg/dL — ABNORMAL HIGH (ref 0–99)
Triglycerides: 130 mg/dL (ref 0–149)
VLDL Cholesterol Cal: 23 mg/dL (ref 5–40)

## 2020-07-17 LAB — HEPATITIS C ANTIBODY: Hep C Virus Ab: <0.1 s/co ratio (ref 0.0–0.9)

## 2020-07-17 NOTE — Assessment & Plan Note (Signed)
Chronic. The 10-year ASCVD risk score Mikey Bussing DC Brooke Bonito., et al., 2013) is: 3%. Currently working on life style modifications.

## 2020-07-17 NOTE — Assessment & Plan Note (Signed)
Chronic. Currently on Liraglutide at 0.6mg  weekly with plan to increase to 1.2mg  this week, then 1.8mg  after 1 week. Has currently lost 6lbs since starting. Follow up scheduled for 07/24/20.  Unlikely to continue to have insurance cover cost of medication given she does not qualify for metabolic syndrome based on labs. Spoke to Dr. Valentina Lucks who noted ability to provide treatment through samples. Patient was very excited about this as her depression and mood is strongly associated with her weight.

## 2020-07-17 NOTE — Assessment & Plan Note (Signed)
Chronic and stable. GFR >60.  Continue to monitor.  Nonsmoker. BP well controlled.

## 2020-07-22 ENCOUNTER — Telehealth: Payer: Self-pay

## 2020-07-22 NOTE — Telephone Encounter (Signed)
Patient calls nurse line requesting resources for family counseling/therapy. Patient reports that she has been having some issues with her daughter that she would like to talk with therapist about, in a virtual setting.   Patient denies having SI/HI. Provided patient with local hotline for [facility] center. Patient reports having negative experience at this office and would like to try another office.   Messaged Dr. Hartford Poli regarding patient. Due to patient having Medicare, patient would need to contact insurance provider for list of in- network providers.   Informed patient. Patient verbalizes understanding of next steps. Patient has follow up appointment with Dr. Tarry Kos on 2/17.  Talbot Grumbling, RN

## 2020-07-23 NOTE — Progress Notes (Signed)
   Subjective:   Patient ID: Kimberly Browning    DOB: 04-20-1976, 45 y.o. female   MRN: [id number]  Kimberly Browning is a 45 y.o. female with a history of GERD, bipolar disorder, depression/anxiety, elevated creatinine, herpes labialis, h/o tobacco abuse, HLD, insomnia, obesity  here for weight loss follow up   Obesity  Weight loss follow up:  Patient follows up today for continued management of weight loss. She is currently on Liraglutide 1.2mg  QD x 1 week with plan to increase to 1.8mg  next week. She notes she is feeling good. She denies any nausea or upset stomach.  Labs collected include: A1C 5.6, Triglycerides 130, HDL 55 Starting Weight: 180lbs  Last weight: 174lbs (07/16/20) Weight Today: 171.8lbs  Review of Systems:  Per HPI.   Objective:   BP 102/70   Pulse (!) 103   Wt 171 lb 12.8 oz (77.9 kg)   LMP 06/22/2020   SpO2 96%   BMI 29.49 kg/m  Vitals and nursing note reviewed.  General: pleasant middle aged woman, sitting comfortably in exam chair, well nourished, well developed, in no acute distress with non-toxic appearance Resp: breathing comfortably on room air, speaking in full sentences MSK:  gait normal Neuro: Alert and oriented, speech normal  Assessment & Plan:   Obesity (BMI 30.0-34.9) Chronic, improving. 5% weight loss since starting Liraglutide daily.  - discussed titration plan with patient  07/25/20, increase Victoza to 1.8mg  for 1 week.   08/01/20, increase Victoza to 2.4mg  for 1 week  08/08/20, increase Victoza to 3.0mg   - Will continue Liraglutide. If insurance does not cover cost going further, plan to follow up with Dr. Valentina Lucks to discuss transitioning to [facility] - discussed this with patient - follow up in 1 month for continued monitoring. Call sooner if develops any unwanted symptoms  No orders of the defined types were placed in this encounter.  No orders of the defined types were placed in this encounter.   Mina Marble, DO PGY-3, [facility] 07/26/2020 10:05 AM

## 2020-07-24 ENCOUNTER — Encounter: Payer: Self-pay | Admitting: Family Medicine

## 2020-07-24 ENCOUNTER — Ambulatory Visit (INDEPENDENT_AMBULATORY_CARE_PROVIDER_SITE_OTHER): Payer: Medicare Other | Admitting: Family Medicine

## 2020-07-24 ENCOUNTER — Other Ambulatory Visit: Payer: Self-pay

## 2020-07-24 VITALS — BP 102/70 | HR 103 | Wt 171.8 lb

## 2020-07-24 DIAGNOSIS — Z7689 Persons encountering health services in other specified circumstances: Secondary | ICD-10-CM

## 2020-07-24 DIAGNOSIS — E669 Obesity, unspecified: Secondary | ICD-10-CM

## 2020-07-24 NOTE — Patient Instructions (Addendum)
It was a pleasure to see you today!  Thank you for choosing [facility] for your primary care.   Our plans for today were:  On Friday 07/25/20, increase Victoza to 1.8mg  for 1 week.   Next Friday, 08/01/20, increase Victoza to 2.4mg  for 1 week  The following Friday, 08/08/20, increase Victoza to 3.0mg     When you are reaching halfway through your last pen, call and request refill from your pharmacy. If too expensive that is when you need to call the clinic to schedule appointment with Dr. Valentina Lucks (pharmacist) too allow you to transition to the alternative medicine.  Follow up on 08/27/20 for 1 month follow up.  Best Wishes,   Mina Marble, DO

## 2020-07-26 NOTE — Assessment & Plan Note (Signed)
Chronic, improving. 5% weight loss since starting Liraglutide daily.  - discussed titration plan with patient  07/25/20, increase Victoza to 1.8mg  for 1 week.   08/01/20, increase Victoza to 2.4mg  for 1 week  08/08/20, increase Victoza to 3.0mg   - Will continue Liraglutide. If insurance does not cover cost going further, plan to follow up with Dr. Valentina Lucks to discuss transitioning to [facility] - discussed this with patient - follow up in 1 month for continued monitoring. Call sooner if develops any unwanted symptoms

## 2020-07-28 ENCOUNTER — Telehealth: Payer: Self-pay

## 2020-07-28 NOTE — Telephone Encounter (Signed)
Patient calls nurse line requesting to speak with PCP about Victoza. Patients reports she has some questions about the medication and drinking alcohol. Patient wants to make sure the combination is safe or if she should skip a dose so she can drink that day. Patient states, "I do not drink that much, but I do like to indulge more on the weekends." I advised patient to not skip doses of her medication and will have PCP call to discuss side effects of Victoza and drinking alcohol.

## 2020-07-29 NOTE — Telephone Encounter (Signed)
Unable to reach patient personally. Did leave HIPAA compliant voicemail informing her of no known contraindications to occasional moderate alcohol consumption with Victoza. Recommended patient to call clinic if any further questions or concerns.

## 2020-07-30 ENCOUNTER — Telehealth: Payer: Self-pay

## 2020-07-30 NOTE — Telephone Encounter (Signed)
Patient calls nurse line regarding side effects to Victoza. Patient reports that since increasing to 1.8 mg she has been experiencing GI upset. Reports nausea and vomiting and "feeling like food is not going down like normal"  Spoke with Dr. Tarry Kos regarding patient. Advised patient decreasing to 1.2 mg and follow up with PCP at scheduled appointment on 3/23.   Informed patient. Patient verbalizes understanding.   Talbot Grumbling, RN

## 2020-08-01 ENCOUNTER — Ambulatory Visit (HOSPITAL_COMMUNITY)
Admission: EM | Admit: 2020-08-01 | Discharge: 2020-08-01 | Disposition: A | Discharge status: Still In Hospital | Payer: Medicare Other

## 2020-08-01 ENCOUNTER — Ambulatory Visit (HOSPITAL_COMMUNITY)
Admission: EM | Admit: 2020-08-01 | Discharge: 2020-08-01 | Disposition: A | Discharge status: Home / Self Care | Payer: Medicare Other | Attending: Emergency Medicine | Admitting: Emergency Medicine

## 2020-08-01 ENCOUNTER — Encounter (HOSPITAL_COMMUNITY): Payer: Self-pay | Admitting: Emergency Medicine

## 2020-08-01 ENCOUNTER — Other Ambulatory Visit: Payer: Self-pay

## 2020-08-01 DIAGNOSIS — E669 Obesity, unspecified: Secondary | ICD-10-CM

## 2020-08-01 DIAGNOSIS — R1310 Dysphagia, unspecified: Secondary | ICD-10-CM | POA: Diagnosis not present

## 2020-08-01 DIAGNOSIS — E8881 Metabolic syndrome: Secondary | ICD-10-CM

## 2020-08-01 LAB — CBG MONITORING, ED: Glucose-Capillary: 96 mg/dL (ref 70–99)

## 2020-08-01 MED ORDER — ONDANSETRON 4 MG PO TBDP
ORAL_TABLET | ORAL | Status: AC
  Filled 2020-08-01: qty 1

## 2020-08-01 MED ORDER — ONDANSETRON 8 MG PO TBDP: ORAL_TABLET | ORAL | 0 refills | Status: DC

## 2020-08-01 MED ORDER — ONDANSETRON 4 MG PO TBDP
4.0000 mg | ORAL_TABLET | Freq: Once | ORAL | Status: AC
  Administered 2020-08-01: 4 mg via ORAL

## 2020-08-01 NOTE — Discharge Instructions (Addendum)
You can try the Zofran. Try drinking small amounts of electrolyte containing fluids such as Pedialyte. If you cannot swallow bites of food, try pured diet. Decrease the Victoza to 0.6 for now. I have sent a note to your doctor asking that they call you with a follow-up ASAP. You may need to be seen by GI.

## 2020-08-01 NOTE — Telephone Encounter (Signed)
Patient calls nurse line regarding vomiting and GI upset since increasing Victoza. Patient reports that she has been vomiting since yesterday morning around 9 am. Patient reports that she is not able to tolerate fluids. Advised patient that due to duration of vomiting and not being able to hold down fluids, she should be evaluated today. Recommended UC/ ED evaluation.   Patient verbalizes understanding and will follow up with clinic next week.   Talbot Grumbling, RN

## 2020-08-01 NOTE — ED Triage Notes (Signed)
Pt presents with nausea and vomiting xs 1 weeks. States was started on insulin on 07-10-2020. Was told by prescribing doctor last week to decrease dose but states has not helped symptoms.  Offered Zofran for nausea and pt states she was told that she could received IV zofran. Advised pt that we are not able to do that.

## 2020-08-01 NOTE — ED Provider Notes (Signed)
HPI  SUBJECTIVE:  Kimberly Browning is a 45 y.o. female who presents with 1 week of delusion, 2-3 episodes of nonbilious nonbloody emesis.  She states that she is unable to "keep anything down." She states that things get "stuck in my lower chest", accompanied with pain.  She states that things get stuck every time she tries to eat or drink.  Has equal difficulty with solids and liquids. when food or drink enters her stomach, she states that " it comes right back up."  She states that she is afraid to eat.  She is able to keep medications down.  No fevers, abdominal pain change in urine output.  She thinks that she has lost some weight.  She was started on Victoza on 2/3 for weight loss.  she has gradually tapered up, started getting nausea at 1.8 mg, and it was decreased to 1.2 milligrams, but then she started having emesis/dysphagia She tried saltine crackers. No alleviating factors. Symptoms worse with eating/cdrinking.  No other recent change in medications.  Patient has a past medical history of hypercholesterolemia, GERD, prediabetes, depression.  No history of esophageal spasm, achalasia, esophageal stricture.    Patient on liraglutide/Victoza for weight loss, and is not on any insulin contrary to triage note.   PMD: [facility]  Past Medical History:  Diagnosis Date  . Bipolar disorder ([place])   . Depression   . Dry skin    hands  . Feeling of incomplete bladder emptying   . GERD (gastroesophageal reflux disease)   . Hypercholesteremia   . Nocturia   . Urge urinary incontinence   . Vitamin D deficiency   . Wears contact lenses     Past Surgical History:  Procedure Laterality Date  . BREAST REDUCTION SURGERY  05/10/2011   Procedure: MAMMARY REDUCTION BILATERAL (BREAST);  Surgeon: Hermelinda Dellen;  Location: [place];  Service: Plastics;  Laterality: Bilateral;  bilateral breast reduction  . DILATION AND EVACUATION  12/26/2008  . INTERSTIM IMPLANT  PLACEMENT N/A 07/09/2014   Procedure: Barrie Lyme IMPLANT FIRST STAGE;  Surgeon: Reece Packer, MD;  Location: [facility];  Service: Urology;  Laterality: N/A;  . INTERSTIM IMPLANT PLACEMENT N/A 07/09/2014   Procedure: Barrie Lyme IMPLANT SECOND STAGE;  Surgeon: Reece Packer, MD;  Location: [place];  Service: Urology;  Laterality: N/A;  . INTRAUTERINE DEVICE (IUD) INSERTION  sept 2011   mirena  . WISDOM TOOTH EXTRACTION  as a teenager    Family History  Adopted: Yes    Social History   Tobacco Use  . Smoking status: Former Smoker    Packs/day: 0.25    Years: 19.00    Pack years: 4.75    Types: Cigarettes    Quit date: 09/23/2019    Years since quitting: 0.8  . Smokeless tobacco: Never Used  . Tobacco comment:  1 pp3d  Substance Use Topics  . Alcohol use: No  . Drug use: No    No current facility-administered medications for this encounter.  Current Outpatient Medications:  .  ondansetron (ZOFRAN ODT) 8 MG disintegrating tablet, 1/2- 1 tablet q 8 hr prn nausea, vomiting, Disp: 20 tablet, Rfl: 0 .  amantadine (SYMMETREL) 100 MG capsule, Take 100 mg by mouth daily., Disp: , Rfl:  .  Insulin Pen Needle (BD PEN NEEDLE NANO U/F) 32G X 4 MM MISC, 1 application by Does not apply route daily., Disp: 200 each, Rfl: 2 .  lamoTRIgine (LAMICTAL) 100  MG tablet, Take 100 mg by mouth at bedtime., Disp: , Rfl:  .  liraglutide (VICTOZA) 18 MG/3ML SOPN, Inject 0.6 mg once daily for 1 week,then increase to 1.2 mg once daily for 1 week, then increase to 1.8mg  for 1 week., Disp: 9 mL, Rfl: 0 .  mirtazapine (REMERON) 30 MG tablet, Take 30 mg by mouth at bedtime., Disp: , Rfl:  .  RABEprazole (ACIPHEX) 20 MG tablet, Take 1 tablet (20 mg total) by mouth daily., Disp: 90 tablet, Rfl: 1  Allergies  Allergen Reactions  . Geodon [Ziprasidone Hydrochloride] Shortness Of Breath and Swelling  . Hibiclens [Chlorhexidine] Itching    Redness and itching after using CHG  wipes  . Sulfamethoxazole-Trimethoprim Other (See Comments)    hallucinations  . Doxycycline Other (See Comments)    Unknown reaction  . Gabapentin Other (See Comments)    Unknown reaction  . Other Other (See Comments)    Bath and body works body wash and lotion caused itching, reddness and rash  . Penicillins Other (See Comments)    Unknown reaction Did it involve swelling of the face/tongue/throat, SOB, or low BP? Unknown Did it involve sudden or severe rash/hives, skin peeling, or any reaction on the inside of your mouth or nose? Unknown Did you need to seek medical attention at a hospital or doctor's office? Unknown When did it last happen?unknown If all above answers are "NO", may proceed with cephalosporin use.  . Promethazine Other (See Comments)  . Depakote [Divalproex Sodium] Rash     ROS  As noted in HPI.   Physical Exam  BP 117/69 (BP Location: Left Arm)   Pulse 91   Temp 99.5 F (37.5 C) (Oral)   Resp 18   LMP 07/31/2020   SpO2 99%   Constitutional: Well developed, well nourished, no acute distress Eyes:  EOMI, conjunctiva normal bilaterally HENT: Normocephalic, atraumatic,mucus membranes moist Respiratory: Normal inspiratory effort, lungs clear bilaterally Cardiovascular: Normal rate, regular rhythm, no murmurs rubs or gallops. Cap refill less than 2 seconds GI: nondistended soft, nontender, no guarding, rebound. Active bowel sounds. skin: No rash, skin intact Musculoskeletal: no deformities Neurologic: Alert & oriented x 3, no focal neuro deficits Psychiatric: Speech and behavior appropriate   ED Course   Medications  ondansetron (ZOFRAN-ODT) disintegrating tablet 4 mg (4 mg Oral Given 08/01/20 1209)    Orders Placed This Encounter  Procedures  . POC CBG monitoring    Standing Status:   Standing    Number of Occurrences:   1    Results for orders placed or performed during the hospital encounter of 08/01/20 (from the past 24 hour(s))   POC CBG monitoring     Status: None   Collection Time: 08/01/20 11:44 AM  Result Value Ref Range   Glucose-Capillary 96 70 - 99 mg/dL   No results found.  ED Clinical Impression  1. Dysphagia, unspecified type   2. Metabolic syndrome   3. Obesity (BMI 30.0-34.9)      ED Assessment/Plan  Outside records reviewed.  As noted in HPI.  Fingerstick 96  Patient is describing dysphagia. Abdomen is benign. Doubt intra-abdominal process. She denies any abdominal pain. May be a side effect from the [place] although it is not listed as such, may be achalasia or esophageal spasm, esophageal stricture due to GERD. Will give Zofran ODT here, and send home with a prescription of it. Will Have her push small amounts of electrolyte containing fluids, and sent a message to her PMD to  see if she could be seen on Monday. Discussed with patient that she may need to be seen by GI for upper endoscopy. ER return precautions given  Sent note to PMD requesting follow-up ASAP.  Discussed labs,  MDM, treatment plan, and plan for follow-up with patient. Discussed sn/sx that should prompt return to the ED. patient agrees with plan.   Meds ordered this encounter  Medications  . ondansetron (ZOFRAN-ODT) disintegrating tablet 4 mg  . ondansetron (ZOFRAN ODT) 8 MG disintegrating tablet    Sig: 1/2- 1 tablet q 8 hr prn nausea, vomiting    Dispense:  20 tablet    Refill:  0    *This clinic note was created using [employer]. Therefore, there may be occasional mistakes despite careful proofreading.   ?    Melynda Ripple, MD 08/02/20 [phone]

## 2020-08-04 ENCOUNTER — Telehealth: Payer: Self-pay | Admitting: Family Medicine

## 2020-08-04 ENCOUNTER — Other Ambulatory Visit: Payer: Self-pay | Admitting: Family Medicine

## 2020-08-04 DIAGNOSIS — E8881 Metabolic syndrome: Secondary | ICD-10-CM

## 2020-08-04 DIAGNOSIS — E669 Obesity, unspecified: Secondary | ICD-10-CM

## 2020-08-04 NOTE — Telephone Encounter (Signed)
Spoke to patient to check in on her after urgent care evaluation on 2/25 for persistent nausea/vomiting likely due to GLP-1. This continued despite tritration down to previously lowered doses. She notes that she has been taking the 0.6mg  and has continued to have nausea and vomiting. Last vomit was last night. .  Discussed management plans including ED visit for further evaluation/hydration vs drug holiday with emphasis on adequate hydration. Patient opted for latter at this time. Discussed low threshold to report to ED if unable to tolerate PO going forward. Patient instructed to stop Victoza. Plan to follow up on 3/9 to see how she is doing and plan to transition to very low dose Ozempic. She was instructed to check in on Wednesday, sooner if no improvement, to inform me of how she is doing. She voiced understanding and agreement with plan. If I dont hear from her I will personally check in with patient.

## 2020-08-05 ENCOUNTER — Telehealth (HOSPITAL_COMMUNITY): Payer: Self-pay

## 2020-08-05 ENCOUNTER — Other Ambulatory Visit: Payer: Self-pay

## 2020-08-05 ENCOUNTER — Encounter (HOSPITAL_COMMUNITY): Payer: Self-pay | Admitting: Emergency Medicine

## 2020-08-05 ENCOUNTER — Emergency Department (HOSPITAL_COMMUNITY)
Admission: EM | Admit: 2020-08-05 | Discharge: 2020-08-06 | Disposition: A | Discharge status: Home / Self Care | Payer: Medicare Other | Attending: Emergency Medicine | Admitting: Emergency Medicine

## 2020-08-05 DIAGNOSIS — Z87891 Personal history of nicotine dependence: Secondary | ICD-10-CM | POA: Insufficient documentation

## 2020-08-05 DIAGNOSIS — R112 Nausea with vomiting, unspecified: Secondary | ICD-10-CM | POA: Diagnosis not present

## 2020-08-05 DIAGNOSIS — Z794 Long term (current) use of insulin: Secondary | ICD-10-CM | POA: Insufficient documentation

## 2020-08-05 LAB — URINALYSIS, ROUTINE W REFLEX MICROSCOPIC
Glucose, UA: NEGATIVE mg/dL
Hgb urine dipstick: NEGATIVE
Ketones, ur: 5 mg/dL — AB
Nitrite: NEGATIVE
Protein, ur: 30 mg/dL — AB
Specific Gravity, Urine: 1.027 (ref 1.005–1.030)
pH: 6 (ref 5.0–8.0)

## 2020-08-05 LAB — CBC
HCT: 44.5 % (ref 36.0–46.0)
Hemoglobin: 14 g/dL (ref 12.0–15.0)
MCH: 26.9 pg (ref 26.0–34.0)
MCHC: 31.5 g/dL (ref 30.0–36.0)
MCV: 85.6 fL (ref 80.0–100.0)
Platelets: 329 10*3/uL (ref 150–400)
RBC: 5.2 MIL/uL — ABNORMAL HIGH (ref 3.87–5.11)
RDW: 14.3 % (ref 11.5–15.5)
WBC: 8.5 10*3/uL (ref 4.0–10.5)
nRBC: 0 % (ref 0.0–0.2)

## 2020-08-05 LAB — COMPREHENSIVE METABOLIC PANEL
ALT: 13 U/L (ref 0–44)
AST: 15 U/L (ref 15–41)
Albumin: 4.1 g/dL (ref 3.5–5.0)
Alkaline Phosphatase: 95 U/L (ref 38–126)
Anion gap: 12 (ref 5–15)
BUN: 5 mg/dL — ABNORMAL LOW (ref 6–20)
CO2: 24 mmol/L (ref 22–32)
Calcium: 9.7 mg/dL (ref 8.9–10.3)
Chloride: 105 mmol/L (ref 98–111)
Creatinine, Ser: 1.04 mg/dL — ABNORMAL HIGH (ref 0.44–1.00)
GFR, Estimated: >60 mL/min (ref >60)
Glucose, Bld: 88 mg/dL (ref 70–99)
Potassium: 3.7 mmol/L (ref 3.5–5.1)
Sodium: 141 mmol/L (ref 135–145)
Total Bilirubin: 0.4 mg/dL (ref 0.3–1.2)
Total Protein: 7 g/dL (ref 6.5–8.1)

## 2020-08-05 LAB — I-STAT BETA HCG BLOOD, ED (MC, WL, AP ONLY): I-stat hCG, quantitative: <5 m[IU]/mL (ref <5)

## 2020-08-05 LAB — LIPASE, BLOOD: Lipase: 40 U/L (ref 11–51)

## 2020-08-05 MED ORDER — METOCLOPRAMIDE HCL 10 MG PO TABS: 10.0000 mg | ORAL_TABLET | Freq: Four times a day (QID) | ORAL | 0 refills | Status: DC | PRN

## 2020-08-05 MED ORDER — LACTATED RINGERS IV BOLUS
1000.0000 mL | Freq: Once | INTRAVENOUS | Status: AC
  Administered 2020-08-05: 1000 mL via INTRAVENOUS

## 2020-08-05 MED ORDER — DIPHENHYDRAMINE HCL 50 MG/ML IJ SOLN
25.0000 mg | Freq: Once | INTRAMUSCULAR | Status: AC
  Administered 2020-08-05: 25 mg via INTRAVENOUS
  Filled 2020-08-05: qty 1

## 2020-08-05 MED ORDER — METOCLOPRAMIDE HCL 5 MG/ML IJ SOLN
10.0000 mg | Freq: Once | INTRAMUSCULAR | Status: AC
  Administered 2020-08-05: 10 mg via INTRAVENOUS
  Filled 2020-08-05: qty 2

## 2020-08-05 NOTE — Discharge Instructions (Signed)
Today you received medications that may make you sleepy or impair your ability to make decisions.  For the next 24 hours please do not drive, operate heavy machinery, care for a small child with out another adult present, or perform any activities that may cause harm to you or someone else if you were to fall asleep or be impaired.   

## 2020-08-05 NOTE — ED Provider Notes (Signed)
[facility] EMERGENCY DEPARTMENT Provider Note   CSN: [id number] Arrival date & time: 08/05/20  1218     History Chief Complaint  Patient presents with  . Emesis    Kimberly Browning is a 45 y.o. female with a past medical history of bipolar, depression, high cholesterol, GERD, who presents today for evaluation of nausea and vomiting since 07/26/2020. She reports that she had been on Victoza and her nausea and vomiting started when she increased her dose to 1.8 mg. She has been seen at urgent care since and was told to follow-up with GI.  Patient had been stopped on her Victoza yesterday however continues to vomit.  She says the last time she had any Zofran was about 3 days ago, noting that she was vomiting after about 3 hours after taking the Zofran. She states that she is able to swallow all of her pills and keep those down.  She says that she has not had anything like this before. Her abdomen hurts only when she vomits, otherwise does not hurt.  Patient initially reported she was unable to keep down anything including water, however she had a open bottle of flavored water in the bed with her that she has been drinking. HPI     Past Medical History:  Diagnosis Date  . Bipolar disorder ([name])   . Depression   . Dry skin    hands  . Feeling of incomplete bladder emptying   . GERD (gastroesophageal reflux disease)   . Hypercholesteremia   . Nocturia   . Urge urinary incontinence   . Vitamin D deficiency   . Wears contact lenses     Patient Active Problem List   Diagnosis Date Noted  . Elevated serum creatinine 07/17/2020  . IUD contraception 03/18/2020  . Onychomycosis of left great toe 10/24/2019  . Obesity (BMI 30.0-34.9) 08/24/2019  . Depression with anxiety   . Gastroesophageal reflux disease without esophagitis 01/29/2016  . 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 History of tobacco use 06/09/2009  . History of molar pregnancy, antepartum 12/12/2008   . INSOMNIA 12/11/2008  . 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 Hyperlipidemia 01/03/2007  . Urinary incontinence, mixed 11/25/2006  . BIPOLAR DISORDER 08/04/2006    Past Surgical History:  Procedure Laterality Date  . BREAST REDUCTION SURGERY  05/10/2011   Procedure: MAMMARY REDUCTION BILATERAL (BREAST);  Surgeon: Hermelinda Dellen;  Location: [facility];  Service: Plastics;  Laterality: Bilateral;  bilateral breast reduction  . DILATION AND EVACUATION  12/26/2008  . INTERSTIM IMPLANT PLACEMENT N/A 07/09/2014   Procedure: Barrie Lyme IMPLANT FIRST STAGE;  Surgeon: Reece Packer, MD;  Location: [facility];  Service: Urology;  Laterality: N/A;  . INTERSTIM IMPLANT PLACEMENT N/A 07/09/2014   Procedure: Barrie Lyme IMPLANT SECOND STAGE;  Surgeon: Reece Packer, MD;  Location: [facility];  Service: Urology;  Laterality: N/A;  . INTRAUTERINE DEVICE (IUD) INSERTION  sept 2011   mirena  . WISDOM TOOTH EXTRACTION  as a teenager     OB History   No obstetric history on file.     Family History  Adopted: Yes    Social History   Tobacco Use  . Smoking status: Former Smoker    Packs/day: 0.25    Years: 19.00    Pack years: 4.75    Types: Cigarettes    Quit date: 09/23/2019    Years since quitting: 0.8  . Smokeless tobacco: Never Used  . Tobacco comment:  1 pp3d  Substance Use Topics  . Alcohol use: No  . Drug use: No    Home Medications Prior to Admission medications   Medication Sig Start Date End Date Taking? Authorizing Provider  metoCLOPramide (REGLAN) 10 MG tablet Take 1 tablet (10 mg total) by mouth every 6 (six) hours as needed for nausea or vomiting. 08/05/20  Yes Lorin Glass, PA-C  amantadine (SYMMETREL) 100 MG capsule Take 100 mg by mouth daily. 01/02/20   [provider]  Insulin Pen Needle (BD PEN NEEDLE NANO U/F) 32G X 4 MM MISC 1 application by Does not apply route daily. 07/02/20   Mullis, Kiersten P, DO   lamoTRIgine (LAMICTAL) 100 MG tablet Take 100 mg by mouth at bedtime. 12/31/19   [provider]  liraglutide (VICTOZA) 18 MG/3ML SOPN Inject 0.6 mg once daily for 1 week,then increase to 1.2 mg once daily for 1 week, then increase to 1.8mg  for 1 week. 06/20/20   Mullis, Kiersten P, DO  mirtazapine (REMERON) 30 MG tablet Take 30 mg by mouth at bedtime. 01/15/20   [provider]  ondansetron (ZOFRAN ODT) 8 MG disintegrating tablet 1/2- 1 tablet q 8 hr prn nausea, vomiting 08/01/20   Melynda Ripple, MD  RABEprazole (ACIPHEX) 20 MG tablet Take 1 tablet (20 mg total) by mouth daily. 05/09/20   Mullis, Kiersten P, DO    Allergies    Geodon [ziprasidone hydrochloride], Hibiclens [chlorhexidine], Sulfamethoxazole-trimethoprim, Doxycycline, Gabapentin, Other, Penicillins, Promethazine, and Depakote [divalproex sodium]  Review of Systems   Review of Systems  Constitutional: Positive for fatigue. Negative for chills and fever.  Eyes: Negative for visual disturbance.  Respiratory: Negative for cough and shortness of breath.   Cardiovascular: Negative for chest pain.  Gastrointestinal: Positive for abdominal pain (Only when vomiting).  Genitourinary: Negative for dysuria.  Musculoskeletal: Negative for back pain and neck pain.  Neurological: Positive for weakness (Global). Negative for headaches.  Psychiatric/Behavioral: Negative for confusion.  All other systems reviewed and are negative.   Physical Exam Updated Vital Signs BP 113/69 (BP Location: Left Arm)   Pulse 71   Temp 98.5 F (36.9 C) (Oral)   Resp 20   LMP 07/31/2020   SpO2 100%   Physical Exam Vitals and nursing note reviewed.  Constitutional:      General: She is not in acute distress.    Appearance: She is not diaphoretic.  HENT:     Head: Normocephalic and atraumatic.  Eyes:     General: No scleral icterus.       Right eye: No discharge.        Left eye: No discharge.     Conjunctiva/sclera:  Conjunctivae normal.  Cardiovascular:     Rate and Rhythm: Normal rate and regular rhythm.     Pulses: Normal pulses.     Heart sounds: Normal heart sounds.  Pulmonary:     Effort: Pulmonary effort is normal. No respiratory distress.     Breath sounds: Normal breath sounds. No stridor.  Abdominal:     General: There is no distension.     Palpations: Abdomen is soft.     Tenderness: There is no abdominal tenderness. There is no guarding.  Musculoskeletal:        General: No deformity.     Cervical back: Normal range of motion.  Skin:    General: Skin is warm and dry.  Neurological:     Mental Status: She is alert.     Motor: No abnormal muscle  tone.     Comments: Patient is awake and alert.  Speech is not slurred.  Answers questions appropriately.  Psychiatric:        Behavior: Behavior normal.     ED Results / Procedures / Treatments   Labs (all labs ordered are listed, but only abnormal results are displayed) Labs Reviewed  COMPREHENSIVE METABOLIC PANEL - Abnormal; Notable for the following components:      Result Value   BUN 5 (*)    Creatinine, Ser 1.04 (*)    All other components within normal limits  CBC - Abnormal; Notable for the following components:   RBC 5.20 (*)    All other components within normal limits  URINALYSIS, ROUTINE W REFLEX MICROSCOPIC - Abnormal; Notable for the following components:   Color, Urine AMBER (*)    APPearance CLOUDY (*)    Bilirubin Urine SMALL (*)    Ketones, ur 5 (*)    Protein, ur 30 (*)    Leukocytes,Ua TRACE (*)    Bacteria, UA RARE (*)    Non Squamous Epithelial 0-5 (*)    All other components within normal limits  LIPASE, BLOOD  I-STAT BETA HCG BLOOD, ED ([facility], [facility], [facility] ONLY)    EKG None  Radiology No results found.  Procedures Procedures   Medications Ordered in ED Medications  lactated ringers bolus 1,000 mL (1,000 mLs Intravenous New Bag/Given 08/05/20 2153)  metoCLOPramide (REGLAN) injection 10 mg (10 mg  Intravenous Given 08/05/20 2142)  diphenhydrAMINE (BENADRYL) injection 25 mg (25 mg Intravenous Given 08/05/20 2141)    ED Course  I have reviewed the triage vital signs and the nursing notes.  Pertinent labs & imaging results that were available during my care of the patient were reviewed by me and considered in my medical decision making (see chart for details).  Clinical Course as of 08/05/20 2359  Tue Aug 05, 2020  2337 Patient reevaluated, she is eating and tolerating her food without difficulty. [EH]    Clinical Course User Index [EH] Ollen Gross   MDM Rules/Calculators/A&P                         Patient is a 45 year old woman who presents today for evaluation of ongoing vomiting since she had an increased dose of Victoza.  On exam patient is awake and alert and is generally well-appearing.  Her abdomen is soft, nontender nondistended and she reports that it only hurts when she vomits.  She denies urinary symptoms.  CMP is consistent with baseline, and CBC is unremarkable.  Lipase is not elevated.  Given patient's course of onset after a medicine known to cause nausea vomiting with her abdomen being soft nontender nondistended doubt acute abdomen.  She has no rebound or guarding and no prior abdominal surgeries.  Given that her blood work was reassuring along with her exam and history no imaging is indicated at this time as patient does not appear to be obstructed.  She reports she is unable to eat or drink anything however was noted to be drinking liquids.  Additionally her urine specific gravity is only 1.027 and she only has 5 ketones.   She is given IV fluids, Reglan, and Benadryl and allowed to p.o. challenge, after this she stated that she felt much better and was ready to go home. We discussed the importance of outpatient GI follow-up, she states she is waiting for them to call her for an appointment.  I recommended that if she does not get this call she follow back  up with her PCP.  Prescription is given for Reglan at home.  On repeat exam patient's abdomen remains soft nontender nondistended, she is well-appearing without a surgical abdomen.  Return precautions were discussed with patient who states their understanding.  At the time of discharge patient denied any unaddressed complaints or concerns.  Patient is agreeable for discharge home.  Note: Portions of this report may have been transcribed using voice recognition software. Every effort was made to ensure accuracy; however, inadvertent computerized transcription errors may be present   Final Clinical Impression(s) / ED Diagnoses Final diagnoses:  Non-intractable vomiting with nausea, unspecified vomiting type    Rx / DC Orders ED Discharge Orders         Ordered    metoCLOPramide (REGLAN) 10 MG tablet  Every 6 hours PRN        08/05/20 2347           Lorin Glass, PA-C 08/05/20 2359    Dorie Rank, MD 08/07/20 1100

## 2020-08-05 NOTE — ED Triage Notes (Signed)
Pt arrives to ED with c/o continued nausea and vomiting x2-3 weeks. Pt started on Victoza on 2/3 for weight loss. Pt unable to eat/drink without nausea. Reports feels weak and fatigued.

## 2020-08-05 NOTE — Telephone Encounter (Signed)
Patient calls nurse line reporting continued vomiting throughout yesterday. Patient reports she has not vomited today, however has not eaten anything either. Patient states when she eats she throws up. Patient advised to try and eat something small, like crackers or applesauce and to stay well hydrated. Patient advised to report to the ED if she can not keep fluids down. Patient advised to call with an update tomorrow per PCP note.

## 2020-08-06 ENCOUNTER — Other Ambulatory Visit: Payer: Self-pay | Admitting: *Deleted

## 2020-08-06 DIAGNOSIS — E8881 Metabolic syndrome: Secondary | ICD-10-CM

## 2020-08-06 DIAGNOSIS — E669 Obesity, unspecified: Secondary | ICD-10-CM

## 2020-08-06 NOTE — ED Notes (Signed)
Dc instructions reviewed with the pt.  PT verbalized understanding.  PT DC.

## 2020-08-07 ENCOUNTER — Other Ambulatory Visit: Payer: Self-pay

## 2020-08-07 MED ORDER — RABEPRAZOLE SODIUM 20 MG PO TBEC
20.0000 mg | DELAYED_RELEASE_TABLET | Freq: Every day | ORAL | 1 refills | Status: DC
Start: 2020-08-07 — End: 2020-08-13

## 2020-08-07 NOTE — Telephone Encounter (Signed)
Patient returns call to nurse line to check status of rx refill. Advised patient that request had been sent to provider and informed of refill policy. Patient verbalizes understanding, however, is anxious due to fear of running out of medication.   Talbot Grumbling, RN

## 2020-08-08 ENCOUNTER — Telehealth: Payer: Self-pay

## 2020-08-08 NOTE — Telephone Encounter (Signed)
Received fax from pharmacy, PA needed on Rabeprazole. Clinical questions submitted via Cover My Meds. Waiting on response, could take up to 72 hours.  Cover My Meds info: Key: [id number]

## 2020-08-10 ENCOUNTER — Other Ambulatory Visit: Payer: Self-pay | Admitting: Family Medicine

## 2020-08-10 DIAGNOSIS — E669 Obesity, unspecified: Secondary | ICD-10-CM

## 2020-08-10 DIAGNOSIS — E8881 Metabolic syndrome: Secondary | ICD-10-CM

## 2020-08-11 NOTE — Progress Notes (Signed)
   Subjective:   Patient ID: Kimberly Browning    DOB: 04/12/1976, 45 y.o. female   MRN: [id number]  Kimberly Browning is a 45 y.o. female with a history of GERD, onychomycosis, bipolar disorder, depression/anxiety, herpes labialis, h/o tobacco use, HLD, insomnia, IUD contraception, obesity here for weight loss follow up  Weight loss Follow up: Patient has been monitored for weight loss. She was treating with Victoza and titration upwards weekly. She had been tolerating well until she titrated up to 1.8mg  daily on 2/19. She then developed nausea and vomiting. She was seen in the ED on 2/25 and 3/1 for persistent symptoms. Her work up in the ED was normal including Lipase,  POC CBG, CMP, B-hCG, UA. She was given IV fluids, Reglan, and Benadryl with improvement in her symptoms. Her abdominal exam was benign on both encounters. She was eventually discontinued from Victoza with slow improvement in her symptoms.  Today she notes doing much better. Denies any further vomiting or nausea. She is excited to start an alternative medicine. She does not want to retry the Victoza due to fear of recurrence of symptoms.  Last Weight: 171.8lbs Current weight: 170.8lbs  Review of Systems:  Per HPI.   Objective:   BP 124/80   Pulse 74   Wt 170 lb 12.8 oz (77.5 kg)   LMP 07/31/2020   SpO2 95%   BMI 29.32 kg/m  Vitals and nursing note reviewed.  General: pleasant middle aged woman, sitting comfortably in exam chair, well nourished, well developed, in no acute distress with non-toxic appearance Resp: breathing comfortably on room air, speaking in full sentences MSK:  gait normal Neuro: Alert and oriented, speech normal  Assessment & Plan:   Obesity (BMI 30.0-34.9) Chronic. Initially tolerating Victoza with adequate titration however developed persistent nausea and vomiting despite tapering back down to lowest dose. Unclear if true intolerance vs possible GI bug. However, patient is very hesitant to restart  Victoza. Discussed case with Dr. Valentina Lucks and pharmacy team who agreed with transition to low dose Ozemipic. Pharmacy team provided teaching for medication administration and sample provided. Patient to start Ozempic 0.25mg  qweekly x 4 weeks and follow up for monitoring. If tolerating can consider titration upward. Patient voiced understanding and agreement with plan.   Medication Samples have been provided to the patient.  Drug name: Ozempic       Strength: 0.25mg        Qty: 1  LOT: [id number]  Exp.Date: 10/05/2022  Dosing instructions: Inject 0.25mg  once a week x 4 weeks.  The patient has been instructed regarding the correct time, dose, and frequency of taking this medication, including desired effects and most common side effects.   No orders of the defined types were placed in this encounter.  Meds ordered this encounter  Medications  . omeprazole (PRILOSEC) 20 MG capsule    Sig: Take 1 capsule (20 mg total) by mouth daily. Can increase to 2 tablets daily (40mg ) if continued symptoms.    Dispense:  60 capsule    Refill:  [place], DO PGY-3, [facility] 08/13/2020 2:56 PM

## 2020-08-13 ENCOUNTER — Other Ambulatory Visit: Payer: Self-pay

## 2020-08-13 ENCOUNTER — Ambulatory Visit (INDEPENDENT_AMBULATORY_CARE_PROVIDER_SITE_OTHER): Payer: Medicare Other | Admitting: Family Medicine

## 2020-08-13 ENCOUNTER — Encounter: Payer: Self-pay | Admitting: Family Medicine

## 2020-08-13 VITALS — BP 124/80 | HR 74 | Wt 170.8 lb

## 2020-08-13 DIAGNOSIS — K219 Gastro-esophageal reflux disease without esophagitis: Secondary | ICD-10-CM | POA: Diagnosis not present

## 2020-08-13 DIAGNOSIS — E669 Obesity, unspecified: Secondary | ICD-10-CM

## 2020-08-13 MED ORDER — OMEPRAZOLE 20 MG PO CPDR: 20.0000 mg | DELAYED_RELEASE_CAPSULE | Freq: Every day | ORAL | 3 refills | Status: DC

## 2020-08-13 NOTE — Progress Notes (Signed)
Asked by Dr. Tarry Kos to counsel patient on Ozempic 0.25 mg SubQ once weekly use and administration. Pt previously had nausea and vomiting on Victoza, therefore, counseled patient on switching to Ozempic and starting at the lowest dose. Pt verbalized understanding.  Patient educated on purpose, proper use and potential adverse effects of Ozempic. Following instruction patient verbalized understanding of treatment plan. Sample provided.   Shauna Hugh, PharmD, [place]  PGY-1 Pharmacy Resident 08/13/2020 12:12 PM

## 2020-08-13 NOTE — Patient Instructions (Signed)
I am sorry you felt so bad recently. I wonder if you had a viral GI bug I do STRONGLY encourage you to walk at least 30 minutes every day, preferably during the daytime. Start Ozempic 0.25mg  weekly for 4 weeks. Please follow up in 4 weeks for continued monitoring. WE will discuss going up if you are doing well.

## 2020-08-13 NOTE — Assessment & Plan Note (Signed)
Chronic. Initially tolerating Victoza with adequate titration however developed persistent nausea and vomiting despite tapering back down to lowest dose. Unclear if true intolerance vs possible GI bug. However, patient is very hesitant to restart Victoza. Discussed case with Dr. Valentina Lucks and pharmacy team who agreed with transition to low dose Ozemipic. Pharmacy team provided teaching for medication administration and sample provided. Patient to start Ozempic 0.25mg  qweekly x 4 weeks and follow up for monitoring. If tolerating can consider titration upward. Patient voiced understanding and agreement with plan.   Medication Samples have been provided to the patient.  Drug name: Ozempic       Strength: 0.25mg        Qty: 1  LOT: [id number]  Exp.Date: 10/05/2022  Dosing instructions: Inject 0.25mg  once a week x 4 weeks.  The patient has been instructed regarding the correct time, dose, and frequency of taking this medication, including desired effects and most common side effects.

## 2020-08-15 ENCOUNTER — Encounter: Payer: Self-pay | Admitting: Plastic Surgery

## 2020-08-15 ENCOUNTER — Ambulatory Visit (INDEPENDENT_AMBULATORY_CARE_PROVIDER_SITE_OTHER): Payer: Medicare Other | Admitting: Plastic Surgery

## 2020-08-15 ENCOUNTER — Other Ambulatory Visit: Payer: Self-pay

## 2020-08-15 ENCOUNTER — Telehealth: Payer: Self-pay

## 2020-08-15 DIAGNOSIS — M542 Cervicalgia: Secondary | ICD-10-CM

## 2020-08-15 DIAGNOSIS — N62 Hypertrophy of breast: Secondary | ICD-10-CM

## 2020-08-15 DIAGNOSIS — G8929 Other chronic pain: Secondary | ICD-10-CM

## 2020-08-15 DIAGNOSIS — M549 Dorsalgia, unspecified: Secondary | ICD-10-CM | POA: Insufficient documentation

## 2020-08-15 DIAGNOSIS — M546 Pain in thoracic spine: Secondary | ICD-10-CM

## 2020-08-15 HISTORY — DX: Hypertrophy of breast: N62

## 2020-08-15 NOTE — Addendum Note (Signed)
Addended by: Wallace Going on: 08/15/2020 10:18 AM   Modules accepted: Level of Service

## 2020-08-15 NOTE — Telephone Encounter (Signed)
Patient calls nurse line requesting to speak with PCP about recent plastic surgery apt. Patient reports she is disappointed in the visit and would like to discuss with PCP. Patient also has some questions regarding Ozempic.

## 2020-08-15 NOTE — Progress Notes (Signed)
Patient ID: Kimberly Browning, female    DOB: [birth year], 45 y.o.   MRN: [id number]   Chief Complaint  Patient presents with  . Advice Only  . Breast Problem    Mammary Hyperplasia: The patient is a 45 y.o. female with a history of mammary hyperplasia for several years.  She has extremely large breasts causing symptoms that include the following: Back pain in the upper and lower back, including neck pain. She pulls or pins her bra straps to provide better lift and relief of the pressure and pain. She notices relief by holding her breast up manually.  Her shoulder straps cause grooves and pain and pressure that requires padding for relief. Pain medication is sometimes required with motrin and tylenol.  Activities that are hindered by enlarged breasts include: exercise and running.  She has tried supportive clothing as well as fitted bras without improvement.  Her breasts are extremely large and fairly symmetric.  She has hyperpigmentation of the inframammary area on both sides.  The sternal to nipple distance on the right is 20 cm and the left is 20 cm.  The IMF distance is 12 cm.  She is 5 feet 4 inches tall and weighs 170 pounds.  Preoperative bra size = 38 C/D cup.  The estimated excess breast tissue to be removed at the time of surgery = 100 grams on the left and 100 grams on the right.  Mammogram history: 2019 and negative.  Family history of breast cancer:  none.  Tobacco use:  Quite 2021.   The patient has had 2 right breast reductions in the past.  One was with Dr. Towanda Malkin and 1 was with Dr. Percell Miller she is not sure about the date but it was between 2 and 10 years ago.  She has diabetes and is working on blood sugar control.  Her last hemoglobin A1c from January was 5.6.  She also has bipolar and hypertension.  She has gained approximately 30 pounds since going on birth control.  She also had a slipped disc in her back the last couple of months which slowed her down from exercising.  Which she  does not like about her breast is the look of shagginess and slightly medially positioned nipple areolar complexes.   Review of Systems  Constitutional: Positive for activity change. Negative for appetite change.  Eyes: Negative.   Respiratory: Negative for chest tightness and shortness of breath.   Cardiovascular: Negative for leg swelling.  Gastrointestinal: Negative for abdominal distention and abdominal pain.  Endocrine: Negative.   Genitourinary: Negative.   Musculoskeletal: Positive for back pain and neck pain.  Skin: Positive for color change.  Hematological: Negative.   Psychiatric/Behavioral: Negative.     Past Medical History:  Diagnosis Date  . Bipolar disorder ([place])   . Depression   . Dry skin    hands  . Feeling of incomplete bladder emptying   . GERD (gastroesophageal reflux disease)   . Hypercholesteremia   . Nocturia   . Urge urinary incontinence   . Vitamin D deficiency   . Wears contact lenses     Past Surgical History:  Procedure Laterality Date  . BREAST REDUCTION SURGERY  05/10/2011   Procedure: MAMMARY REDUCTION BILATERAL (BREAST);  Surgeon: Hermelinda Dellen;  Location: [facility];  Service: Plastics;  Laterality: Bilateral;  bilateral breast reduction  . DILATION AND EVACUATION  12/26/2008  . INTERSTIM IMPLANT PLACEMENT N/A 07/09/2014   Procedure: INTERSTIM IMPLANT FIRST STAGE;  Surgeon: Reece Packer, MD;  [facility];  Service: Urology;  Laterality: N/A;  . INTERSTIM IMPLANT PLACEMENT N/A 07/09/2014   Procedure: Barrie Lyme IMPLANT SECOND STAGE;  Surgeon: Reece Packer, MD;  Location: [facility];  Service: Urology;  Laterality: N/A;  . INTRAUTERINE DEVICE (IUD) INSERTION  sept 2011   mirena  . WISDOM TOOTH EXTRACTION  as a teenager      Current Outpatient Medications:  .  amantadine (SYMMETREL) 100 MG capsule, Take 100 mg by mouth daily., Disp: , Rfl:  .  Insulin Pen Needle (BD PEN  NEEDLE NANO U/F) 32G X 4 MM MISC, 1 application by Does not apply route daily., Disp: 200 each, Rfl: 2 .  lamoTRIgine (LAMICTAL) 100 MG tablet, Take 100 mg by mouth at bedtime., Disp: , Rfl:  .  metoCLOPramide (REGLAN) 10 MG tablet, Take 1 tablet (10 mg total) by mouth every 6 (six) hours as needed for nausea or vomiting., Disp: 20 tablet, Rfl: 0 .  mirtazapine (REMERON) 30 MG tablet, Take 30 mg by mouth at bedtime., Disp: , Rfl:  .  omeprazole (PRILOSEC) 20 MG capsule, Take 1 capsule (20 mg total) by mouth daily. Can increase to 2 tablets daily (40mg ) if continued symptoms., Disp: 60 capsule, Rfl: 3 .  ondansetron (ZOFRAN ODT) 8 MG disintegrating tablet, 1/2- 1 tablet q 8 hr prn nausea, vomiting, Disp: 20 tablet, Rfl: 0 .  Semaglutide (OZEMPIC, 0.25 OR 0.5 MG/DOSE, [place]), Inject 0.25 mg into the skin once a week., Disp: , Rfl:    Objective:   There were no vitals filed for this visit.  Physical Exam Vitals and nursing note reviewed.  Constitutional:      Appearance: Normal appearance.  HENT:     Head: Normocephalic and atraumatic.  Cardiovascular:     Rate and Rhythm: Normal rate.     Pulses: Normal pulses.  Pulmonary:     Effort: Pulmonary effort is normal.  Abdominal:     General: Abdomen is flat. There is no distension.     Tenderness: There is no abdominal tenderness.     Hernia: No hernia is present.  Musculoskeletal:        General: No swelling or deformity. Normal range of motion.  Skin:    General: Skin is warm.     Capillary Refill: Capillary refill takes less than 2 seconds.     Coloration: Skin is not jaundiced or pale.  Neurological:     General: No focal deficit present.     Mental Status: She is alert and oriented to person, place, and time.  Psychiatric:        Mood and Affect: Mood normal.        Behavior: Behavior normal.     Assessment & Plan:  Chronic bilateral thoracic back pain  Neck pain  Symptomatic mammary hypertrophy  I will refer the patient  back to her PCP or gynecologist for updated mammogram.  I do not think that I could get off more than about 100 g.  I think that she really just needs more of a mastopexy than a reduction.  She has some bottoming out and that could be fixed with tightening the skin.  We have to be very careful and would need to know the previous techniques used to not violate the pedicle.  Overall she does not have a bad look and I remain available if anything in her life changes and she would like to do a  fee-for-service for a mastopexy.  Pictures were obtained of the patient and placed in the chart with the patient's or guardian's permission.   [place], DO

## 2020-08-18 ENCOUNTER — Telehealth: Payer: Self-pay

## 2020-08-18 ENCOUNTER — Other Ambulatory Visit: Payer: Self-pay | Admitting: Family Medicine

## 2020-08-18 DIAGNOSIS — Z1231 Encounter for screening mammogram for malignant neoplasm of breast: Secondary | ICD-10-CM

## 2020-08-18 NOTE — Telephone Encounter (Signed)
Spoke to patient. Explained insurance denial and need to trial Omeprazole in order to document success or failure. Patient voiced understanding and agreement with plan. Once patient completes the current prescription of the Rabeprazole, we will start Omeprazole 40mg  QD.

## 2020-08-18 NOTE — Telephone Encounter (Signed)
Please inform patent I have called in Omeprazole. She will need to try this for 4 weeks. If she fails we can complete a prior authorization.

## 2020-08-18 NOTE — Telephone Encounter (Signed)
Spoke to patient. Discussed recommendation for breast cancer screening starting at age 44. She notes that the plastic surgeon has requested a mammogram. Due to this will order. Order placed. Patient instructed to call to schedule.

## 2020-08-18 NOTE — Telephone Encounter (Signed)
Spoke to patient regarding concerns. Discussed plastic surgery recommendations. Recommended she call insurance to determine what criteria is required for procedure to be covered as I am afraid this ma not be covered by insurance. Also discussed obtaining second opinion if she was unhappy with prior recommendations.   Discussed Ozempic dosing. Notes that she is not having any side effects and it is not helping curb her appetite. Recommended she continue the current dose for an additional week. If tolerating, we could consider increasing to 0.5mg  q weekly until follow-up. Will discuss with Dr.Koval and reach back out to patient.

## 2020-08-18 NOTE — Telephone Encounter (Signed)
Patient calls nurse line requesting referral for mammogram. Patient was evaluated by plastic surgeon on Friday, 3/11 and was instructed to follow up with PCP regarding placing a mammogram order.   Please let me know when order has been placed, so I can contact the patient for scheduling.   Talbot Grumbling, RN

## 2020-08-18 NOTE — Telephone Encounter (Signed)
Patient calls nurse line regarding rabeprazole medication. Patient reports that she received a letter in the mail stating that medication is not being covered by insurance. Please see previous note from [facility] regarding insurance determination. Please advise.   Talbot Grumbling, RN

## 2020-08-19 ENCOUNTER — Telehealth: Payer: Self-pay

## 2020-08-19 NOTE — Telephone Encounter (Signed)
Patient calls nurse line requesting to schedule appointment regarding abdominal pain. Patient states that when she presses her arms into stomach she is having pain. Patient also reports intermittent shortness of breath when she does this.   Patient is currently not having pain but would like to have this further evaluated. Patient is not currently experiencing [facility] and is able to speak in complete sentences. Patient denies vomiting or fever.   Scheduled in ATC on Thursday morning. Return precautions given.   Talbot Grumbling, RN

## 2020-08-20 ENCOUNTER — Ambulatory Visit: Payer: Medicare Other | Admitting: Family Medicine

## 2020-08-20 ENCOUNTER — Inpatient Hospital Stay: Admission: RE | Admit: 2020-08-20 | Payer: Medicare Other | Source: Ambulatory Visit

## 2020-08-21 ENCOUNTER — Ambulatory Visit: Payer: Medicare Other

## 2020-08-21 NOTE — Telephone Encounter (Signed)
If patient continues to have inadequate response with the 0.25mg  after 1 week, she can increase to 0.5mg  weekly until follow up.

## 2020-08-22 ENCOUNTER — Ambulatory Visit
Admission: RE | Admit: 2020-08-22 | Discharge: 2020-08-22 | Disposition: A | Discharge status: Home / Self Care | Payer: Medicare Other | Source: Ambulatory Visit | Attending: Family Medicine | Admitting: Family Medicine

## 2020-08-22 ENCOUNTER — Other Ambulatory Visit: Payer: Self-pay

## 2020-08-22 DIAGNOSIS — Z1231 Encounter for screening mammogram for malignant neoplasm of breast: Secondary | ICD-10-CM

## 2020-08-26 NOTE — Progress Notes (Signed)
   Subjective:   Patient ID: Kimberly Browning    DOB: [birth year], 45 y.o. female   MRN: [id number]  Kimberly Browning is a 45 y.o. female with a history of GERD, hypertonicity of bladder, back pain, bipolar disorder, depression/anxiety, elevated serum creatinine, herpes labialis, h/o molar pregnancy, HLD, h/o tobacco abse, HLD, insomnia, IUD in place, neck pain, obesity here for abdominal pain  Abdominal Pain: Patient endorses abdominal pain that occurs all day when she presses against the sides of her abdomen. Pain is worse when she eats. She has been taking the PPI every day. No nausea or vomiting, diarrhea. She has been on Ozempic but notes injecting it in her lower abdomen. Notes pain is primarily along her sides, some in the epigastric area. Denies any fevers or chills. Last bowel movement was last night, unsure of color. Notes some pain with urination and more frequent urination. Has a history of incontinence followed by urologist in past requiring treatment, however due to lack of insurance unable to continue. She endorses frequency, ~4x per hour. This has happened for "a while".   Review of Systems:  Per HPI.   Objective:   BP 120/75   Pulse 73   Ht 5\' 4"  (1.626 m)   Wt 170 lb 3.2 oz (77.2 kg)   LMP 07/31/2020   SpO2 98%   BMI 29.21 kg/m  Vitals and nursing note reviewed.  General: pleasant middle aged woman, sitting comfortably in exam chair, well nourished, well developed, in no acute distress with non-toxic appearance Resp: breathing comfortably on room air, speaking in full sentences Abdomen: soft, tender in epigastric area, some tenderness to deep palpation along lateral aspects of abdomen bilaterally, worse when abdominal muscles engaged ((+) carnette sign), non-distended, no masses or organomegaly palpable, normoactive bowel sounds, no suprapubic pain Skin: warm, dry MSK: gait normal Neuro: Alert and oriented, speech normal  Assessment & Plan:   Urinary incontinence,  mixed Acute on chronic. UA obtained to rule out infection which was unremarkable. Chronic history of incontinence follow with urology in past. Had sacral nueromodulatory device placed in 2016. Possibly removed in 2017?Marland Kitchen Discontinued evaluation due to insurance and difficulties with payment. Recommended follow up with urology for further evaluation.   Abdominal wall pain in flank Acute. Pain to lateral aspects of abdomen bilaterally when patient applies pressure to abdominal wall. No pain when at rest or when no pressure is applied. Exam most consistent with abdominal wall pain. Considered other Gi pathologies (including gallbladder, gastritis, pancreatitis, constipation) and GU (pyelonephritis, nephrolithiasis) however history and physical exam were inconsistent with these. Provided reassurance and recommended supportive care including tylenol/ibuprofen, heating pad, topical analgesics. If no improvement, can consider trigger points in the future. Follow up as scheduled, sooner if worsening. Can consider further labs at that time.  Orders Placed This Encounter  Procedures  . POCT urinalysis dipstick  . POCT UA - Microscopic Only  . POCT urinalysis dipstick   No orders of the defined types were placed in this encounter.     Mina Marble, DO PGY-3, [facility] 08/27/2020 7:07 PM

## 2020-08-27 ENCOUNTER — Encounter: Payer: Self-pay | Admitting: Family Medicine

## 2020-08-27 ENCOUNTER — Ambulatory Visit: Payer: Medicare Other | Admitting: Family Medicine

## 2020-08-27 ENCOUNTER — Ambulatory Visit (INDEPENDENT_AMBULATORY_CARE_PROVIDER_SITE_OTHER): Payer: Medicare Other | Admitting: Family Medicine

## 2020-08-27 ENCOUNTER — Other Ambulatory Visit: Payer: Self-pay

## 2020-08-27 VITALS — BP 120/75 | HR 73 | Ht 64.0 in | Wt 170.2 lb

## 2020-08-27 DIAGNOSIS — N3946 Mixed incontinence: Secondary | ICD-10-CM | POA: Diagnosis not present

## 2020-08-27 DIAGNOSIS — R350 Frequency of micturition: Secondary | ICD-10-CM | POA: Diagnosis not present

## 2020-08-27 DIAGNOSIS — R109 Unspecified abdominal pain: Secondary | ICD-10-CM | POA: Diagnosis not present

## 2020-08-27 LAB — POCT URINALYSIS DIP (MANUAL ENTRY)
Bilirubin, UA: NEGATIVE
Blood, UA: NEGATIVE
Glucose, UA: NEGATIVE mg/dL
Ketones, POC UA: NEGATIVE mg/dL
Nitrite, UA: NEGATIVE
Protein Ur, POC: NEGATIVE mg/dL
Spec Grav, UA: 1.025 (ref 1.010–1.025)
Urobilinogen, UA: 0.2 E.U./dL
pH, UA: 6.5 (ref 5.0–8.0)

## 2020-08-27 LAB — POCT UA - MICROSCOPIC ONLY

## 2020-08-27 NOTE — Patient Instructions (Addendum)
It was a pleasure to see you today!  Thank you for choosing [facility] for your primary care.   Our plans for today were:  I believe your abdominal pain is coming from your abdominal muscles. You can try eating pad, tylenol/ibuprofen as needed, and Capscacin cream and/or votlarn gel as needed. You can also try icy-hot. This should resolve with time. If it persists we can also try trigger point injections  You can increase your ozempic to 0.5mg  once a week. Follow up as scheduled on 09/16/20 for weight loss follow up    Best Wishes,   Mina Marble, DO

## 2020-08-27 NOTE — Assessment & Plan Note (Addendum)
Acute on chronic. UA obtained to rule out infection which was unremarkable. Chronic history of incontinence follow with urology in past. Had sacral nueromodulatory device placed in 2016. Possibly removed in 2017?Marland Kitchen Discontinued evaluation due to insurance and difficulties with payment. Recommended follow up with urology for further evaluation.

## 2020-08-27 NOTE — Assessment & Plan Note (Addendum)
Acute. Pain to lateral aspects of abdomen bilaterally when patient applies pressure to abdominal wall. No pain when at rest or when no pressure is applied. Exam most consistent with abdominal wall pain. Considered other Gi pathologies (including gallbladder, gastritis, pancreatitis, constipation) and GU (pyelonephritis, nephrolithiasis) however history and physical exam were inconsistent with these. Provided reassurance and recommended supportive care including tylenol/ibuprofen, heating pad, topical analgesics. If no improvement, can consider trigger points in the future. Follow up as scheduled, sooner if worsening. Can consider further labs at that time.

## 2020-08-28 ENCOUNTER — Telehealth: Payer: Self-pay | Admitting: *Deleted

## 2020-08-28 NOTE — Telephone Encounter (Signed)
Patient states that since she increased her ozempic she is having nausea and OTC meds are not helping.  She wants to know if she can have something called in by Dr. Tarry Kos.  Advised I would send a message but in the meantime she can try the BRAT diet and small sips of water.  To PCP. Christen Bame, CMA

## 2020-09-01 ENCOUNTER — Other Ambulatory Visit: Payer: Self-pay

## 2020-09-01 MED ORDER — METOCLOPRAMIDE HCL 10 MG PO TABS: 10.0000 mg | ORAL_TABLET | Freq: Four times a day (QID) | ORAL | 0 refills | Status: DC | PRN

## 2020-09-09 ENCOUNTER — Encounter: Payer: Self-pay | Admitting: Physician Assistant

## 2020-09-14 NOTE — Progress Notes (Signed)
   Subjective:   Patient ID: Kimberly Browning    DOB: 10/14/1975, 45 y.o. female   MRN: [id number]  Kimberly Browning is a 45 y.o. female with a history of GERD, onychomycosis, bipolar disorder, depression/anxiety, herpes labialis, h/o tobacco use, HLD, insomnia, IUD contraception, obesity here for weight loss follow up  Weight loss Follow up: Patient has been monitored for weight loss. She was transitioned from Victoza due to persitsent nausea and vomiting. She has been on Ozempic 0.5mg  weekly. She notes she is tolerating it well without any further nausea. She notes that waiting to eat helps with the nausea. Has appointment with GI doctor on 4/22.   Starting weight: 187.7 Last Weight: 170.8lbs Current weight: 163.1lbs  Review of Systems:  Per HPI.   Objective:   BP 100/60   Pulse 80   Ht 5\' 4"  (1.626 m)   Wt 163 lb 2 oz (74 kg)   SpO2 99%   BMI 28.00 kg/m  Vitals and nursing note reviewed.  General: pleasant middle aged woman, sitting comfortably in exam chair, well nourished, well developed, in no acute distress with non-toxic appearance Resp: breathing comfortably on room air, speaking in full sentences MSK:  gait normal Neuro: Alert and oriented, speech normal  Assessment & Plan:   Obesity (BMI 30.0-34.9) Chronic, improving. Has lost an additional 7lbs since last visit, 24lbs total since initiation of treatment. Discussed importance of slow and steady weight loss rather than rapid weight loss as this would be unhealthy and places her at risk for rebound weight gain. She is tolerating Ozempic 0.5mg  q weekly. Plan initially was to continue Ozempic at 0.5mg  q weekly however given limited supply will transition to Trulicity as this is covered by insurance. History of intolerance to Victoza. Continued to encourage 30 minutes of moderate intensity exercise daily and healthy eating, portion control, and limiting carbohydrates. Follow up scheduled for 10/20/20.   Abdominal wall pain in  flank Continues to endorse intermittent abdominal wall pain in bilateral obliques/flank when abdominal wall is palpated.  No pain when not palpated. She has tried heating pad and topical capscacin. Denies any nausea, vomiting, diarrhea. Has GI appointment on 4/22 - opted to wait to discuss with GI prior to further changes in management. Can consider trigger point injection if persists.   No orders of the defined types were placed in this encounter.  No orders of the defined types were placed in this encounter.    Mina Marble, DO PGY-3, [facility] 09/18/2020 10:07 PM

## 2020-09-16 ENCOUNTER — Encounter: Payer: Self-pay | Admitting: Family Medicine

## 2020-09-16 ENCOUNTER — Ambulatory Visit (INDEPENDENT_AMBULATORY_CARE_PROVIDER_SITE_OTHER): Payer: Medicare Other | Admitting: Family Medicine

## 2020-09-16 ENCOUNTER — Other Ambulatory Visit: Payer: Self-pay

## 2020-09-16 DIAGNOSIS — R109 Unspecified abdominal pain: Secondary | ICD-10-CM

## 2020-09-16 DIAGNOSIS — E669 Obesity, unspecified: Secondary | ICD-10-CM

## 2020-09-16 NOTE — Patient Instructions (Signed)
It was a pleasure to see you today!  Thank you for choosing [facility] for your primary care.   Our plans for today were:  Continue Ozempic 0.5mg  weekly until we follow up  Best Wishes,   Mina Marble, DO

## 2020-09-17 ENCOUNTER — Telehealth: Payer: Self-pay

## 2020-09-17 MED ORDER — TRULICITY 0.75 MG/0.5ML ~~LOC~~ SOAJ: 0.7500 mg | SUBCUTANEOUS | 1 refills | Status: DC

## 2020-09-17 MED ORDER — OZEMPIC (0.25 OR 0.5 MG/DOSE) 2 MG/1.5ML ~~LOC~~ SOPN: 0.5000 mg | PEN_INJECTOR | SUBCUTANEOUS | 0 refills | Status: DC

## 2020-09-17 NOTE — Telephone Encounter (Signed)
Patient calls nurse line requesting help injecting Trulicty. I talked through the injection with her and offered a YouTube site so she could visualize. Patient to call back if she feels she can not do it.

## 2020-09-17 NOTE — Addendum Note (Signed)
Addended by: Hughes Better on: 09/17/2020 01:21 PM   Modules accepted: Orders

## 2020-09-17 NOTE — Telephone Encounter (Signed)
Patient calls nurse line regarding issues with Ozempic pen. Patient reports that when she tries to dial the pen to the correct dosage it will not click into place, however, medication is squirting out of pen.   Will forward to [facility] for further trouble shooting. Patient is concerned as she was told we have limited samples.   Please return call to patient at [place], RN

## 2020-09-17 NOTE — Telephone Encounter (Signed)
Reached out to patient regarding issue with Ozempic pen and upon further discussion it sounds like patient may be out of medication which is why the pen will not dial up to 0.5mg . Patient took four doses of 0.25mg  and increased last week up to 0.5mg . Today would have been her 6th dose of Ozempic (2nd dose of 0.5mg ). Pen should have lasted until today but perhaps she lost some of the medication at some point.  When discussing this medication with patient it became clear that Ozempic will likely not be covered by her insurance. Patient has Medicare A+B and Medicaid. Medicaid prefers Victoza and Trulicity. Patient previously did not tolerate Victoza therefore will send in prescription for Trulicity. Will follow-up with pharmacy first to verify medication coverage and then reach back out to patient afterwards.

## 2020-09-17 NOTE — Telephone Encounter (Signed)
Called pharmacy and confirmed that Trulicity is covered. Cost is $4 for a 30 day supply. Reached back out to patient to let her know about coverage for Trulicity. Extensively counseled patient on proper administration of Trulicity. Instructed patient to switch to Trulicity today, as today would have been her next dose of Ozempic.  Patient had some additional confusion regarding administration of Trulicity despite many efforts to explain proper use. Instructed patient to ask the pharmacist when she picks up the medication so they may also explain it to her in person. Patient agreeable to plan. She will pick up and administer Trulicity today.

## 2020-09-17 NOTE — Telephone Encounter (Signed)
[facility] ATTENDING NOTE Ronette Deter I agree with the clinical pharmacist's findings, assessment and care plan.

## 2020-09-17 NOTE — Telephone Encounter (Signed)
Patient returns call to nurse line requesting refill on Ozempic. Patient is requesting that refill be sent in today as soon as possible, as she would like to keep her Ozempic on Wednesdays. Informed patient of 24-48 hour rx refill policy.   Talbot Grumbling, RN

## 2020-09-18 NOTE — Assessment & Plan Note (Signed)
Chronic, improving. Has lost an additional 7lbs since last visit, 24lbs total since initiation of treatment. Discussed importance of slow and steady weight loss rather than rapid weight loss as this would be unhealthy and places her at risk for rebound weight gain. She is tolerating Ozempic 0.5mg  q weekly. Plan initially was to continue Ozempic at 0.5mg  q weekly however given limited supply will transition to Trulicity as this is covered by insurance. History of intolerance to Victoza. Continued to encourage 30 minutes of moderate intensity exercise daily and healthy eating, portion control, and limiting carbohydrates. Follow up scheduled for 10/20/20.

## 2020-09-18 NOTE — Assessment & Plan Note (Signed)
Continues to endorse intermittent abdominal wall pain in bilateral obliques/flank when abdominal wall is palpated.  No pain when not palpated. She has tried heating pad and topical capscacin. Denies any nausea, vomiting, diarrhea. Has GI appointment on 4/22 - opted to wait to discuss with GI prior to further changes in management. Can consider trigger point injection if persists.

## 2020-09-26 ENCOUNTER — Encounter: Payer: Self-pay | Admitting: Physician Assistant

## 2020-09-26 ENCOUNTER — Ambulatory Visit (INDEPENDENT_AMBULATORY_CARE_PROVIDER_SITE_OTHER): Payer: Medicare Other | Admitting: Physician Assistant

## 2020-09-26 VITALS — BP 110/80 | HR 84 | Ht 63.75 in | Wt 161.4 lb

## 2020-09-26 DIAGNOSIS — R1319 Other dysphagia: Secondary | ICD-10-CM

## 2020-09-26 NOTE — Progress Notes (Signed)
Subjective:    Patient ID: Kimberly Browning, female    DOB: 07-31-1975, 45 y.o.   MRN: [id number]  HPI [name] is a pleasant 45 year old white female, new to GI today, self-referred with complaints of dysphagia.  She has not had any prior GI evaluation.  She does have history of GERD for which she has been taking Aciphex.  Also with bipolar disorder and depression and history of obesity for which she is being treated currently with Trulicity. Patient says her current symptoms started in February 2022 she started noticing that her food felt like it was sticking or stopping in her esophagus.  This has been occurring frequently since.  She denies any episodes requiring regurgitation or vomiting.  No difficulty with liquids.  She does have discomfort with episodes of dysphagia, says food eventually will go on down.  No significant heartburn symptoms as long as she is on her medication.  She is also complaining of some discomfort in the epigastrium. She has lost about 25 pounds over the past 6 months but most of this has been intentional.  No complaints of melena or hematochezia. No regular aspirin or NSAIDs Labs done 08/2020 with normal c-Met and CBC.  Review of Systems.Pertinent positive and negative review of systems were noted in the above HPI section.  All other review of systems was otherwise negative.  Outpatient Encounter Medications as of 09/26/2020  Medication Sig  . ALPRAZolam (XANAX) 1 MG tablet Take 1 mg by mouth 3 (three) times daily as needed.  Marland Kitchen amantadine (SYMMETREL) 100 MG capsule Take 100 mg by mouth daily.  . BELSOMRA 20 MG TABS Take 1 tablet by mouth at bedtime.  . Dulaglutide (TRULICITY) 5.80 DX/8.3JA SOPN Inject 0.75 mg into the skin once a week.  . Insulin Pen Needle (BD PEN NEEDLE NANO U/F) 32G X 4 MM MISC 1 application by Does not apply route daily.  Marland Kitchen lamoTRIgine (LAMICTAL) 200 MG tablet Take 300 mg by mouth daily.  . metoCLOPramide (REGLAN) 10 MG tablet Take 1 tablet (10 mg  total) by mouth every 6 (six) hours as needed for nausea or vomiting.  . mirtazapine (REMERON) 30 MG tablet Take 30 mg by mouth at bedtime.  . paragard intrauterine copper IUD IUD 1 each by Intrauterine route once.  . temazepam (RESTORIL) 15 MG capsule Take 3 capsules by mouth daily.  . [DISCONTINUED] lamoTRIgine (LAMICTAL) 100 MG tablet Take 100 mg by mouth at bedtime.  . [DISCONTINUED] omeprazole (PRILOSEC) 20 MG capsule Take 1 capsule (20 mg total) by mouth daily. Can increase to 2 tablets daily (16m) if continued symptoms.   No facility-administered encounter medications on file as of 09/26/2020.   Allergies  Allergen Reactions  . Geodon [Ziprasidone Hydrochloride] Shortness Of Breath and Swelling  . Hibiclens [Chlorhexidine] Itching    Redness and itching after using CHG wipes  . Sulfamethoxazole-Trimethoprim Other (See Comments)    hallucinations  . Doxycycline Other (See Comments)    Unknown reaction  . Gabapentin Other (See Comments)    Unknown reaction  . Other Other (See Comments)    Bath and body works body wash and lotion caused itching, reddness and rash  . Penicillins Other (See Comments)    Unknown reaction Did it involve swelling of the face/tongue/throat, SOB, or low BP? Unknown Did it involve sudden or severe rash/hives, skin peeling, or any reaction on the inside of your mouth or nose? Unknown Did you need to seek medical attention at a hospital or doctor's office?  Unknown When did it last happen?unknown If all above answers are "NO", may proceed with cephalosporin use.  . Promethazine Other (See Comments)  . Depakote [Divalproex Sodium] Rash   Patient Active Problem List   Diagnosis Date Noted  . Abdominal wall pain in flank 08/27/2020  . Back pain 08/15/2020  . Neck pain 08/15/2020  . Symptomatic mammary hypertrophy 08/15/2020  . Elevated serum creatinine 07/17/2020  . IUD contraception 03/18/2020  . Onychomycosis of left great toe 10/24/2019  .  Obesity (BMI 30.0-34.9) 08/24/2019  . Depression with anxiety   . Gastroesophageal reflux disease without esophagitis 01/29/2016  . 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 History of tobacco use 06/09/2009  . History of molar pregnancy, antepartum 12/12/2008  . INSOMNIA 12/11/2008  . 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 Hyperlipidemia 01/03/2007  . Urinary incontinence, mixed 11/25/2006  . BIPOLAR DISORDER 08/04/2006   Social History   Socioeconomic History  . Marital status: Divorced    Spouse name: Not on file  . Number of children: 1  . Years of education: Not on file  . Highest education level: Not on file  Occupational History  . Occupation: disablitiy  Tobacco Use  . Smoking status: Former Smoker    Packs/day: 0.25    Years: 19.00    Pack years: 4.75    Types: Cigarettes    Quit date: 09/23/2019    Years since quitting: 1.0  . Smokeless tobacco: Never Used  . Tobacco comment:  1 pp3d  Vaping Use  . Vaping Use: Never used  Substance and Sexual Activity  . Alcohol use: No  . Drug use: No  . Sexual activity: Yes    Birth control/protection: None  Other Topics Concern  . Not on file  Social History Narrative  . Not on file   Social Determinants of Health   Financial Resource Strain: Not on file  Food Insecurity: No Food Insecurity  . Worried About Charity fundraiser in the Last Year: Never true  . Ran Out of Food in the Last Year: Never true  Transportation Needs: No Transportation Needs  . Lack of Transportation (Medical): No  . Lack of Transportation (Non-Medical): No  Physical Activity: Not on file  Stress: Not on file  Social Connections: Not on file  Intimate Partner Violence: Not on file    Ms. Behne's family history is not on file. She was adopted.      Objective:    Vitals:   09/26/20 1116  BP: 110/80  Pulse: 84    Physical Exam Well-developed well-nourished female in no acute distress.  Height, Weight, 161 BMI 27.9  HEENT; nontraumatic  normocephalic, EOMI, PE R LA, sclera anicteric. Oropharynx; not examined today Neck; supple, no JVD Cardiovascular; regular rate and rhythm with S1-S2, no murmur rub or gallop Pulmonary; Clear bilaterally Abdomen; soft, she has some tenderness high in the epigastrium and subxiphoid area nondistended, no palpable mass or hepatosplenomegaly, bowel sounds are active Rectal; not done today Skin; benign exam, no jaundice rash or appreciable lesions Extremities; no clubbing cyanosis or edema skin warm and dry Neuro/Psych; alert and oriented x4, grossly nonfocal mood and affect appropriate      Assessment & Plan:   #22 45 year old white female with 2 to 44-monthhistory of solid food dysphagia, with progressive symptoms and frequent episodes.  Patient has history of GERD for which she has been on Aciphex over the past year. Rule out esophageal stricture, esophageal ring, consider eosinophilic esophagitis, rule out neoplasm  #  2  Obesity-on Trulicity with good response #3 bipolar disorder and depression  Plan; continue Aciphex 1 p.o. every morning Patient will be scheduled for EGD with possible esophageal dilation with Dr. Ardis Hughs.  Procedure was discussed in detail with the patient including indications risk and benefits and she is agreeable to proceed. She was advised to consume a very soft diet and avoid larger chunks of meat.  Advise she cut her food into very small pieces and chew carefully with sips of liquids between bites. Further recommendations pending findings at EGD.  Sie Formisano S Babygirl Trager PA-C 09/26/2020   Cc: Danna Hefty, DO

## 2020-09-26 NOTE — Patient Instructions (Signed)
If you are age 44 or older, your body mass index should be between 23-30. Your Body mass index is 27.92 kg/m. If this is out of the aforementioned range listed, please consider follow up with your Primary Care Provider.  If you are age 42 or younger, your body mass index should be between 19-25. Your Body mass index is 27.92 kg/m. If this is out of the aformentioned range listed, please consider follow up with your Primary Care Provider.   You have been scheduled for an endoscopy. Please follow written instructions given to you at your visit today. If you use inhalers (even only as needed), please bring them with you on the day of your procedure.  Cut up food into small pieces and chew carefully. Sip fluids between bites.  Follow up pending the results of your Endoscopy.  Thank you for entrusting me with your care and choosing [facility].  Amy Esterwood, PA-C

## 2020-09-26 NOTE — Progress Notes (Signed)
I agree with the above note, plan 

## 2020-10-02 ENCOUNTER — Telehealth: Payer: Self-pay

## 2020-10-02 NOTE — Telephone Encounter (Signed)
Patient calls nurse line requesting to up her Trulicity dosage. Patient states she tolerated last weeks dose and this weeks dose well. Patient reports she was told she could up her dose if she was tolerating the medication. Patient due for next injection next Wednesday. Will forward to PCP.

## 2020-10-06 ENCOUNTER — Ambulatory Visit (AMBULATORY_SURGERY_CENTER): Payer: Medicare Other | Admitting: Gastroenterology

## 2020-10-06 ENCOUNTER — Other Ambulatory Visit: Payer: Self-pay

## 2020-10-06 ENCOUNTER — Encounter: Payer: Self-pay | Admitting: Gastroenterology

## 2020-10-06 VITALS — BP 121/73 | HR 85 | Temp 97.8°F | Resp 21 | Ht 63.75 in | Wt 161.0 lb

## 2020-10-06 DIAGNOSIS — K2970 Gastritis, unspecified, without bleeding: Secondary | ICD-10-CM

## 2020-10-06 DIAGNOSIS — R1319 Other dysphagia: Secondary | ICD-10-CM

## 2020-10-06 DIAGNOSIS — K319 Disease of stomach and duodenum, unspecified: Secondary | ICD-10-CM

## 2020-10-06 DIAGNOSIS — K219 Gastro-esophageal reflux disease without esophagitis: Secondary | ICD-10-CM

## 2020-10-06 MED ORDER — SODIUM CHLORIDE 0.9 % IV SOLN: 500.0000 mL | Freq: Once | INTRAVENOUS | Status: DC

## 2020-10-06 NOTE — Patient Instructions (Signed)
HANDOUTS PROVIDED ON: GASTRITIS  The biopsies taken today have been sent for pathology.  The results can take 1-3 weeks to receive.  When your next colonoscopy should occur will be based on the pathology results.    You may resume your previous diet and medication schedule.  Thank you for allowing [place] to care for you today!!!   YOU HAD AN ENDOSCOPIC PROCEDURE TODAY AT [facility]:   Refer to the procedure report that was given to you for any specific questions about what was found during the examination.  If the procedure report does not answer your questions, please call your gastroenterologist to clarify.  If you requested that your care partner not be given the details of your procedure findings, then the procedure report has been included in a sealed envelope for you to review at your convenience later.  YOU SHOULD EXPECT: Some feelings of bloating in the abdomen. Passage of more gas than usual.  Walking can help get rid of the air that was put into your GI tract during the procedure and reduce the bloating.   Please Note:  You might notice some irritation and congestion in your nose or some drainage.  This is from the oxygen used during your procedure.  There is no need for concern and it should clear up in a day or so.  SYMPTOMS TO REPORT IMMEDIATELY:   Following upper endoscopy (EGD)  Vomiting of blood or coffee ground material  New chest pain or pain under the shoulder blades  Painful or persistently difficult swallowing  New shortness of breath  Fever of 100F or higher  Black, tarry-looking stools  For urgent or emergent issues, a gastroenterologist can be reached at any hour by calling [phone]. Do not use MyChart messaging for urgent concerns.    DIET:  We do recommend a small meal at first, but then you may proceed to your regular diet.  Drink plenty of fluids but you should avoid alcoholic beverages for 24 hours.  ACTIVITY:  You should plan to take  it easy for the rest of today and you should NOT DRIVE or use heavy machinery until tomorrow (because of the sedation medicines used during the test).    FOLLOW UP: Our staff will call the number listed on your records Wednesday morning between 7:15 am and 8:15 am following your procedure to check on you and address any questions or concerns that you may have regarding the information given to you following your procedure. If we do not reach you, we will leave a message.  We will attempt to reach you two times.  During this call, we will ask if you have developed any symptoms of COVID 19. If you develop any symptoms (ie: fever, flu-like symptoms, shortness of breath, cough etc.) before then, please call [phone].  If you test positive for Covid 19 in the 2 weeks post procedure, please call and report this information to [place].    If any biopsies were taken you will be contacted by phone or by letter within the next 1-3 weeks.  Please call us at [phone] if you have not heard about the biopsies in 3 weeks.    SIGNATURES/CONFIDENTIALITY: You and/or your care partner have signed paperwork which will be entered into your electronic medical record.  These signatures attest to the fact that that the information above on your After Visit Summary has been reviewed and is understood.  Full responsibility of the confidentiality of this discharge  information lies with you and/or your care-partner. 

## 2020-10-06 NOTE — Progress Notes (Signed)
Report to PACU, RN, vss, BBS= Clear.  

## 2020-10-06 NOTE — Op Note (Signed)
[facility] Patient Name: Kimberly Browning Procedure Date: 10/06/2020 9:24 AM MRN: [id number] Endoscopist: Milus Banister , MD Age: 45 Referring MD:  Date of Birth: Jan 14, 1976 Gender: Female Account #: [id number] Procedure:                Upper GI endoscopy Indications:              Dysphagia, Heartburn (pyrosis is well controlled on                            PPI once daily) Medicines:                Monitored Anesthesia Care Procedure:                Pre-Anesthesia Assessment:                           - Prior to the procedure, a History and Physical                            was performed, and patient medications and                            allergies were reviewed. The patient's tolerance of                            previous anesthesia was also reviewed. The risks                            and benefits of the procedure and the sedation                            options and risks were discussed with the patient.                            All questions were answered, and informed consent                            was obtained. Prior Anticoagulants: The patient has                            taken no previous anticoagulant or antiplatelet                            agents. ASA Grade Assessment: II - A patient with                            mild systemic disease. After reviewing the risks                            and benefits, the patient was deemed in                            satisfactory condition to undergo the procedure.  After obtaining informed consent, the endoscope was                            passed under direct vision. Throughout the                            procedure, the patient's blood pressure, pulse, and                            oxygen saturations were monitored continuously. The                            Endoscope was introduced through the mouth, and                            advanced to the second part of duodenum.  The upper                            GI endoscopy was accomplished without difficulty.                            The patient tolerated the procedure well. Scope In: Scope Out: Findings:                 Mild inflammation characterized by erythema and                            granularity was found in the gastric antrum.                            Biopsies were taken with a cold forceps for                            histology.                           The exam was otherwise without abnormality.                           Normal esophagus was biopsied (distal and proxima)                            to check for EoE. Complications:            No immediate complications. Estimated blood loss:                            None. Estimated Blood Loss:     Estimated blood loss: none. Impression:               - Mild, non-specific gastritis. Biopsied to check                            for H. pylori.                           -  Normal esophagus. Biopsied to check for                            Eosinophilic Esophagitis.                           - The examination was otherwise normal. Recommendation:           - Patient has a contact number available for                            emergencies. The signs and symptoms of potential                            delayed complications were discussed with the                            patient. Return to normal activities tomorrow.                            Written discharge instructions were provided to the                            patient.                           - Resume previous diet.                           - Continue present medications.                           - Await pathology results. Milus Banister, MD 10/06/2020 9:51:19 AM This report has been signed electronically.

## 2020-10-06 NOTE — Progress Notes (Signed)
Pt's states no medical or surgical changes since previsit or office visit. 

## 2020-10-06 NOTE — Progress Notes (Signed)
Called to room to assist during endoscopic procedure.  Patient ID and intended procedure confirmed with present staff. Received instructions for my participation in the procedure from the performing physician.  

## 2020-10-07 ENCOUNTER — Telehealth: Payer: Self-pay | Admitting: Gastroenterology

## 2020-10-07 NOTE — Telephone Encounter (Signed)
Spoke with pt who states, "I have been vomiting all night, not able to keep any foods down.  Barely able to keep fluids down."   Pt states she had this prior to her procedure, this is not new for her.  No other issues such as abdominal pain. She has had this issue "for as long as I can remember."  Dr. Ardis Hughs, Please advise.  Anything you would like me to tell her to do?  Thanks, [employer]

## 2020-10-07 NOTE — Telephone Encounter (Signed)
Seems unrelated to her EGD. Will see what the gastritis biopsies show and take it from there.  Thanks

## 2020-10-07 NOTE — Telephone Encounter (Signed)
Pt made aware of Dr. Ardis Hughs' recommendations and understanding voiced

## 2020-10-07 NOTE — Telephone Encounter (Signed)
Inbound call from patient requesting a call from a nurse stating she has been vomiting since she had her procedure.

## 2020-10-08 ENCOUNTER — Telehealth: Payer: Self-pay | Admitting: *Deleted

## 2020-10-08 ENCOUNTER — Telehealth: Payer: Self-pay

## 2020-10-08 NOTE — Telephone Encounter (Signed)
Patient call again today.  She now wants to know if a new script can be sent in with a lower dosage of trulicity because it is making her nauseated and vomiting after eating.  Verified that she did ask for an increase last week and she confirms.  To PCP. Christen Bame, CMA

## 2020-10-08 NOTE — Telephone Encounter (Signed)
Got it, thanks!

## 2020-10-08 NOTE — Telephone Encounter (Signed)
  Follow up Call-  Call back number 10/06/2020  Post procedure Call Back phone  # [phone]  Permission to leave phone message Yes  Some recent data might be hidden     Patient questions:  Do you have a fever, pain , or abdominal swelling? No. Pain Score  0 *  Have you tolerated food without any problems? Yes.    Have you been able to return to your normal activities? Yes.    Do you have any questions about your discharge instructions: Diet   No. Medications  Yes.   Follow up visit  No.  Do you have questions or concerns about your Care? Yes.     Patient states she had vomiting and called [facility] yesterday. I viewed the recommendations and note from Dr. Ardis Hughs regarding that phone call and reiterated his advice. Patient verbalizes understanding and states she feels a little better today. Patient states she was due to take her insulin yesterday and didn't take it because she wasn't sure if she should wait until her biopsy results came back to take it. I instructed her that her procedure report states to resume her current medications and that it was important for her to follow up with her PMD, provider that prescribes her insulin, if she had any questions regarding her insulin regimen. Pt. Verbalizes understanding and states she will do so. Message routed to Dr. Ardis Hughs to inform him of above.   Actions: * If pain score is 4 or above: No action needed, pain <4.  1. Have you developed a fever since your procedure? no  2.   Have you had an respiratory symptoms (SOB or cough) since your procedure? no  3.   Have you tested positive for COVID 19 since your procedure no  4.   Have you had any family members/close contacts diagnosed with the COVID 19 since your procedure?  no   If yes to any of these questions please route to Joylene John, RN and Joella Prince, RN

## 2020-10-08 NOTE — Telephone Encounter (Signed)
Patient calls nurse line requesting refill on acid reflux medication. Patient reports that current medication is not being covered by insurance. Per chart review, it appears that patient has to trial and fail omeprazole 40 mg tablets before approving Rabeprazole.   Please advise if omeprazole can be sent into pharmacy for patient.   Talbot Grumbling, RN

## 2020-10-09 ENCOUNTER — Other Ambulatory Visit: Payer: Self-pay | Admitting: Family Medicine

## 2020-10-09 DIAGNOSIS — K219 Gastro-esophageal reflux disease without esophagitis: Secondary | ICD-10-CM

## 2020-10-09 MED ORDER — PANTOPRAZOLE SODIUM 40 MG PO TBEC: 40.0000 mg | DELAYED_RELEASE_TABLET | Freq: Every day | ORAL | 0 refills | Status: DC

## 2020-10-09 NOTE — Telephone Encounter (Signed)
Spoke to patient. She continues to have nausea/vomiting after the 0.75mg  Trulicity dose and improve by the following dose. Informed her that there is not a lower dose of this medication. Recommended she discontinue the Trulicity at this time and follow up on 5/16 as scheduled to discuss. Given medication is being used solely for weight loss will need to discuss alternative plan given inability to tolerate. Patient voiced understanding and agreement with plan.

## 2020-10-09 NOTE — Telephone Encounter (Signed)
Discussed with patient. See alternative phone encounter.

## 2020-10-09 NOTE — Telephone Encounter (Signed)
Will route back to PCP as this is not on current medication list.   Talbot Grumbling, RN

## 2020-10-09 NOTE — Telephone Encounter (Signed)
Patient is followed by GI. She is supposed to be on Rabeprazole prescribed by GI. Unclear what they would prefer her to be on. Called in 1 month supply of Protonix 40mg  QD and recommended she called GI to discuss. She voiced understanding and agreement with plan.

## 2020-10-09 NOTE — Telephone Encounter (Signed)
Yes Omeprazole can be called in with 2 refills. Thank you.

## 2020-10-10 ENCOUNTER — Other Ambulatory Visit: Payer: Self-pay

## 2020-10-10 MED ORDER — METOCLOPRAMIDE HCL 10 MG PO TABS: 10.0000 mg | ORAL_TABLET | Freq: Four times a day (QID) | ORAL | 0 refills | Status: DC | PRN

## 2020-10-13 ENCOUNTER — Other Ambulatory Visit: Payer: Self-pay

## 2020-10-13 DIAGNOSIS — K219 Gastro-esophageal reflux disease without esophagitis: Secondary | ICD-10-CM

## 2020-10-13 MED ORDER — PANTOPRAZOLE SODIUM 40 MG PO TBEC
40.0000 mg | DELAYED_RELEASE_TABLET | Freq: Two times a day (BID) | ORAL | 6 refills | Status: DC
Start: 2020-10-13 — End: 2020-11-12

## 2020-10-15 NOTE — Progress Notes (Signed)
   Subjective:   Patient ID: Kimberly Browning    DOB: 25-Oct-1975, 45 y.o. female   MRN: [id number]  Kimberly Browning is a 45 y.o. female with a history of  GERD, onychomycosis, bipolar disorder, depression/anxiety, herpes labialis, h/o tobacco use, HLD, insomnia, IUD contraception, obesity here for continued vomiting.  HPI: sister present with patient today due to concern for continued vomiting after "every meal". She notes that it feels like her food is "getting stuck" most of the way down (pointing to mid chest). Denies difficulty with liquids. She starts to burp a bunch and then vomits a small amount of food. Denies predisposing nausea. Followed by GI.  recent endoscopy was essentially normal. Currently on Protonix BID with plan to follow up in 2 months with GI. Sometimes can tolerate some meals. Denies any type of abdominal pain. Denies diarrhea or constipation.   Review of Systems:  Per HPI.   Objective:   BP 122/84   Pulse 88   Wt 157 lb 3.2 oz (71.3 kg)   SpO2 96%   BMI 27.20 kg/m  Vitals and nursing note reviewed.  General: pleasant older female, sitting comfortably in exam chair with sister at her side, well nourished, well developed, in no acute distress with non-toxic appearance Resp: breathing comfortably on room air, speaking in full sentences MSK: gait normal Neuro: Alert and oriented, speech normal  Assessment & Plan:   Vomiting without nausea Chronic, since February 2022. Unclear etiology. Does continue to lose weight despite discontinuation of GLP-1. Recent EGD normal. H. Pylori negative. Recently started on BID PPI, 1 week into treatment. Emesis appears to follow excessive burping leading to small amount of NBNB emesis. This does seem consistent with GERD. Other etiologies considered include esophageal dysmotility vs gastroparesis? Vs functional dysphagia. Wonder if she would benefit from barium swallow/manometry. Will reach out to GI inquire there thoughts regarding.  Low  suspicion for infectious etiology given duration. Normal bowel movements thus unlikely obstruction. Lack of abdominal pain makes organic GI disorder seem less likely as well. No history of illicit drug use.  Plan with GI is to follow up in 2 months from last visit to evaluate for improvement after BID PPI. Overall patient is well appearing and hemodynamically stable. Plan is to continue with current treatment plan and follow up in 2 weeks to allow time for PPI to improve sypmtoms. If no improvement in symptoms +/- continued weight loss will plan to obtain lab work (CMP, TSH, u-preg, etc).   No orders of the defined types were placed in this encounter.  No orders of the defined types were placed in this encounter.     Mina Marble, DO PGY-3, [facility] 10/17/2020 7:56 PM

## 2020-10-16 ENCOUNTER — Other Ambulatory Visit: Payer: Self-pay

## 2020-10-16 ENCOUNTER — Ambulatory Visit (INDEPENDENT_AMBULATORY_CARE_PROVIDER_SITE_OTHER): Payer: Medicare Other | Admitting: Family Medicine

## 2020-10-16 ENCOUNTER — Encounter: Payer: Self-pay | Admitting: Family Medicine

## 2020-10-16 VITALS — BP 122/84 | HR 88 | Wt 157.2 lb

## 2020-10-16 DIAGNOSIS — R1111 Vomiting without nausea: Secondary | ICD-10-CM

## 2020-10-16 NOTE — Patient Instructions (Signed)
Please continue the protonix twice a day Follow up in 2 weeks if your symptoms have not improved any or if you continue to lose weight unintentionally.  If it much worse, please follow up and we will obtain further work up.

## 2020-10-17 DIAGNOSIS — R1111 Vomiting without nausea: Secondary | ICD-10-CM | POA: Insufficient documentation

## 2020-10-17 NOTE — Assessment & Plan Note (Addendum)
Chronic, since February 2022. Unclear etiology. Does continue to lose weight despite discontinuation of GLP-1. Recent EGD normal. H. Pylori negative. Recently started on BID PPI, 1 week into treatment. Emesis appears to follow excessive burping leading to small amount of NBNB emesis. This does seem consistent with GERD. Other etiologies considered include esophageal dysmotility vs gastroparesis? Vs functional dysphagia. Wonder if she would benefit from barium swallow/manometry. Will reach out to GI inquire there thoughts regarding.  Low suspicion for infectious etiology given duration. Normal bowel movements thus unlikely obstruction. Lack of abdominal pain makes organic GI disorder seem less likely as well. No history of illicit drug use.  Plan with GI is to follow up in 2 months from last visit to evaluate for improvement after BID PPI. Overall patient is well appearing and hemodynamically stable. Plan is to continue with current treatment plan and follow up in 2 weeks to allow time for PPI to improve sypmtoms. If no improvement in symptoms +/- continued weight loss will plan to obtain lab work (CMP, TSH, u-preg, etc).

## 2020-10-20 ENCOUNTER — Telehealth: Payer: Self-pay

## 2020-10-20 ENCOUNTER — Ambulatory Visit: Payer: Medicare Other | Admitting: Family Medicine

## 2020-10-20 NOTE — Telephone Encounter (Signed)
-----   Message from Alfredia Ferguson, PA-C sent at 10/20/2020 10:20 AM EDT ----- Regarding: office follow up Dr. Tarry Kos- thanks for the follow-up  Beth, please get patient scheduled for a follow-up office visit with me, will reassess and decide regarding need for further work-up of potential esophageal dysmotility ----- Message ----- From: Danna Hefty, DO Sent: 10/17/2020   7:51 PM EDT To: Alfredia Ferguson, PA-C  Hi Amy,  You have been helping care for a mutual patient of mine, Ms. Dorian Pod. She followed up recently with continued "vomiting" without nausea and dysphagia. Fortunately her EGD was normal and plan as of now is to do a 2 month trial of PPI. She followed up with me recently due to continued symptoms and unintentional weight loss (now off the GLP-1). I recommended to continue the PPI BID as recommended and follow up with me in 2 week to see how things are and evaluate her weight. I plan to obtain some labs then if things continue. However, I was wondering what your thoughts were regarding evaluation for esophageal dysmotility?   Thanks for your time and help with Ms. Ivin Booty.  Dr. Tarry Kos

## 2020-10-21 NOTE — Telephone Encounter (Signed)
Spoke with the patient. Scheduled first available on 11/12/20 at 9:00 am

## 2020-11-12 ENCOUNTER — Encounter: Payer: Self-pay | Admitting: Physician Assistant

## 2020-11-12 ENCOUNTER — Ambulatory Visit (INDEPENDENT_AMBULATORY_CARE_PROVIDER_SITE_OTHER): Payer: Medicare Other | Admitting: Physician Assistant

## 2020-11-12 VITALS — BP 100/70 | HR 80 | Ht 63.0 in | Wt 158.0 lb

## 2020-11-12 DIAGNOSIS — K219 Gastro-esophageal reflux disease without esophagitis: Secondary | ICD-10-CM | POA: Diagnosis not present

## 2020-11-12 DIAGNOSIS — R1310 Dysphagia, unspecified: Secondary | ICD-10-CM | POA: Diagnosis not present

## 2020-11-12 MED ORDER — OMEPRAZOLE 40 MG PO CPDR: 40.0000 mg | DELAYED_RELEASE_CAPSULE | Freq: Every day | ORAL | 11 refills | Status: DC

## 2020-11-12 NOTE — Progress Notes (Signed)
Subjective:    Patient ID: Kimberly Browning, female    DOB: February 22, 1976, 45 y.o.   MRN: [id number]  HPI Kimberly Browning is a 45 year old female, recently established and seen by myself on 09/26/2020 and now established with Kimberly Browning.  She presented with complaints of dysphagia since February 2022, and some epigastric discomfort.  She had lost about 25 pounds over the prior 6 months but had been on Trulicity for weight loss.  Patient underwent EGD on 10/06/2020 which showed a normal-appearing esophagus and very mild gastritis.  Biopsies were done of the esophagus that showed no evidence of eosinophilic esophagitis there were reflux changes noted, biopsies of the stomach showed reactive gastropathy no H. pylori. She has been on twice daily Protonix since that time. She comes in today for follow-up stating that she has developed a very fine rash on her legs which she thinks might be secondary to Protonix.  On further discussion she is not having any heartburn or indigestion symptoms and says that she has done well on once daily PPI therapy in the past.  She had been on Aciphex which is working very well previously but insurance would no longer cover. She is currently off the Trulicity and her weight has been stable. Unfortunately she still having some complaints of dysphagia.  She says liquids are not an issue but she is still having symptoms with solid food and feels as if her food is very slow to traverse into her stomach and some of her larger pills are also causing difficulty with dysphagia.  Review of Systems Pertinent positive and negative review of systems were noted in the above HPI section.  All other review of systems was otherwise negative.  Outpatient Encounter Medications as of 11/12/2020  Medication Sig  . ALPRAZolam (XANAX) 1 MG tablet Take 1 mg by mouth 3 (three) times daily as needed.  Marland Kitchen amantadine (SYMMETREL) 100 MG capsule Take 100 mg by mouth daily.  . BELSOMRA 20 MG TABS Take 1 tablet by mouth  at bedtime.  . Dulaglutide (TRULICITY) 0.73 XT/0.6YI SOPN Inject 0.75 mg into the skin once a week.  . Insulin Pen Needle (BD PEN NEEDLE NANO U/F) 32G X 4 MM MISC 1 application by Does not apply route daily.  Marland Kitchen lamoTRIgine (LAMICTAL) 200 MG tablet Take 300 mg by mouth daily.  . metoCLOPramide (REGLAN) 10 MG tablet Take 1 tablet (10 mg total) by mouth every 6 (six) hours as needed for nausea or vomiting.  . mirtazapine (REMERON) 30 MG tablet Take 30 mg by mouth at bedtime.  Marland Kitchen omeprazole (PRILOSEC) 40 MG capsule Take 1 capsule (40 mg total) by mouth daily.  . paragard intrauterine copper IUD IUD 1 each by Intrauterine route once.  . temazepam (RESTORIL) 15 MG capsule Take 3 capsules by mouth daily.  . [DISCONTINUED] pantoprazole (PROTONIX) 40 MG tablet Take 1 tablet (40 mg total) by mouth 2 (two) times daily.   No facility-administered encounter medications on file as of 11/12/2020.   Allergies  Allergen Reactions  . Geodon [Ziprasidone Hydrochloride] Shortness Of Breath and Swelling  . Hibiclens [Chlorhexidine] Itching    Redness and itching after using CHG wipes  . Sulfamethoxazole-Trimethoprim Other (See Comments)    hallucinations  . Doxycycline Other (See Comments)    Unknown reaction  . Gabapentin Other (See Comments)    Unknown reaction  . Other Other (See Comments)    Bath and body works body wash and lotion caused itching, reddness and rash  . Penicillins  Other (See Comments)    Unknown reaction Did it involve swelling of the face/tongue/throat, SOB, or low BP? Unknown Did it involve sudden or severe rash/hives, skin peeling, or any reaction on the inside of your mouth or nose? Unknown Did you need to seek medical attention at a hospital or doctor's office? Unknown When did it last happen?unknown If all above answers are "NO", may proceed with cephalosporin use.  . Promethazine Other (See Comments)  . Depakote [Divalproex Sodium] Rash   Patient Active Problem List    Diagnosis Date Noted  . Vomiting without nausea 10/17/2020  . Abdominal wall pain in flank 08/27/2020  . Back pain 08/15/2020  . Neck pain 08/15/2020  . Symptomatic mammary hypertrophy 08/15/2020  . Elevated serum creatinine 07/17/2020  . IUD contraception 03/18/2020  . Onychomycosis of left great toe 10/24/2019  . Obesity (BMI 30.0-34.9) 08/24/2019  . Depression with anxiety   . Gastroesophageal reflux disease without esophagitis 01/29/2016  . 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 History of tobacco use 06/09/2009  . History of molar pregnancy, antepartum 12/12/2008  . INSOMNIA 12/11/2008  . 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 Hyperlipidemia 01/03/2007  . Urinary incontinence, mixed 11/25/2006  . BIPOLAR DISORDER 08/04/2006   Social History   Socioeconomic History  . Marital status: Divorced    Spouse name: Not on file  . Number of children: 1  . Years of education: Not on file  . Highest education level: Not on file  Occupational History  . Occupation: disablitiy  Tobacco Use  . Smoking status: Former Smoker    Packs/day: 0.25    Years: 19.00    Pack years: 4.75    Types: Cigarettes    Quit date: 09/23/2019    Years since quitting: 1.1  . Smokeless tobacco: Never Used  . Tobacco comment:  1 pp3d  Vaping Use  . Vaping Use: Never used  Substance and Sexual Activity  . Alcohol use: No  . Drug use: No  . Sexual activity: Yes    Birth control/protection: None  Other Topics Concern  . Not on file  Social History Narrative  . Not on file   Social Determinants of Health   Financial Resource Strain: Not on file  Food Insecurity: No Food Insecurity  . Worried About Charity fundraiser in the Last Year: Never true  . Ran Out of Food in the Last Year: Never true  Transportation Needs: No Transportation Needs  . Lack of Transportation (Medical): No  . Lack of Transportation (Non-Medical): No  Physical Activity: Not on file  Stress: Not on file  Social Connections: Not  on file  Intimate Partner Violence: Not on file    Kimberly Browning's family history is not on file. She was adopted.      Objective:    Vitals:   11/12/20 0902  BP: 100/70  Pulse: 80    Physical Exam Well-developed well-nourishedWF in no acute distress.  Height, [id number], BMI27.9  HEENT; nontraumatic normocephalic, EOMI, PE R LA, sclera anicteric. Oropharynx; not done today Neck; supple, no JVD Cardiovascular; regular rate and rhythm with S1-S2, no murmur rub or gallop Pulmonary; Clear bilaterally Abdomen; soft, nontender, nondistended, no palpable mass or hepatosplenomegaly, bowel sounds are active Rectal; not done today Skin; benign exam, very fine slightly papular tiny erythematous lesions across the anterior thighs Extremities; no clubbing cyanosis or edema skin warm and dry Neuro/Psych; alert and oriented x4, grossly nonfocal mood and affect appropriate       Assessment &  Plan:   #48 45 year old white female with history of chronic GERD, and complaints of dysphagia over the past 4 to 5 months primarily to solids and also to pills. Very recent EGD on 10/06/2020 showed a normal-appearing esophagus, no evidence of stricture or ring or other lesion and biopsies were negative for eosinophilic esophagitis.  She did have reflux changes evident. Patient is having persistent complaints of dysphagia, etiology not clear will need to rule out component of dysmotility, rule out functional  #2 weight loss-resolved, weight has been stable, patient had previously been on Trulicity for weight loss, off over the past month and weight has been stable  #3 bipolar disorder/depression #4 very faint rash on the thighs question medication induced  Plan; Because of complaint of rash we will stop Protonix and start omeprazole 40 mg p.o. every morning AC breakfast. Patient has done well with once daily dosing in the past. We will schedule for barium swallow with a tablet. Further recommendations  pending results of barium swallow, depending on findings will need to consider manometry if symptoms persist  Tonishia Steffy S Obie Silos PA-C 11/12/2020   Cc: Danna Hefty, DO

## 2020-11-12 NOTE — Patient Instructions (Signed)
If you are age 44 or older, your body mass index should be between 23-30. Your Body mass index is 27.99 kg/m. If this is out of the aforementioned range listed, please consider follow up with your Primary Care Provider.  If you are age 14 or younger, your body mass index should be between 19-25. Your Body mass index is 27.99 kg/m. If this is out of the aformentioned range listed, please consider follow up with your Primary Care Provider.   __________________________________________________________  The [facility] providers would like to encourage you to use [facility] to communicate with providers for non-urgent requests or questions.  Due to long hold times on the telephone, sending your provider a message by [facility] may be a faster and more efficient way to get a response.  Please allow 48 business hours for a response.  Please remember that this is for non-urgent requests.   You have been scheduled for a Barium Esophogram at [facility] (1st floor of the hospital) on 11/25/2020 at 10:30 am. Please arrive 15 minutes prior to your appointment for registration. Make certain not to have anything to eat or drink 3 hours prior to your test. If you need to reschedule for any reason, please contact radiology at [phone] to do so. __________________________________________________________________ A barium swallow is an examination that concentrates on views of the esophagus. This tends to be a double contrast exam (barium and two liquids which, when combined, create a gas to distend the wall of the oesophagus) or single contrast (non-ionic iodine based). The study is usually tailored to your symptoms so a good history is essential. Attention is paid during the study to the form, structure and configuration of the esophagus, looking for functional disorders (such as aspiration, dysphagia, achalasia, motility and reflux) EXAMINATION You may be asked to change into a gown, depending on the type of  swallow being performed. A radiologist and radiographer will perform the procedure. The radiologist will advise you of the type of contrast selected for your procedure and direct you during the exam. You will be asked to stand, sit or lie in several different positions and to hold a small amount of fluid in your mouth before being asked to swallow while the imaging is performed .In some instances you may be asked to swallow barium coated marshmallows to assess the motility of a solid food bolus. The exam can be recorded as a digital or video fluoroscopy procedure. POST PROCEDURE It will take 1-2 days for the barium to pass through your system. To facilitate this, it is important, unless otherwise directed, to increase your fluids for the next 24-48hrs and to resume your normal diet.  This test typically takes about 30 minutes to perform. __________________________________________________________________________________  Bridgette Habermann Pantoprazole START Omeprazole 40 mg 1 capsule every morning before breakfast.  Cut food in small bites, chew carefully, and sip liquids between bites  Thank you for entrusting me with your care and choosing [facility].  Amy Esterwood, PA-C

## 2020-11-12 NOTE — Progress Notes (Signed)
I agree with the above note, plan 

## 2020-11-17 NOTE — Progress Notes (Signed)
  Subjective    Kimberly Browning is a 45 y.o. female who complains of itching, copious clear discahrge, and slight blood x 2 days. LMP: unsure. Doesn't have normal periods since getting Copper IUD. Last sexual activity was last Saturday which was nonpainful. Last Pap-smear was in 2020. Declines STD screening. Denies abnormal vaginal bleeding, significant pelvic pain or fever. No UTI symptoms. Denies history of known exposure to STD    Patient interested in restarting Trulicty for weight loss. Desires to lose another 20lbs. She doesn't like the way she looks and will be "happier if I lose more weight". Currently on hold due to recent vomiting and swallowing concerns. Has manometry scheduled with GI soon.  Review of Systems  See HPI above for review of systems.    Objective:    BP 119/72   Pulse 77   Wt 157 lb 12.8 oz (71.6 kg)   SpO2 99%   BMI 27.95 kg/m   Gen:  She appears well, afebrile.  Resp: breathing comfortably on room air, speaking in full sentences GYN:  External genitalia within normal limits.  Vaginal mucosa pink, moist, normal rugae.  Nonfriable cervix without lesions, clear and white chunky discharge, scant bleeding noted on speculum exam. No cervical motion tenderness. IUD strings appreciated on exam. Exam Chaperoned by: Delray Alt   Assessment & Plan:   Obesity (BMI 30.0-34.9) Has lost about 23lbs since starting GLP-1 therapy. Has attempted to increase physical activity. Still desires to lose another 15-20lbs. Some concern for mental health/body dysmorphic syndrome due to excessive desire to lose weight despite her success already. She does have elevated BMI thus some continued weight loss would be healthy for patient. Did encourage her to follow up with psychiatrist to discuss this. Recommended follow up after GI appointment for manometry study prior to restarting any medication. She plans to follow up after procedure. Recommended continued healthy eating and exercise.    Vaginal discharge Wet prep negative. GC/Cl pending. Patient had scant blood on exam - possible period with normal increase in vaginal discharge associated with this. - follow up remaining labs - F/U if symptoms not improving or getting worse.  - Self care instructions given - Return precautions including abdominal pain, fever, chills, nausea, or vomiting given.   Orders Placed This Encounter  Procedures   POCT Wet Prep ([place])   Mina Marble, [place], PGY3 11/20/2020 1:09 PM

## 2020-11-20 ENCOUNTER — Ambulatory Visit (INDEPENDENT_AMBULATORY_CARE_PROVIDER_SITE_OTHER): Payer: Medicare Other | Admitting: Family Medicine

## 2020-11-20 ENCOUNTER — Other Ambulatory Visit: Payer: Self-pay

## 2020-11-20 ENCOUNTER — Encounter: Payer: Self-pay | Admitting: Family Medicine

## 2020-11-20 VITALS — BP 119/72 | HR 77 | Wt 157.8 lb

## 2020-11-20 DIAGNOSIS — N898 Other specified noninflammatory disorders of vagina: Secondary | ICD-10-CM | POA: Insufficient documentation

## 2020-11-20 DIAGNOSIS — E669 Obesity, unspecified: Secondary | ICD-10-CM | POA: Diagnosis not present

## 2020-11-20 LAB — POCT WET PREP (WET MOUNT)
Clue Cells Wet Prep Whiff POC: NEGATIVE
Trichomonas Wet Prep HPF POC: ABSENT

## 2020-11-20 NOTE — Assessment & Plan Note (Signed)
Wet prep negative. GC/Cl pending. Patient had scant blood on exam - possible period with normal increase in vaginal discharge associated with this. - follow up remaining labs - F/U if symptoms not improving or getting worse.  - Self care instructions given - Return precautions including abdominal pain, fever, chills, nausea, or vomiting given.

## 2020-11-20 NOTE — Patient Instructions (Signed)
I will call you with your results and if anything needs to be treated.

## 2020-11-20 NOTE — Assessment & Plan Note (Signed)
Has lost about 23lbs since starting GLP-1 therapy. Has attempted to increase physical activity. Still desires to lose another 15-20lbs. Some concern for mental health/body dysmorphic syndrome due to excessive desire to lose weight despite her success already. She does have elevated BMI thus some continued weight loss would be healthy for patient. Did encourage her to follow up with psychiatrist to discuss this. Recommended follow up after GI appointment for manometry study prior to restarting any medication. She plans to follow up after procedure. Recommended continued healthy eating and exercise.

## 2020-11-21 NOTE — Addendum Note (Signed)
Addended by: Danna Hefty on: 11/21/2020 09:54 AM   Modules accepted: Orders

## 2020-11-25 ENCOUNTER — Ambulatory Visit (HOSPITAL_COMMUNITY)
Admission: RE | Admit: 2020-11-25 | Discharge: 2020-11-25 | Disposition: A | Discharge status: Home / Self Care | Payer: Medicare Other | Source: Ambulatory Visit | Attending: Physician Assistant | Admitting: Physician Assistant

## 2020-11-25 ENCOUNTER — Encounter: Payer: Self-pay | Admitting: Family Medicine

## 2020-11-25 ENCOUNTER — Other Ambulatory Visit: Payer: Self-pay | Admitting: Family Medicine

## 2020-11-25 ENCOUNTER — Other Ambulatory Visit: Payer: Self-pay

## 2020-11-25 DIAGNOSIS — K219 Gastro-esophageal reflux disease without esophagitis: Secondary | ICD-10-CM

## 2020-11-25 DIAGNOSIS — R1310 Dysphagia, unspecified: Secondary | ICD-10-CM | POA: Diagnosis present

## 2020-11-25 DIAGNOSIS — B373 Candidiasis of vulva and vagina: Secondary | ICD-10-CM

## 2020-11-25 DIAGNOSIS — B3731 Acute candidiasis of vulva and vagina: Secondary | ICD-10-CM

## 2020-11-25 LAB — CERVICOVAGINAL ANCILLARY ONLY
Bacterial Vaginitis (gardnerella): NEGATIVE
Candida Glabrata: NEGATIVE
Candida Vaginitis: POSITIVE — AB
Chlamydia: NEGATIVE
Comment: NEGATIVE
Comment: NEGATIVE
Comment: NEGATIVE
Comment: NEGATIVE
Comment: NEGATIVE
Comment: NORMAL
Neisseria Gonorrhea: NEGATIVE
Trichomonas: NEGATIVE

## 2020-11-25 MED ORDER — FLUCONAZOLE 150 MG PO TABS: ORAL_TABLET | ORAL | 0 refills | Status: DC

## 2020-11-30 NOTE — Progress Notes (Signed)
    SUBJECTIVE:   CHIEF COMPLAINT / HPI:   Follow Up Weight Loss Patient returns for weight loss follow up. She has previously been on Phentermine, Metformin, Victoza and Ozempic for this. Given a combination of adverse effects and insurance coverage, she was ultimately transitioned to Trulicity which is covered by her insurance. She has tolerated the 0.75 mg dose in the past but has experienced some nausea with increased dose. Her starting weight on 1/13 was 180 lbs, today she is 159 lb with current BMI 28.17. She reports she was previously walking but has not been since her ankles were injured from not wearing socks with her shoes. She plans to talk again once this heals. Diet consists of simple foods as she does not know how to cook. She requests some medication to help with the Trulicity as she feels that she has something "stuck, like a hairball" in her throat when she takes it. Denies abdominal pain, NVD.   Swallowing Difficulties Followed by GI. Had recent barium swallow performed 6/21 which was showed normal esophagus structure and motility without reflux.  States they prescribed a medication for acid reflux but her insurance won't cover it.   PERTINENT  PMH / PSH:  Past Medical History:  Diagnosis Date   Anxiety    Bipolar disorder ([place])    Depression    Dry skin    hands   Feeling of incomplete bladder emptying    GERD (gastroesophageal reflux disease)    Hypercholesteremia    Nocturia    Sleep apnea    Urge urinary incontinence    Vitamin D deficiency    Wears contact lenses     OBJECTIVE:   BP 120/70   Pulse 74   Ht 5\' 3"  (1.6 m)   Wt 159 lb (72.1 kg)   SpO2 97%   BMI 28.17 kg/m    General: NAD, pleasant, able to participate in exam Respiratory: Breathing comfortably on room air, no respiratory distress Psych: Pressured speech, somewhat anxious appearing  ASSESSMENT/PLAN:   Overweight (BMI 25.0-29.9) Continues to desire more weight loss. We discussed goal of  long-term weight loss and that improved diet and exercise are very important to help achieve this and to keep off the weight long-term. Also discussed how significantly reducing intake would actually be detrimental to weight loss as body is more prone to store foods as fat. Encouraged her to learn to cook by watching videos as this could help with her diet. Gave exercise recommendations. Can trial Trulicity again- will do 0.75 mg every other week to hopefully help decrease side effects. If she begins to have nausea/vomiting again, will stop.  -F/u in 1 month for weight check and f/u Trulicity   Globus sensation Has seen GI. Had normal EGD 10/2020 and normal barium swallow 11/2020. Patient appears that this sensation is related to Trulicity- I am not familiar with this side effect. Rx Omeprazole by GI but patient reports this is no longer covered by her insurance and she cannot afford it.  -Famotidine Rx      [name], [place]

## 2020-11-30 NOTE — Patient Instructions (Addendum)
It was wonderful to meet you today.  Please bring ALL of your medications with you to every visit.   Today we talked about:  We talked about increasing your exercise and working on improving diet for continued weight loss. Below are some recommendations for diet.  I have prescribed Famotidine for reflux for you to take if you can't afford the Omeprazole due to insurance reasons.  You can take 6.43 mg of Trulicity every other week. Come back in 1 month.   Thank you for choosing [facility].   Please call [phone] with any questions about today's appointment.  Please be sure to schedule follow up at the front  desk before you leave today.   [name], DO PGY-1 Family Medicine    Healthy Eating Following a healthy eating pattern may help you to achieve and maintain a healthy body weight, reduce the risk of chronic disease, and live a long and productive life. It is important to follow a healthy eating pattern at an appropriate calorie level for your body. Your nutritional needs should be metprimarily through food by choosing a variety of nutrient-rich foods. What are tips for following this plan? Reading food labels Read labels and choose the following: Reduced or low sodium. Juices with 100% fruit juice. Foods with low saturated fats and high polyunsaturated and monounsaturated fats. Foods with whole grains, such as whole wheat, cracked wheat, brown rice, and wild rice. Whole grains that are fortified with folic acid. This is recommended for women who are pregnant or who want to become pregnant. Read labels and avoid the following: Foods with a lot of added sugars. These include foods that contain brown sugar, corn sweetener, corn syrup, dextrose, fructose, glucose, high-fructose corn syrup, honey, invert sugar, lactose, malt syrup, maltose, molasses, raw sugar, sucrose, trehalose, or turbinado sugar. Do not eat more than the following amounts of added  sugar per day: 6 teaspoons (25 g) for women. 9 teaspoons (38 g) for men. Foods that contain processed or refined starches and grains. Refined grain products, such as white flour, degermed cornmeal, white bread, and white rice. Shopping Choose nutrient-rich snacks, such as vegetables, whole fruits, and nuts. Avoid high-calorie and high-sugar snacks, such as potato chips, fruit snacks, and candy. Use oil-based dressings and spreads on foods instead of solid fats such as butter, stick margarine, or cream cheese. Limit pre-made sauces, mixes, and "instant" products such as flavored rice, instant noodles, and ready-made pasta. Try more plant-protein sources, such as tofu, tempeh, black beans, edamame, lentils, nuts, and seeds. Explore eating plans such as the Mediterranean diet or vegetarian diet. Cooking Use oil to saut or stir-fry foods instead of solid fats such as butter, stick margarine, or lard. Try baking, boiling, grilling, or broiling instead of frying. Remove the fatty part of meats before cooking. Steam vegetables in water or broth. Meal planning  At meals, imagine dividing your plate into fourths: One-half of your plate is fruits and vegetables. One-fourth of your plate is whole grains. One-fourth of your plate is protein, especially lean meats, poultry, eggs, tofu, beans, or nuts. Include low-fat dairy as part of your daily diet.  Lifestyle Choose healthy options in all settings, including home, work, school, restaurants, or stores. Prepare your food safely: Wash your hands after handling raw meats. Keep food preparation surfaces clean by regularly washing with hot, soapy water. Keep raw meats separate from ready-to-eat foods, such as fruits and vegetables. Cook seafood, meat, poultry, and eggs to the recommended internal  temperature. Store foods at safe temperatures. In general: Keep cold foods at 8F (4.4C) or below. Keep hot foods at 18F (60C) or above. Keep your  freezer at [facility] (-17.8C) or below. Foods are no longer safe to eat when they have been between the temperatures of 40-18F (4.4-60C) for more than 2 hours. What foods should I eat? Fruits Aim to eat 2 cup-equivalents of fresh, canned (in natural juice), or frozen fruits each day. Examples of 1 cup-equivalent of fruit include 1 small apple, 8large strawberries, 1 cup canned fruit,  cup dried fruit, or 1 cup 100% juice. Vegetables Aim to eat 2-3 cup-equivalents of fresh and frozen vegetables each day, including different varieties and colors. Examples of 1 cup-equivalent of vegetables include 2 medium carrots, 2 cups raw, leafy greens, 1 cup choppedvegetable (raw or cooked), or 1 medium baked potato. Grains Aim to eat 6 ounce-equivalents of whole grains each day. Examples of 1 ounce-equivalent of grains include 1 slice of bread, 1 cup ready-to-eat cereal,3 cups popcorn, or  cup cooked rice, pasta, or cereal. Meats and other proteins Aim to eat 5-6 ounce-equivalents of protein each day. Examples of 1 ounce-equivalent of protein include 1 egg, 1/2 cup nuts or seeds, or 1 tablespoon (16 g) peanut butter. A cut of meat or fish that is the size of a deck of cards is about 3-4 ounce-equivalents. Of the protein you eat each week, try to have at least 8 ounces come from seafood. This includes salmon, trout, herring, and anchovies. Dairy Aim to eat 3 cup-equivalents of fat-free or low-fat dairy each day. Examples of 1 cup-equivalent of dairy include 1 cup (240 mL) milk, 8 ounces (250 g) yogurt,1 ounces (44 g) natural cheese, or 1 cup (240 mL) fortified soy milk. Fats and oils Aim for about 5 teaspoons (21 g) per day. Choose monounsaturated fats, such as canola and olive oils, avocados, peanut butter, and most nuts, or polyunsaturated fats, such as sunflower, corn, and soybean oils, walnuts, pine nuts, sesame seeds, sunflower seeds, and flaxseed. Beverages Aim for six 8-oz glasses of water per day. Limit  coffee to three to five 8-oz cups per day. Limit caffeinated beverages that have added calories, such as soda and energy drinks. Limit alcohol intake to no more than 1 drink a day for nonpregnant women and 2 drinks a day for men. One drink equals 12 oz of beer (355 mL), 5 oz of wine (148 mL), or 1 oz of hard liquor (44 mL). Seasoning and other foods Avoid adding excess amounts of salt to your foods. Try flavoring foods with herbs and spices instead of salt. Avoid adding sugar to foods. Try using oil-based dressings, sauces, and spreads instead of solid fats. This information is based on general U.S[place] nutrition guidelines. For more information, visit [employer]es. Exact amounts may vary based on your nutrition needs. Summary A healthy eating plan may help you to maintain a healthy weight, reduce the risk of chronic diseases, and stay active throughout your life. Plan your meals. Make sure you eat the right portions of a variety of nutrient-rich foods. Try baking, boiling, grilling, or broiling instead of frying. Choose healthy options in all settings, including home, work, school, restaurants, or stores. This information is not intended to replace advice given to you by your health care provider. Make sure you discuss any questions you have with your healthcare provider. Document Revised: 09/05/2017 Document Reviewed: 09/05/2017 Elsevier Patient Education  [place].

## 2020-12-01 ENCOUNTER — Ambulatory Visit (INDEPENDENT_AMBULATORY_CARE_PROVIDER_SITE_OTHER): Payer: Medicare Other | Admitting: Family Medicine

## 2020-12-01 ENCOUNTER — Other Ambulatory Visit: Payer: Self-pay

## 2020-12-01 ENCOUNTER — Encounter: Payer: Self-pay | Admitting: Family Medicine

## 2020-12-01 ENCOUNTER — Telehealth: Payer: Self-pay | Admitting: *Deleted

## 2020-12-01 DIAGNOSIS — R198 Other specified symptoms and signs involving the digestive system and abdomen: Secondary | ICD-10-CM | POA: Diagnosis not present

## 2020-12-01 DIAGNOSIS — E663 Overweight: Secondary | ICD-10-CM | POA: Diagnosis not present

## 2020-12-01 DIAGNOSIS — R0989 Other specified symptoms and signs involving the circulatory and respiratory systems: Secondary | ICD-10-CM | POA: Insufficient documentation

## 2020-12-01 DIAGNOSIS — R09A2 Foreign body sensation, throat: Secondary | ICD-10-CM | POA: Insufficient documentation

## 2020-12-01 MED ORDER — FAMOTIDINE 20 MG PO TABS: 20.0000 mg | ORAL_TABLET | Freq: Two times a day (BID) | ORAL | 1 refills | Status: DC

## 2020-12-01 NOTE — Telephone Encounter (Signed)
Per [facility] policy, providers call pt with positive results. Neelie Welshans Kennon Holter, CMA

## 2020-12-01 NOTE — Assessment & Plan Note (Signed)
Continues to desire more weight loss. We discussed goal of long-term weight loss and that improved diet and exercise are very important to help achieve this and to keep off the weight long-term. Also discussed how significantly reducing intake would actually be detrimental to weight loss as body is more prone to store foods as fat. Encouraged her to learn to cook by watching videos as this could help with her diet. Gave exercise recommendations. Can trial Trulicity again- will do 0.75 mg every other week to hopefully help decrease side effects. If she begins to have nausea/vomiting again, will stop.  -F/u in 1 month for weight check and f/u Trulicity

## 2020-12-01 NOTE — Assessment & Plan Note (Addendum)
Has seen GI. Had normal EGD 10/2020 and normal barium swallow 11/2020. Patient appears that this sensation is related to Trulicity- I am not familiar with this side effect. Rx Omeprazole by GI but patient reports this is no longer covered by her insurance and she cannot afford it.  -Famotidine Rx

## 2020-12-01 NOTE — Telephone Encounter (Signed)
-----   Message from Danna Hefty, [place] sent at 11/25/2020 10:11 AM EDT ----- I wont be able to reach patient until this PM. Please let her know that she was positive for yeast on the send out labs. I will call in a prescription to treat this. Thank you.

## 2020-12-10 ENCOUNTER — Other Ambulatory Visit: Payer: Self-pay | Admitting: Family Medicine

## 2020-12-15 ENCOUNTER — Telehealth: Payer: Self-pay | Admitting: Physician Assistant

## 2020-12-15 MED ORDER — ESOMEPRAZOLE MAGNESIUM 40 MG PO CPDR: 40.0000 mg | DELAYED_RELEASE_CAPSULE | Freq: Two times a day (BID) | ORAL | 4 refills | Status: DC

## 2020-12-15 NOTE — Telephone Encounter (Signed)
Nexium has been sent to patient's pharmacy.

## 2020-12-15 NOTE — Telephone Encounter (Signed)
Inbound call from pt requesting a call back stating that the medication omeprazole that was prescribed to her isn't working. Please advise. Thanks.

## 2020-12-15 NOTE — Telephone Encounter (Signed)
Spoke with patient, she states that she has been taking the Omeprazole 40 mg twice a day and as of this past weekend it is no longer working for her. She states that she has been taking the Famotidine as well, but the reflux returns. Please advise.

## 2020-12-16 MED ORDER — DEXLANSOPRAZOLE 60 MG PO CPDR: 60.0000 mg | DELAYED_RELEASE_CAPSULE | Freq: Every day | ORAL | 4 refills | Status: DC

## 2020-12-16 NOTE — Telephone Encounter (Signed)
Sent in [place], will wait to see if it needs a prior [place]

## 2020-12-16 NOTE — Telephone Encounter (Signed)
Pt calling states she has tried Nexium in the past; pt states this med does not help her sxs. Pt is requesting alternative med.. Plz advise

## 2020-12-18 NOTE — Telephone Encounter (Signed)
Called patient's pharmacy to verify that patient's insurance was going to cover her [place]. They have verified that is is covered. Patient has been contacted that we had sent in a new medication and her insurance was going to cover it.

## 2020-12-31 NOTE — Progress Notes (Signed)
    SUBJECTIVE:   CHIEF COMPLAINT / HPI:   Follow up Weight Loss She was last seen on 6/27 and advised to continue Trulicity [id number] mg every other week. She has been taking it weekly and has no adverse effects. Weight at last appointment was 159 lbs, she is 154 lbs today.   Globus sensation Patient was prescribed Famotidine at last appointment. She has recently been switched to Dexilant by GI. She states this medication "stopped working". She is requesting medication change.   Vaginal Discharge Clear and white discharge. Started 1 week ago. No bad smell. States this typically happens after intercourse. Had yeast infection in June and given medication. This is bothersome to her and she would like to be checked for BV and yeast. Declines STI screening. States she only has 1 sexual partner.   PERTINENT  PMH / PSH:  Past Medical History:  Diagnosis Date   Anxiety    Bipolar disorder ([place])    Depression    Dry skin    hands   Feeling of incomplete bladder emptying    GERD (gastroesophageal reflux disease)    Hypercholesteremia    Nocturia    Sleep apnea    Urge urinary incontinence    Vitamin D deficiency    Wears contact lenses      OBJECTIVE:   BP 112/86   Pulse 86   Ht '5\' 3"'$  (1.6 m)   Wt 154 lb 3.2 oz (69.9 kg)   SpO2 100%   BMI 27.32 kg/m    General: NAD, pleasant, pressured speech and tangential  Respiratory: Breathing comfortably on room air, in no respiratory distress GU: Normal appearance of labia majora and minora, without lesions. Vagina tissue pink, moist, without lesions or abrasions. Cervix normal appearance, non-friable. There is notable white and runny discharge from os.  Psych: Pressured speech, tangential  GU exam chaperoned by April Rumple, CMA   ASSESSMENT/PLAN:   Overweight (BMI 25.0-29.9) Continue Trulicity A999333 mg weekly. Appears to be tolerating well and having success with weight loss.   Gastroesophageal reflux disease without  esophagitis Recently prescribed Dexilant by GI. Had temporary relief with this. Also taking Pepcid. Recommended patient contact GI for further recommendations as they have been managing her reflux medications. She also did not bring her medications with her today and I am unsure of what all she is taking. She was not able to tell me definitively.   Vaginal discharge Wet prep negative. Declined STI screening. GU exam with notable white and runny discharge. Could be normal vs STI. Would encourage STI screening at follow up.   BIPOLAR DISORDER Appears she was previously followed by Dr. Noemi Chapel. Unable to see any recent records. Unclear what medications patient is on. She seemed to not know she had a diagnosis of bipolar. She appears restless and with tangential and pressured speech. Encouraged patient to bring all medications at next appointment. Encouraged patient to follow back with Psychiatry as they were managing her bipolar medications.     [name], [place]

## 2021-01-02 ENCOUNTER — Telehealth: Payer: Self-pay | Admitting: Physician Assistant

## 2021-01-02 ENCOUNTER — Other Ambulatory Visit: Payer: Self-pay | Admitting: Family Medicine

## 2021-01-02 ENCOUNTER — Other Ambulatory Visit: Payer: Self-pay

## 2021-01-02 ENCOUNTER — Encounter: Payer: Self-pay | Admitting: Family Medicine

## 2021-01-02 ENCOUNTER — Telehealth: Payer: Self-pay

## 2021-01-02 ENCOUNTER — Ambulatory Visit (INDEPENDENT_AMBULATORY_CARE_PROVIDER_SITE_OTHER): Payer: Medicare Other | Admitting: Family Medicine

## 2021-01-02 VITALS — BP 112/86 | HR 86 | Ht 63.0 in | Wt 154.2 lb

## 2021-01-02 DIAGNOSIS — N898 Other specified noninflammatory disorders of vagina: Secondary | ICD-10-CM

## 2021-01-02 DIAGNOSIS — E663 Overweight: Secondary | ICD-10-CM | POA: Diagnosis not present

## 2021-01-02 DIAGNOSIS — R1310 Dysphagia, unspecified: Secondary | ICD-10-CM

## 2021-01-02 DIAGNOSIS — K219 Gastro-esophageal reflux disease without esophagitis: Secondary | ICD-10-CM

## 2021-01-02 LAB — POCT WET PREP (WET MOUNT)
Clue Cells Wet Prep Whiff POC: NEGATIVE
Trichomonas Wet Prep HPF POC: ABSENT

## 2021-01-02 NOTE — Assessment & Plan Note (Addendum)
Wet prep negative. Declined STI screening. GU exam with notable white and runny discharge. Could be normal vs STI. Would encourage STI screening at follow up.

## 2021-01-02 NOTE — Progress Notes (Signed)
Referral to ENT sent per patient request. Can evaluate for possible LPR?

## 2021-01-02 NOTE — Patient Instructions (Addendum)
It was wonderful to see you today.  Please bring ALL of your medications with you to every visit.   Today we talked about:  -Call your GI doctor about your reflux medications. [phone]. -We did a test to check for Bacterial Vaginosis and Yeast. I will let you know the results and send a prescription to your pharmacy if needed.    Thank you for choosing [facility].   Please call [phone] with any questions about today's appointment.  Please be sure to schedule follow up at the front  desk before you leave today.   [name], DO PGY-2 Family Medicine

## 2021-01-02 NOTE — Assessment & Plan Note (Signed)
Continue Trulicity A999333 mg weekly. Appears to be tolerating well and having success with weight loss.

## 2021-01-02 NOTE — Assessment & Plan Note (Addendum)
Appears she was previously followed by Dr. Noemi Chapel. Unable to see any recent records. Unclear what medications patient is on. She seemed to not know she had a diagnosis of bipolar. She appears restless and with tangential and pressured speech. Encouraged patient to bring all medications at next appointment. Encouraged patient to follow back with Psychiatry as they were managing her bipolar medications.

## 2021-01-02 NOTE — Telephone Encounter (Signed)
Patient calls nurse line requesting a referral for an ENT. Patient states that she would like to see another specialist regarding acid reflux symptoms and the sore throat she continues to experience. Patient states that GI specialist performed scope but was not able to help with problem.   Please advise.   Talbot Grumbling, RN

## 2021-01-02 NOTE — Assessment & Plan Note (Signed)
Recently prescribed Dexilant by GI. Had temporary relief with this. Also taking Pepcid. Recommended patient contact GI for further recommendations as they have been managing her reflux medications. She also did not bring her medications with her today and I am unsure of what all she is taking. She was not able to tell me definitively.

## 2021-01-02 NOTE — Telephone Encounter (Signed)
Patient calls nurse line regarding results from wet prep. Informed patient of negative results. Patient would like to discuss further with provider as she has questions as to why she is having vaginal irritation.   Talbot Grumbling, RN

## 2021-01-02 NOTE — Telephone Encounter (Signed)
Inbound call from patient following up on prescription

## 2021-01-05 NOTE — Telephone Encounter (Signed)
Called and spoke with the patient, she stated that she has been having burning that starts all of a sudden and goes up her throat. She has already tried Nexium and Omeprazole, and is currently on Dexilant. I let her know that I would schedule a follow up with Dr. Ardis Hughs when I called her back.

## 2021-01-05 NOTE — Telephone Encounter (Signed)
What would you like to do since it seem like nothing is working for her?

## 2021-01-06 MED ORDER — FAMOTIDINE 20 MG PO TABS: ORAL_TABLET | ORAL | 1 refills | Status: DC

## 2021-01-06 MED ORDER — AMBULATORY NON FORMULARY MEDICATION: 1 refills | Status: DC

## 2021-01-06 NOTE — Telephone Encounter (Signed)
She is already taking Pepcid 20 mg BID. Are just wanting to increase and have her once daily? Or should she increase and continue twice daily?

## 2021-01-06 NOTE — Telephone Encounter (Signed)
Called and spoke with patient about medication adjustments. I advised that she needs to start taking the Famotidine 1 tablet in the morning and 2 tablets at night. I also advised that I would send in a GI Cocktail to her pharmacy. I did advise that I was not sure if her pharmacy would be able to fill this and she may need to use another pharmacy, but she stated that didn't think she would be able to use a different pharmacy. I have also scheduled her for the next available with Dr. Ardis Hughs.

## 2021-01-13 ENCOUNTER — Telehealth: Payer: Self-pay

## 2021-01-13 NOTE — Telephone Encounter (Signed)
Patient calls nurse line regarding diarrhea. Onset Thursday, 8/4. Reports abdominal pain, however, improves after bowel movement. Denies fever, nausea or vomiting. Patient also reports runny nose and nasal congestion.   Reports decreased appetite. States that she is having good PO intake.   Also reports that menstrual cycle "has been off." Start of period was Thursday, 8/4. Reports that period has been light and will "stop and then come back." IUD placed in October 2021.  Scheduled patient for tomorrow afternoon at 2:20.   Talbot Grumbling, RN

## 2021-01-14 ENCOUNTER — Ambulatory Visit (INDEPENDENT_AMBULATORY_CARE_PROVIDER_SITE_OTHER): Payer: Medicare Other | Admitting: Family Medicine

## 2021-01-14 VITALS — BP 109/77 | HR 80

## 2021-01-14 DIAGNOSIS — R197 Diarrhea, unspecified: Secondary | ICD-10-CM | POA: Diagnosis present

## 2021-01-14 NOTE — Progress Notes (Signed)
    SUBJECTIVE:   CHIEF COMPLAINT / HPI:   Ms. Martincic is a 45 yo who presents to [facility] for persistent diarhhea since last Thursday. Has not noticed any blood in stool. Denies cramping. Denies fever. Denies coughing, does endorse runny nose of unknown duration. Denies feeling lightheaded or dizzy. Has not tried to take anything for the diarrhea. States she has been drinking a lot of water and has been able to eat without vomiting. Niece has been sick with a fever, unsure about diarrhea, last seen her on Sunday. Is vaccinated against COVID. Denies any other concerns today.    OBJECTIVE:   BP 109/77   Pulse 80   SpO2 100%    General: alert, well appearing, NAD CV: RRR no murmurs Resp: CTAB normal WOB GI: soft, non distended, non tender   ASSESSMENT/PLAN:   No problem-specific Assessment & Plan notes found for this encounter.   Diarrhea  1 week of non bloody diarrhea without abdominal pain, fever, or other symptoms. Has been staying hydrated and without nausea or vomiting. Vitals stable and PE unremarkable. Diarrhea potentially due to a viral infection .Could potentially be due to medication though she does not endorse new medication changes. Unlikely to be IBD given no abdominal pain. Recommended patient use pepto bismal and continue to stay hydrated. Gave strict return precautions and recommended follow up if symptoms do not improve   [place]

## 2021-01-14 NOTE — Patient Instructions (Addendum)
It was great seeing you today!  You came in for diarrhea. You can use over the counter pepto bismal for the next several days until the diarrhea resolves. It is important to drink plenty of water to stay hydrated. Return if you see blood in your stool, you feel like you are going to pass out, or have new and concerning symptoms.   Feel free to call with any questions or concerns at any time, at [phone].   Take care,  Dr. Shary Key [facility]

## 2021-01-18 NOTE — Patient Instructions (Addendum)
It was wonderful to see you today.  Please bring ALL of your medications with you to every visit.   Today we talked about:  -If increased her Trulicity to 1.5 mg.  Take this weekly starting tomorrow. -I would recommend scheduling an appointment for IUD removal if you continue to have irregular periods.   -I will try and obtain records from your psychiatrist to review.   Thank you for choosing [facility].   Please call [phone] with any questions about today's appointment.  Please be sure to schedule follow up at the front  desk before you leave today.   [name], DO PGY-2 Family Medicine

## 2021-01-18 NOTE — Progress Notes (Signed)
SUBJECTIVE:   CHIEF COMPLAINT / HPI:   Medication Concern Kimberly Browning is a 45 y.o. female who presents today for medication concerns.  She would like to increase her Trulicity. She states she was seen for diarrhea at most recent visit though she does not believe this was due to her Trulicity as she has been on it for several months. She has about 2 episodes of diarrhea a day, isn't taking anything else for it. She still has two more 0.75 mg pens that she would like to take prior to increase.  Irregular Periods Patient notes that she has irregular periods.  She currently has ParaGard IUD in.  She does not want this taken out at this time.  Bipolar Disorder  Anxiety Has a history of bipolar and anxiety/depression.  States that she sees a psychiatrist who manages her medications but does not have any follow-up appointments until next year.  She typically just calls them for medication refills.  Reports no change in medications recently.  States that because they moved locations she has difficulty getting there due to gas.  She declines referral to chronic care management for help with transportation.  PERTINENT  PMH / PSH:  Past Medical History:  Diagnosis Date   Anxiety    Bipolar disorder ([place])    Depression    Dry skin    hands   Feeling of incomplete bladder emptying    GERD (gastroesophageal reflux disease)    Hypercholesteremia    Nocturia    Sleep apnea    Urge urinary incontinence    Vitamin D deficiency    Wears contact lenses      OBJECTIVE:   BP 100/60   Pulse 73   Ht '5\' 3"'$  (1.6 m)   Wt 152 lb 8 oz (69.2 kg)   SpO2 99%   BMI 27.01 kg/m    General: NAD, pleasant, able to participate in exam Cardiac: RRR, no murmurs. Respiratory: CTAB, normal effort, No wheezes, rales or rhonchi Abdomen: Bowel sounds present, nontender, non-distended Extremities: no edema or cyanosis. Skin: warm and dry, no rashes noted Psych: Pressured speech, tangential  Depression  screen [facility] 2/9 01/20/2021 01/02/2021 12/01/2020  Decreased Interest '1 2 2  '$ Down, Depressed, Hopeless '1 2 3  '$ PHQ - 2 Score '2 4 5  '$ Altered sleeping 0 0 0  Tired, decreased energy 0 2 3  Change in appetite 0 2 0  Feeling bad or failure about yourself  '3 3 3  '$ Trouble concentrating 0 0 0  Moving slowly or fidgety/restless 0 0 0  Suicidal thoughts 0 0 0  PHQ-9 Score '5 11 11  '$ Some recent data might be hidden     ASSESSMENT/PLAN:   Overweight (BMI 25.0-29.9) On Trulicity for weight loss. Pt with 28 pound weight loss in the last 5 months.  She has lost 2 pounds since her last visit with me July 29th.  Although she is having problems with diarrhea, she insists that we increase her Trulicity.  I discussed with her the common side effects including diarrhea, constipation, abdominal pain, nausea, and vomiting.  She is still amenable to increase.  We will trial her on 1.5 mg weekly of Trulicity, will decrease back to 0.75 mg if she is unable to tolerate.  IUD contraception Suspect that her irregular periods are likely due to her ParaGard.  We discussed possible removal of this.  She would like to wait at this time.  She can call her office to  schedule appointment for removal if she decides to move forward with this.  BIPOLAR DISORDER Continues to have very pressured speech and is often tangential.  She has documented history of bipolar, anxiety, and depression.  I am not able to access records from her psychiatrist.  Consent for release of information signed today. Discussed possible re-referral to Psychiatrist but patient is adamant that she still sees her Psychiatrist, although has no appointments for the remainder of the year. I do believe she would benefit from closer follow up. Will review records once they arrive.     [name], [place]

## 2021-01-20 ENCOUNTER — Telehealth: Payer: Self-pay | Admitting: Physician Assistant

## 2021-01-20 ENCOUNTER — Other Ambulatory Visit: Payer: Self-pay

## 2021-01-20 ENCOUNTER — Encounter: Payer: Self-pay | Admitting: Family Medicine

## 2021-01-20 ENCOUNTER — Ambulatory Visit (INDEPENDENT_AMBULATORY_CARE_PROVIDER_SITE_OTHER): Payer: Medicare Other | Admitting: Family Medicine

## 2021-01-20 DIAGNOSIS — E663 Overweight: Secondary | ICD-10-CM

## 2021-01-20 DIAGNOSIS — Z975 Presence of (intrauterine) contraceptive device: Secondary | ICD-10-CM

## 2021-01-20 MED ORDER — TRULICITY 1.5 MG/0.5ML ~~LOC~~ SOAJ: 1.5000 mg | SUBCUTANEOUS | 1 refills | Status: DC

## 2021-01-20 NOTE — Assessment & Plan Note (Signed)
On Trulicity for weight loss. Pt with 28 pound weight loss in the last 5 months.  She has lost 2 pounds since her last visit with me July 29th.  Although she is having problems with diarrhea, she insists that we increase her Trulicity.  I discussed with her the common side effects including diarrhea, constipation, abdominal pain, nausea, and vomiting.  She is still amenable to increase.  We will trial her on 1.5 mg weekly of Trulicity, will decrease back to 0.75 mg if she is unable to tolerate.

## 2021-01-20 NOTE — Assessment & Plan Note (Signed)
Suspect that her irregular periods are likely due to her ParaGard.  We discussed possible removal of this.  She would like to wait at this time.  She can call her office to schedule appointment for removal if she decides to move forward with this.

## 2021-01-20 NOTE — Telephone Encounter (Signed)
Patient calling to inform the med Dexilant is giving her a lot of gas. Pt wants to know how to treat sxs.. Plz advise thank you

## 2021-01-20 NOTE — Assessment & Plan Note (Signed)
Continues to have very pressured speech and is often tangential.  She has documented history of bipolar, anxiety, and depression.  I am not able to access records from her psychiatrist.  Consent for release of information signed today. Discussed possible re-referral to Psychiatrist but patient is adamant that she still sees her Psychiatrist, although has no appointments for the remainder of the year. I do believe she would benefit from closer follow up. Will review records once they arrive.

## 2021-01-20 NOTE — Telephone Encounter (Signed)
Called and spoke with patient, discussed using Gas-x. She states that she has tried to use Gas-X which has not helped. Reviewed chart and her last visit with her PCP. I noticed that she is back on Trulicity, and reviewed the side effects of medication which include: Common side effects of Trulicity include:  nausea, diarrhea, frequent bowel movements, vomiting, abdominal pain or discomfort, decreased appetite, indigestion, fatigue, constipation, gas, bloating, gastroesophageal reflux disease (GERD), weakness/lack of energy, feeling unwell (malaise), belching, and low blood sugar (hypoglycemia).  Do you have any suggestions on how to relieve her gas?

## 2021-01-23 NOTE — Telephone Encounter (Signed)
Spoke with patient and advised that her Dexilant should not be what is causing her to have gas/bloating. I advised that this is a side effect on Trulicity and that she may need to speak with her PCP about this. I advised she could try to stop taking Dexilant to see if the gas goes away.

## 2021-01-26 ENCOUNTER — Telehealth: Payer: Self-pay | Admitting: Physician Assistant

## 2021-01-26 ENCOUNTER — Other Ambulatory Visit: Payer: Self-pay

## 2021-01-26 DIAGNOSIS — A09 Infectious gastroenteritis and colitis, unspecified: Secondary | ICD-10-CM

## 2021-01-26 MED ORDER — DICYCLOMINE HCL 10 MG PO CAPS: 10.0000 mg | ORAL_CAPSULE | Freq: Three times a day (TID) | ORAL | 1 refills | Status: DC

## 2021-01-26 NOTE — Telephone Encounter (Signed)
Pt called stating that diarrhea has worsenned and she cannot wait until her appt with Jessica on 02/25/21. Pls call her.

## 2021-01-26 NOTE — Telephone Encounter (Signed)
Spoke with the patient and instructed her on the plan. She agrees to this plan of care.

## 2021-01-26 NOTE — Telephone Encounter (Signed)
Order GI stool profile under Dr. Ardis Hughs name  Discontinue Reglan Dicyclomine 10 mg po tid ac, #90, 1 refill  Keep appt with JZ as scheduled

## 2021-01-26 NOTE — Telephone Encounter (Signed)
Doctor of the day   Primary GI is Dr Ardis Hughs. She was last seen by Nicoletta Ba, PA for GERD. Spoke with the patient. She complains of diarrhea 3 to 4 times a day after she eats. She has urgency and abdominal cramps that "make me sweat when I have diarrhea." States Pepto-Bismol and Imodium do not help control her symptoms. She is afebrile. Denies nausea. She is not taking Reglan. PCP is aware. No labs have been done.

## 2021-01-27 ENCOUNTER — Other Ambulatory Visit: Payer: Medicare Other

## 2021-01-27 NOTE — Telephone Encounter (Signed)
Inbound call from patient. States she picked up the stool kit. Now she is experiencing blood when she wipe. She does not know whether it is in her stool because the diarrhea is very dark. Best contact number [phone]

## 2021-01-27 NOTE — Telephone Encounter (Signed)
Spoke with the patient. She will use moistened wipe and clean gently. Vasoline to abraded skin. She has been taking bismuth for her diarrhea. She is advised the bismuth can cause dark stools.

## 2021-01-29 ENCOUNTER — Telehealth: Payer: Self-pay

## 2021-01-29 NOTE — Telephone Encounter (Signed)
Patient called states she is having trouble with returning the stool sample kit seeking advise.

## 2021-01-29 NOTE — Telephone Encounter (Signed)
Patient called on 01/26/21 with complaints of urgent diarrhea and abdominal cramping. She was given Bentyl, instructed to stop Reglan and come for Gi stool  profile. Patient calls today because she has not been able to get a ride to return her stool specimen to the lab. The stool was collected as instructed on 01/26/21 and refrigerated. The specimen is no longer viable per Jessica, lab tech and will need to be re-collected. The patient has been afraid to take Bentyl. She read on the warnings that it may cause insomnia. She has insomnia and is afraid the medication will make that worse. She states her symptoms are unchanged. She has lost her appetite. Forces applesauce and other plain foods. Stools 3 to 4 times a day.  She understands she must return to get a new stool collection kit. She will try the Bentyl early in the day and avoid taking it near bedtime in case it does cause her to feel hyperactive.  Return call 02/02/21 to check her progress.  

## 2021-02-03 NOTE — Telephone Encounter (Signed)
Patient calling back stating she is still having issues and would like to speak with a nurse.  Please advise.

## 2021-02-06 ENCOUNTER — Ambulatory Visit: Payer: Medicare Other | Admitting: Family Medicine

## 2021-02-10 ENCOUNTER — Telehealth: Payer: Self-pay | Admitting: Physician Assistant

## 2021-02-10 NOTE — Telephone Encounter (Signed)
Pt wants to know how long can stool sample remain in the freezer to still be viable. Pls call her.

## 2021-02-11 ENCOUNTER — Other Ambulatory Visit: Payer: Medicare Other

## 2021-02-11 DIAGNOSIS — A09 Infectious gastroenteritis and colitis, unspecified: Secondary | ICD-10-CM

## 2021-02-11 NOTE — Telephone Encounter (Signed)
Called the patient and gave her contact information to the lab. I advised her I do not know for certain the guideline and feel she can get the answers and direction she needs by speaking with the lab staff. [phone]

## 2021-02-14 LAB — GI PROFILE, STOOL, PCR

## 2021-02-17 ENCOUNTER — Telehealth: Payer: Self-pay | Admitting: Gastroenterology

## 2021-02-17 NOTE — Telephone Encounter (Signed)
Called the patient. Advised her of the stool study results and recommendations from Dr Ardis Hughs. She will pick up the Imodium tomorrow. (She does not drive) She will keep her appointment 02/25/21.

## 2021-02-17 NOTE — Telephone Encounter (Signed)
Pt states that she was getting better, did not experience diarrhea for a week until this morning. Pls call her.

## 2021-02-17 NOTE — Telephone Encounter (Signed)
The pt last saw Amy Esterwood.

## 2021-02-20 ENCOUNTER — Other Ambulatory Visit (HOSPITAL_COMMUNITY): Payer: Self-pay

## 2021-02-25 ENCOUNTER — Telehealth: Payer: Self-pay | Admitting: Gastroenterology

## 2021-02-25 ENCOUNTER — Ambulatory Visit (INDEPENDENT_AMBULATORY_CARE_PROVIDER_SITE_OTHER): Payer: Medicare Other | Admitting: Gastroenterology

## 2021-02-25 ENCOUNTER — Encounter: Payer: Self-pay | Admitting: Gastroenterology

## 2021-02-25 VITALS — BP 120/84 | HR 95 | Ht 64.0 in | Wt 145.4 lb

## 2021-02-25 DIAGNOSIS — R197 Diarrhea, unspecified: Secondary | ICD-10-CM | POA: Diagnosis not present

## 2021-02-25 MED ORDER — RIFAXIMIN 550 MG PO TABS: 550.0000 mg | ORAL_TABLET | Freq: Three times a day (TID) | ORAL | 0 refills | Status: DC

## 2021-02-25 NOTE — Progress Notes (Signed)
02/25/2021 Kimberly Browning [id number] 03/31/1976   HISTORY OF PRESENT ILLNESS: This is a 45 year old female who is a patient of Dr. Ardis Hughs.  She has been seen by Nicoletta Ba, PA-C, on a couple of occasions in our office and had an EGD with Dr. Ardis Hughs.  Her last visit here was in June.  Since then she has developed a new issue of diarrhea.  There are multiple phone calls regarding this issue.  She is convinced that this diarrhea started back in July after she ate a burrito.  Stool studies were negative.  She started Dexilant back on July 12 and she started Trulicity back in May or June.  She has been advised by our office and also her PCPs office that the Trulicity very well could be the cause of her diarrhea, but she is not convinced of that and insisted on increasing the dose.  When I entered the room today she stated "so I am not seeing my doctor today?  Don't I have a doctor here?"   Past Medical History:  Diagnosis Date   Anxiety    Bipolar disorder ([place])    Depression    Dry skin    hands   Feeling of incomplete bladder emptying    GERD (gastroesophageal reflux disease)    Hypercholesteremia    Nocturia    Sleep apnea    Urge urinary incontinence    Vitamin D deficiency    Wears contact lenses    Past Surgical History:  Procedure Laterality Date   BREAST REDUCTION SURGERY  05/10/2011   Procedure: MAMMARY REDUCTION BILATERAL (BREAST);  Surgeon: Hermelinda Dellen;  Location: [facility];  Service: Plastics;  Laterality: Bilateral;  bilateral breast reduction   DILATION AND EVACUATION  12/26/2008   x 2   INTERSTIM IMPLANT PLACEMENT N/A 07/09/2014   Procedure: Barrie Lyme IMPLANT FIRST STAGE;  Surgeon: Reece Packer, MD;  Location: [facility];  Service: Urology;  Laterality: N/A;   INTERSTIM IMPLANT PLACEMENT N/A 07/09/2014   Procedure: Barrie Lyme IMPLANT SECOND STAGE;  Surgeon: Reece Packer, MD;  Location: [facility];   Service: Urology;  Laterality: N/A;   INTRAUTERINE DEVICE (IUD) INSERTION  sept 2011   mirena   WISDOM TOOTH EXTRACTION  as a teenager    reports that she quit smoking about 17 months ago. Her smoking use included cigarettes. She has a 4.75 pack-year smoking history. She has never used smokeless tobacco. She reports that she does not currently use alcohol. She reports that she does not use drugs. family history is not on file. She was adopted. Allergies  Allergen Reactions   Geodon [Ziprasidone Hydrochloride] Shortness Of Breath and Swelling   Hibiclens [Chlorhexidine] Itching    Redness and itching after using CHG wipes   Sulfamethoxazole-Trimethoprim Other (See Comments)    hallucinations   Doxycycline Other (See Comments)    Unknown reaction   Gabapentin Other (See Comments)    Unknown reaction   Other Other (See Comments)    Bath and body works body wash and lotion caused itching, reddness and rash   Penicillins Other (See Comments)    Unknown reaction Did it involve swelling of the face/tongue/throat, SOB, or low BP? Unknown Did it involve sudden or severe rash/hives, skin peeling, or any reaction on the inside of your mouth or nose? Unknown Did you need to seek medical attention at a hospital or doctor's office? Unknown When did it last happen?  unknown  If all above answers are "NO", may proceed with cephalosporin use.   Promethazine Other (See Comments)   Depakote [Divalproex Sodium] Rash      Outpatient Encounter Medications as of 02/25/2021  Medication Sig   amantadine (SYMMETREL) 100 MG capsule Take 100 mg by mouth daily.   AMBULATORY NON FORMULARY MEDICATION Medication Name: GI Cocktail   270 mL Viscous Xylocaine 2%, 270 mL Dicyclomine 10 mg/5 mL, 810 mL Mylanta    Sig: Take 30 mL by mouth twice daily as needed for burning sensation.   BELSOMRA 20 MG TABS Take 1 tablet by mouth at bedtime.   dexlansoprazole (DEXILANT) 60 MG capsule Take 1 capsule (60 mg total) by  mouth daily before breakfast.   dicyclomine (BENTYL) 10 MG capsule Take 1 capsule (10 mg total) by mouth 3 (three) times daily before meals.   Dulaglutide (TRULICITY) 1.5 NF/6.2ZH SOPN Inject 1.5 mg into the skin once a week.   famotidine (PEPCID) 20 MG tablet Take 1 tablet (20 mg total) by mouth in the morning AND 2 tablets (40 mg total) at bedtime.   Insulin Pen Needle (BD PEN NEEDLE NANO U/F) 32G X 4 MM MISC 1 application by Does not apply route daily.   lamoTRIgine (LAMICTAL) 200 MG tablet Take 300 mg by mouth daily.   metoCLOPramide (REGLAN) 10 MG tablet Take 1 tablet (10 mg total) by mouth every 6 (six) hours as needed for nausea or vomiting.   mirtazapine (REMERON) 30 MG tablet Take 30 mg by mouth at bedtime.   paragard intrauterine copper IUD IUD 1 each by Intrauterine route once.   temazepam (RESTORIL) 15 MG capsule Take 3 capsules by mouth daily.   No facility-administered encounter medications on file as of 02/25/2021.     REVIEW OF SYSTEMS  : All other systems reviewed and negative except where noted in the History of Present Illness.   PHYSICAL EXAM: BP 120/84   Pulse 95   Ht 5\' 4"  (1.626 m)   Wt 145 lb 6 oz (65.9 kg)   BMI 24.95 kg/m  General: Well developed white female in no acute distress Head: Normocephalic and atraumatic Eyes:  Sclerae anicteric, conjunctiva pink. Ears: Normal auditory acuity Lungs: Clear throughout to auscultation; no W/R/R. Heart: Regular rate and rhythm; no M/R/G. Abdomen: Soft, non-distended.  BS present.  Non-tender. Musculoskeletal: Symmetrical with no gross deformities  Skin: No lesions on visible extremities Extremities: No edema  Neurological: Alert oriented x 4, grossly non-focal Psychological:  Alert and cooperative. Normal mood and affect  ASSESSMENT AND PLAN: *Diarrhea: This has been a new issue since her last visit here in June.  There are multiple conversations via phone call about this.  She started Dexilant on July 12 and just  started Trulicity around May or June as well.  She has been advised that the Trulicity very well could be the cause of her diarrhea, but she was insisted on increasing the dose instead of discontinuing it to see if her diarrhea would get better.  Dexilant can also cause diarrhea in some people.  She is convinced that her diarrhea started after eating a burrito back in July.  Stool studies were negative.  She is obviously diabetic so I am going to see if we can get Xifaxan and treat her with a 14-day course of that 3 times a day for possible SIBO.  If is not covered then we could try a course of Flagyl instead.  Her PCP has even documented that they think  the Trulicity could be causing her diarrhea, but she cannot be convinced of that.  She was also advised that dicyclomine does not cause insomnia, but possibly somnolence and she was advised of the difference.  She can use that as needed.   CC:  [name], DO

## 2021-02-25 NOTE — Patient Instructions (Signed)
If you are age 44 or younger, your body mass index should be between 19-25. Your Body mass index is 24.95 kg/m. If this is out of the aformentioned range listed, please consider follow up with your Primary Care Provider.  __________________________________________________________  The  GI providers would like to encourage you to use [facility] to communicate with providers for non-urgent requests or questions.  Due to long hold times on the telephone, sending your provider a message by [facility] may be a faster and more efficient way to get a response.  Please allow 48 business hours for a response.  Please remember that this is for non-urgent requests.   We have sent Xifaxan 550 mg to your pharmacy. Take 1 tablet 3 times daily for 14 days.  Thank you for entrusting me with your care and choosing [facility].  Alonza Bogus, PA-C

## 2021-02-25 NOTE — Progress Notes (Signed)
I agree with the above note, plan 

## 2021-02-26 MED ORDER — METRONIDAZOLE 250 MG PO TABS: 250.0000 mg | ORAL_TABLET | Freq: Three times a day (TID) | ORAL | 0 refills | Status: AC

## 2021-02-26 NOTE — Telephone Encounter (Signed)
Flagyl has been sent to patient's pharmacy

## 2021-02-26 NOTE — Telephone Encounter (Signed)
Advised patient that Flagyl was sent to help with any infection she may have. I advised that it should not make her feel worse, but every medication has side effect as well.

## 2021-02-26 NOTE — Telephone Encounter (Signed)
Inbound call from patient with questions about flagyl and the side effects.

## 2021-03-02 NOTE — Telephone Encounter (Signed)
Inbound call from patient. States Flagyl left a bad taste in her mouth all day Saturday and cause her to vomit. She says night time is worse. Best contact [phone]

## 2021-03-03 NOTE — Telephone Encounter (Signed)
Tried to reach out to patient, no answer, unable to leave voicemail.

## 2021-03-04 NOTE — Telephone Encounter (Signed)
2nd attempt to contact patient unable to leave voicemail.

## 2021-03-05 ENCOUNTER — Other Ambulatory Visit: Payer: Self-pay

## 2021-03-05 ENCOUNTER — Ambulatory Visit (INDEPENDENT_AMBULATORY_CARE_PROVIDER_SITE_OTHER): Payer: Medicare Other | Admitting: Student

## 2021-03-05 ENCOUNTER — Telehealth: Payer: Self-pay

## 2021-03-05 ENCOUNTER — Encounter: Payer: Self-pay | Admitting: Student

## 2021-03-05 VITALS — BP 113/81 | HR 81 | Ht 64.0 in | Wt 144.2 lb

## 2021-03-05 DIAGNOSIS — Z30432 Encounter for removal of intrauterine contraceptive device: Secondary | ICD-10-CM

## 2021-03-05 DIAGNOSIS — Z30011 Encounter for initial prescription of contraceptive pills: Secondary | ICD-10-CM

## 2021-03-05 MED ORDER — NORGESTIM-ETH ESTRAD TRIPHASIC 0.18/0.215/0.25 MG-25 MCG PO TABS: 1.0000 | ORAL_TABLET | Freq: Every day | ORAL | 3 refills | Status: DC

## 2021-03-05 NOTE — Patient Instructions (Signed)
It was great meeting you, Kimberly Browning.   We removed your copper IUD today, and I prescribed Sprintec (an oral birth control pill) which was sent to your pharmacy.. This should help reduce your bleeding.  Expect to have some mild spotting/bleeding for the next 2-3 days. This is normal after IUD removal. Start taking your birth control pill today as fertility immediately returns following IUD removal.  Please seek urgent care if you develop fever >100.4, uncontrolled pain, bleeding through more than 2 pads per hour.  As always, call with any questions or concerns you may have.  Best wishes, Dr. Orvis Brill [facility] [phone]

## 2021-03-05 NOTE — Telephone Encounter (Signed)
Final attempt, unable to reach patient or leave voicemail.

## 2021-03-05 NOTE — Progress Notes (Signed)
    SUBJECTIVE:   CHIEF COMPLAINT / HPI:   Patient desiring IUD removal Patient had paraguard IUD that was placed on 04/04/2020. Reported she has had increased bleeding monthly lasting 2 weeks in length that has been bothersome. She has also had periods of cramping "lasting seconds." No pain with intercourse. She does not want the IUD anymore as she feels the side effects outweigh the benefits.  PROCEDURE NOTE: IUD REMOVAL  Patient given informed consent for IUD removal. She is aware that fertility will return following removal of IUD. Patient placed in the lithotomy position and the cervix brought into view using speculum. The IUD strings were identified coming from the cervical os. These strings were grasped with ring forceps, and the IUD withdrawn gently from the uterus. There were no complications and scant blood loss. Patient tolerated the procedure well.  She was given handouts for post procedure instructions.  PERTINENT  PMH / PSH:  IUD contraception  OBJECTIVE:   BP 113/81   Pulse 81   Ht 5\' 4"  (1.626 m)   Wt 144 lb 3.2 oz (65.4 kg)   SpO2 100%   BMI 24.75 kg/m   General: Well-appearing, in no distress Respiratory: Normal respiratory effort. No increased work of breathing. GU: Normal external female genitalia. Normal ruggation of vaginal walls. No cervical discharge. IUD strings visualized prior to removal. Cervical os closed. Psych: Normal mood and affect.   ASSESSMENT/PLAN:  Encounter for IUD removal Patient's IUD removed successfully and tolerated the procedure well. She was counseled on alternative methods of contraception and would like oral contraceptive pills. I educated her to use condoms until then.  Contraceptive Management Discussed various contraceptive options. Patient desired OCP. Has tried Nexplanon and Mirena in the past, which she did not like. Encouraged condom use (as fertility returns following IUD removal) until she starts taking Norgestimate-Ethynyl  Estradiol Triphasic (Orthotriciclen-lo) which was sent to her pharmacy. All questions were answered regarding side effects, although risk of these is minimal. Discussed efficacy and importance of adherence to medication. Patient has no contraindications to OCP use including hypertension, tobacco use, history of VTE/PE, family history of VTE/PE, migraines with aura. - Start Ortho tri-cyclen lo 1 pill daily - Follow-up as needed  Orvis Brill, [place]

## 2021-03-05 NOTE — Telephone Encounter (Signed)
Patient calls nurse line x 3 regarding concerns with birth control pill side effects. Patient is requesting to speak with provider as soon as possible regarding this medication.    Please advise.   Talbot Grumbling, RN

## 2021-03-05 NOTE — Telephone Encounter (Signed)
I called and spoke to Kimberly Browning regarding her concern's about side effects (after picking up the medication today and reading the package insert) for her Ortho tri-cyclen lo. She is most worried about headaches, weight gain and vomiting which are listed as potential adverse effects. I advised her that although these side effects are listed (and required by the manufacturer with medication distribution), that it does not mean she will experience them.  She is amenable to trying the medication for 1 month and returning for follow-up. I told her we have many different contraceptive options if she does not tolerate this one one. All questions were answered to patient's satisfaction.

## 2021-03-09 NOTE — Telephone Encounter (Signed)
Returned call to patient. Discussed completing 7 day course she. She did not complete the course and has 16 pills left. She has stopped taking Dexilant daily, but instead takes it when she needs it. Her Diarrhea has slightly improved, but she "wants to be back to normal." She has been taking imodium as previously advised to do, but only when she needs it. Have advised her to try to start taking one every morning until her stools become more solid.

## 2021-03-09 NOTE — Telephone Encounter (Signed)
Patient is returning your call.  

## 2021-03-10 ENCOUNTER — Ambulatory Visit: Payer: Medicare Other | Admitting: Gastroenterology

## 2021-03-18 ENCOUNTER — Other Ambulatory Visit: Payer: Self-pay

## 2021-03-18 MED ORDER — TRULICITY 1.5 MG/0.5ML ~~LOC~~ SOAJ: 1.5000 mg | SUBCUTANEOUS | 1 refills | Status: DC

## 2021-03-19 ENCOUNTER — Telehealth: Payer: Self-pay | Admitting: Gastroenterology

## 2021-03-19 ENCOUNTER — Other Ambulatory Visit: Payer: Self-pay | Admitting: Family Medicine

## 2021-03-19 ENCOUNTER — Telehealth: Payer: Self-pay

## 2021-03-19 MED ORDER — TRULICITY 0.75 MG/0.5ML ~~LOC~~ SOAJ: 0.7500 mg | SUBCUTANEOUS | 1 refills | Status: DC

## 2021-03-19 NOTE — Telephone Encounter (Signed)
Returned call to the pt and she did not ask for any advise on the diarrhea but wanted to know when she was last seen.  I told her 9/21 was the date of her last office visit with Alonza Bogus, PA.  She thanked me and states she is seeing her PCP to discuss meds that may be causing her diarrhea. She will call back if she decides to follow up with our office.

## 2021-03-19 NOTE — Progress Notes (Signed)
Discussed with patient over the phone regarding continued diarrhea. Could be related to Trulicity. Shared decision making to decrease dose to 0.75 mg/week. I have sent a new script for this. I will follow up with her Nov 1st.

## 2021-03-19 NOTE — Telephone Encounter (Signed)
Patient calls nurse line regarding ongoing intermittent diarrhea. Patient states this has been going on since July. Patient was wondering if Trulicity may be causing the diarrhea. Patient is asking if doctor recommends decreasing dosage. Patient has follow up with PCP on 04/07/2021.  Please advise.   Talbot Grumbling, RN

## 2021-03-19 NOTE — Telephone Encounter (Signed)
Pt called stating she is still having the same issues with diarrhea.  Please call to advise.

## 2021-03-20 ENCOUNTER — Other Ambulatory Visit: Payer: Self-pay

## 2021-03-21 ENCOUNTER — Other Ambulatory Visit: Payer: Self-pay | Admitting: Family Medicine

## 2021-03-23 NOTE — Telephone Encounter (Signed)
Patient calling back requesting to speak with a nurse again.  States is still having diarrhea which is not caused by medications she's taking.  Please advise.

## 2021-03-23 NOTE — Telephone Encounter (Signed)
Left message on machine to call back  

## 2021-03-24 NOTE — Telephone Encounter (Signed)
The pt states that she continues to have diarrhea and last night was the worst.  She has not followed up with her PCP as of yet. She states her appt is not until November 1.  She has not been taking imodium as recommended.  She will start imodium and take first thing in the morning and later in the day as needed.  She will keep appt with PCP.  Forwarded to [facility] for any further recommendations.

## 2021-03-31 ENCOUNTER — Telehealth: Payer: Self-pay | Admitting: Gastroenterology

## 2021-03-31 NOTE — Telephone Encounter (Signed)
Inbound call from patient. States her IBS is so servere and she would like to know what she can eat to help decrease the diarrhea until her appt 11/10. States she have tried the imodium but can't do it all day because she run out and she is unable to just go to the store to get more.

## 2021-03-31 NOTE — Telephone Encounter (Signed)
The pt states that when she takes imodium it works but she runs out in a week.  She buys the box of 12 and it doesn't last.  I advised her to try and get a larger box or get 2 of the 12 packs so it can last until her appt.  I also advised her that [facility] [employer] carries it for $1.25.  She will keep the appt on 11/10 to discuss. She was also advised to avoid very spicy fried foods and stay on a bland diet until the appt.  She was told to make sure she is getting enough liquids.

## 2021-04-06 NOTE — Progress Notes (Signed)
SUBJECTIVE:   CHIEF COMPLAINT / HPI:   F/u Trulicity  Diarrhea  Kimberly Browning is a 45 y.o. female who presents to the clinic today for follow up on her Trulicity. Patient has been using this for weight loss. Her starting weight on 1/13 was 180 pounds. Today she is 140 lbs (down 4 pounds since 9/21 visit). She has also been experiencing diarrhea and was counseled that this can be a side effect of the medication. She has followed up with GI for this and had a stool study performed in September that was negative. Most recently, she was recommended to try taking Imodium for the diarrhea. She is concerned she has IBS since her sister has it. Her Trulicity dose was decreased on 10/13 in an effort to decrease adverse effects. She was a follow up with GI on 11/10. She is unsure if she has had blood in her stool, "I'm too busy trying to get up and go out the door". No fevers. Has some abdominal pain, "cramping".   Birth Control F/U Patient was seen on 9/29 and had her IUD removed due to increased monthly bleeding. She was then given a prescription for oral contraceptive pills. She notes she has not been taking them every day. She started taking them for about a week but then stopped because she felt it was keeping her awake. She notes she hasn't her last two periods. She denies having any sexual intercourse since IUD was removed. States that she has not seen her partner since May/June and has been abstinent since then.    PERTINENT  PMH / PSH:  Past Medical History:  Diagnosis Date   Anxiety    Bipolar disorder ([place])    Depression    Dry skin    hands   Feeling of incomplete bladder emptying    GERD (gastroesophageal reflux disease)    Hypercholesteremia    Nocturia    Sleep apnea    Urge urinary incontinence    Vitamin D deficiency    Wears contact lenses     OBJECTIVE:   BP 105/73   Pulse 77   Wt 140 lb (63.5 kg)   SpO2 98%   BMI 24.03 kg/m    General: NAD, pleasant, able to  participate in exam, MMM Cardiac: RRR, no murmurs. Respiratory: CTAB, normal effort, No wheezes, rales or rhonchi Abdomen: Bowel sounds present, non-tender in all quadrants, nondistended, no hepatosplenomegaly. Extremities: no edema or cyanosis. Skin: warm and dry, no rashes noted Psych: Pressured speech  ASSESSMENT/PLAN:   Diarrhea Patient has had ongoing diarrhea for the last several months.  Her GI pathogen panel was negative.  She has seen GI for this and advised to take Imodium.  We discussed in the past that this could be related to her Trulicity.  Patient was previously not interested in discontinuing her Trulicity as she continues to want weight loss.  Given that her diarrhea is impacting her quality of life, I strongly encouraged her to stop her Trulicity to see if this helps.  She is finally amenable to doing this and will not take her Thursday dose.  She has close follow-up with GI on 11/10. Symptoms appear either medication related or IBS-diarrhea or both. She is well-hydrated today and with negative GIPP and no fevers, doubt infectious process contributing. Her abdominal examination was completely benign today.  General counselling and advice on contraception We discussed that it can take a few months for periods to return after IUD removal.  I recommended a pregnancy test but patient reassured me that there was no possibility of being pregnant as she has been absent for the last 5 to 6 months. I advised her to protect herself with barrier protection when having intercourse. She was not interested in another IUD, a nexplanon, OCP's or any option that has the potential for weight gain.     [name], [place]

## 2021-04-06 NOTE — Patient Instructions (Addendum)
It was wonderful to see you today.  Today we talked about:  -I recommend STOPPING Trulicity. Do NOT take another dose on Thursday. It is very possible that your diarrhea is related to this medication. -It is important to keep hydrated.  -Follow up with your Gastroenterologist as scheduled. -It can take up to 3 months for your period to return after IUD removal. Please contact us if you still do not have your period in the next couple months. Since you are no longer on birth control, it is important that you use protection (condoms) to prevent pregnancy.   Thank you for choosing [facility].   Please call [phone] with any questions about today's appointment.  Please be sure to schedule follow up at the front  desk before you leave today.   [name], DO PGY-2 Family Medicine

## 2021-04-07 ENCOUNTER — Other Ambulatory Visit: Payer: Self-pay

## 2021-04-07 ENCOUNTER — Encounter: Payer: Self-pay | Admitting: Family Medicine

## 2021-04-07 ENCOUNTER — Ambulatory Visit (INDEPENDENT_AMBULATORY_CARE_PROVIDER_SITE_OTHER): Payer: Medicare Other | Admitting: Family Medicine

## 2021-04-07 DIAGNOSIS — Z3009 Encounter for other general counseling and advice on contraception: Secondary | ICD-10-CM | POA: Diagnosis not present

## 2021-04-07 DIAGNOSIS — R197 Diarrhea, unspecified: Secondary | ICD-10-CM | POA: Diagnosis not present

## 2021-04-07 NOTE — Assessment & Plan Note (Signed)
We discussed that it can take a few months for periods to return after IUD removal.  I recommended a pregnancy test but patient reassured me that there was no possibility of being pregnant as she has been absent for the last 5 to 6 months. I advised her to protect herself with barrier protection when having intercourse. She was not interested in another IUD, a nexplanon, OCP's or any option that has the potential for weight gain.

## 2021-04-07 NOTE — Assessment & Plan Note (Signed)
Patient has had ongoing diarrhea for the last several months.  Her GI pathogen panel was negative.  She has seen GI for this and advised to take Imodium.  We discussed in the past that this could be related to her Trulicity.  Patient was previously not interested in discontinuing her Trulicity as she continues to want weight loss.  Given that her diarrhea is impacting her quality of life, I strongly encouraged her to stop her Trulicity to see if this helps.  She is finally amenable to doing this and will not take her Thursday dose.  She has close follow-up with GI on 11/10. Symptoms appear either medication related or IBS-diarrhea or both. She is well-hydrated today and with negative GIPP and no fevers, doubt infectious process contributing. Her abdominal examination was completely benign today.

## 2021-04-16 ENCOUNTER — Encounter: Payer: Self-pay | Admitting: Gastroenterology

## 2021-04-16 ENCOUNTER — Ambulatory Visit (INDEPENDENT_AMBULATORY_CARE_PROVIDER_SITE_OTHER): Payer: Medicare Other | Admitting: Gastroenterology

## 2021-04-16 ENCOUNTER — Other Ambulatory Visit (INDEPENDENT_AMBULATORY_CARE_PROVIDER_SITE_OTHER): Payer: Medicare Other

## 2021-04-16 VITALS — BP 110/70 | HR 80 | Ht 63.75 in | Wt 142.1 lb

## 2021-04-16 DIAGNOSIS — R197 Diarrhea, unspecified: Secondary | ICD-10-CM

## 2021-04-16 LAB — C-REACTIVE PROTEIN: CRP: <1 mg/dL (ref 0.5–20.0)

## 2021-04-16 LAB — SEDIMENTATION RATE: Sed Rate: 19 mm/hr (ref 0–20)

## 2021-04-16 LAB — TSH: TSH: 3.05 u[IU]/mL (ref 0.35–5.50)

## 2021-04-16 MED ORDER — NA SULFATE-K SULFATE-MG SULF 17.5-3.13-1.6 GM/177ML PO SOLN: 1.0000 | Freq: Once | ORAL | 0 refills | Status: AC

## 2021-04-16 MED ORDER — DIPHENOXYLATE-ATROPINE 2.5-0.025 MG PO TABS: 1.0000 | ORAL_TABLET | Freq: Four times a day (QID) | ORAL | 1 refills | Status: DC | PRN

## 2021-04-16 NOTE — Patient Instructions (Signed)
If you are age 44 or older, your body mass index should be between 23-30. Your Body mass index is 24.59 kg/m. If this is out of the aforementioned range listed, please consider follow up with your Primary Care Provider.  If you are age 68 or younger, your body mass index should be between 19-25. Your Body mass index is 24.59 kg/m. If this is out of the aformentioned range listed, please consider follow up with your Primary Care Provider.   You have been scheduled for a colonoscopy. Please follow written instructions given to you at your visit today.  Please pick up your prep supplies at the pharmacy within the next 1-3 days. If you use inhalers (even only as needed), please bring them with you on the day of your procedure.  Your provider has requested that you go to the basement level for lab work before leaving today. Press "B" on the elevator. The lab is located at the first door on the left as you exit the elevator.  The [facility] providers would like to encourage you to use [facility] to communicate with providers for non-urgent requests or questions.  Due to long hold times on the telephone, sending your provider a message by [facility] may be a faster and more efficient way to get a response.  Please allow 48 business hours for a response.  Please remember that this is for non-urgent requests.   Due to recent changes in healthcare laws, you may see the results of your imaging and laboratory studies on MyChart before your provider has had a chance to review them.  We understand that in some cases there may be results that are confusing or concerning to you. Not all laboratory results come back in the same time frame and the provider may be waiting for multiple results in order to interpret others.  Please give [place] 48 hours in order for your provider to thoroughly review all the results before contacting the office for clarification of your results.    It was a pleasure to see you today!  Thank you  for trusting me with your gastrointestinal care!    Alonza Bogus, PA-C

## 2021-04-16 NOTE — Progress Notes (Signed)
04/16/2021 Kimberly Browning [id number] [birth year]   HISTORY OF PRESENT ILLNESS: This is a 45 year old female who is a patient Dr. Ardis Hughs.  Previously she had been seen here for issues with GERD.  Then most recently she was seen by me in September with complaints of diarrhea.  She had stated that this started after eating a burrito back in July.  GI pathogen panel was negative.  Several people thought that her diarrhea could be from Trulicity or Dexilant, but she did not want to except that or try stopping the medications.  We tried to treat her with Xifaxan for possible SIBO, but it was too expensive.  Then we prescribed Flagyl, but she had called complaining of side effects and she cannot recall if she completed that course.  She did end up stopping the Trulicity 2 weeks ago and says that she is still having diarrhea.  She is only using the Dexilant intermittently as needed.  She is asking about IBS.  Says that her sister has IBS.  She had never discussed diarrhea as an issue prior to just recent encounters.  She is somewhat a limited historian.  Looks like she had a colonoscopy in 2015 and she says that she had similar issues at that time.  Looks like colonoscopy was performed through [facility] and I cannot see those actual records/reports.  She tends to laugh inappropriately out loud at times during our visit.  She stated when referring to the restaurant where she apparently ate the burrito that she thought had given her the diarrhea "too bad I cannot sue the restaurant".   Past Medical History:  Diagnosis Date   Anxiety    Bipolar disorder ([place])    Depression    Dry skin    hands   Feeling of incomplete bladder emptying    GERD (gastroesophageal reflux disease)    Hypercholesteremia    Nocturia    Sleep apnea    Urge urinary incontinence    Vitamin D deficiency    Wears contact lenses    Past Surgical History:  Procedure Laterality Date   BREAST REDUCTION SURGERY  05/10/2011   Procedure:  MAMMARY REDUCTION BILATERAL (BREAST);  Surgeon: Hermelinda Dellen;  Location: [facility];  Service: Plastics;  Laterality: Bilateral;  bilateral breast reduction   DILATION AND EVACUATION  12/26/2008   x 2   INTERSTIM IMPLANT PLACEMENT N/A 07/09/2014   Procedure: Barrie Lyme IMPLANT FIRST STAGE;  Surgeon: Reece Packer, MD;  Location: [facility];  Service: Urology;  Laterality: N/A;   INTERSTIM IMPLANT PLACEMENT N/A 07/09/2014   Procedure: Barrie Lyme IMPLANT SECOND STAGE;  Surgeon: Reece Packer, MD;  Location: [facility];  Service: Urology;  Laterality: N/A;   INTRAUTERINE DEVICE (IUD) INSERTION  sept 2011   mirena   WISDOM TOOTH EXTRACTION  as a teenager    reports that she quit smoking about 18 months ago. Her smoking use included cigarettes. She has a 4.75 pack-year smoking history. She has never used smokeless tobacco. She reports that she does not currently use alcohol. She reports that she does not use drugs. family history is not on file. She was adopted. Allergies  Allergen Reactions   Geodon [Ziprasidone Hydrochloride] Shortness Of Breath and Swelling   Hibiclens [Chlorhexidine] Itching    Redness and itching after using CHG wipes   Sulfamethoxazole-Trimethoprim Other (See Comments)    hallucinations   Doxycycline Other (See Comments)    Unknown reaction  Gabapentin Other (See Comments)    Unknown reaction   Other Other (See Comments)    B[employer] and body works body wash and lotion caused itching, reddness and rash   Penicillins Other (See Comments)    Unknown reaction Did it involve swelling of the face/tongue/throat, SOB, or low BP? Unknown Did it involve sudden or severe rash/hives, skin peeling, or any reaction on the inside of your mouth or nose? Unknown Did you need to seek medical attention at a hospital or doctor's office? Unknown When did it last happen?  unknown     If all above answers are "NO", may proceed with  cephalosporin use.   Promethazine Other (See Comments)   Depakote [Divalproex Sodium] Rash      Outpatient Encounter Medications as of 04/16/2021  Medication Sig   amantadine (SYMMETREL) 100 MG capsule Take 100 mg by mouth daily.   AMBULATORY NON FORMULARY MEDICATION Medication Name: GI Cocktail   270 mL Viscous Xylocaine 2%, 270 mL Dicyclomine 10 mg/5 mL, 810 mL Mylanta    Sig: Take 30 mL by mouth twice daily as needed for burning sensation.   BELSOMRA 20 MG TABS Take 1 tablet by mouth at bedtime.   dexlansoprazole (DEXILANT) 60 MG capsule Take 1 capsule (60 mg total) by mouth daily before breakfast. (Patient taking differently: Take 60 mg by mouth as needed.)   famotidine (PEPCID) 20 MG tablet Take 1 tablet (20 mg total) by mouth in the morning AND 2 tablets (40 mg total) at bedtime. (Patient taking differently: As needed)   Insulin Pen Needle (BD PEN NEEDLE NANO U/F) 32G X 4 MM MISC 1 application by Does not apply route daily.   lamoTRIgine (LAMICTAL) 200 MG tablet Take 300 mg by mouth daily.   metoCLOPramide (REGLAN) 10 MG tablet TAKE 1 TABLET BY MOUTH EVERY 6 HOURS AS NEEDED FOR NAUSEA FOR VOMITING   mirtazapine (REMERON) 30 MG tablet Take 30 mg by mouth at bedtime.   Norgestimate-Ethinyl Estradiol Triphasic (ORTHO TRI-CYCLEN LO) 0.18/0.215/0.25 MG-25 MCG tab Take 1 tablet by mouth daily.   temazepam (RESTORIL) 15 MG capsule Take 3 capsules by mouth daily.   Dulaglutide (TRULICITY) 2.58 NI/7.7OE SOPN Inject 0.75 mg into the skin once a week. (Patient not taking: Reported on 04/16/2021)   [DISCONTINUED] dicyclomine (BENTYL) 10 MG capsule Take 1 capsule (10 mg total) by mouth 3 (three) times daily before meals.   No facility-administered encounter medications on file as of 04/16/2021.     REVIEW OF SYSTEMS  : All other systems reviewed and negative except where noted in the History of Present Illness.   PHYSICAL EXAM: BP 110/70 (BP Location: Left Arm, Patient Position: Sitting, Cuff  Size: Normal)   Pulse 80   Ht 5' 3.75" (1.619 m)   Wt 142 lb 2 oz (64.5 kg)   BMI 24.59 kg/m  General: Well developed white female in no acute distress Head: Normocephalic and atraumatic Eyes:  Sclerae anicteric, conjunctiva pink. Ears: Normal auditory acuity Lungs: Clear throughout to auscultation; no W/R/R. Heart: Regular rate and rhythm; no M/R/G. Abdomen: Soft, non-distended.  BS present.  Non-tender. Rectal:  Will be done at the time of colonoscopy. Musculoskeletal: Symmetrical with no gross deformities  Skin: No lesions on visible extremities Extremities: No edema  Neurological: Alert oriented x 4, grossly non-focal Psychological:  Alert and cooperative. Normal mood and affect  ASSESSMENT AND PLAN: *Diarrhea: She indicated that this was an acute issue that began back in July after eating a burrito.  There was no mention of this at other visits that she had had here.  Stool GI pathogen panel is negative.  She has been off of her Trulicity for 2 weeks and says that she is still having diarrhea.  She only takes her Dexilant as needed/on occasion.  She indicates that she had some issues similar to this when she had a colonoscopy in the past.  Looks like that was in 2015 and I do not have records of that specifically.  She says Imodium does not help.  She is asking about IBS.  I think we need to repeat colonoscopy to rule out IBD and microscopic colitis as well.  We will check labs including sed rate, CRP, TSH, celiac labs.  We will try Lomotil in the interim.  New prescription sent to pharmacy.  She has been scheduled for colonoscopy with Dr. Ardis Hughs  The risks, benefits, and alternatives to colonoscopy were discussed with the patient and she consents to proceed.    CC:  [name], DO

## 2021-04-17 ENCOUNTER — Other Ambulatory Visit: Payer: Self-pay | Admitting: Family Medicine

## 2021-04-17 ENCOUNTER — Ambulatory Visit: Payer: Medicare Other | Admitting: Family Medicine

## 2021-04-17 ENCOUNTER — Telehealth: Payer: Self-pay

## 2021-04-17 LAB — TISSUE TRANSGLUTAMINASE, IGA: (tTG) Ab, IgA: <1 U/mL

## 2021-04-17 LAB — IGA: Immunoglobulin A: 115 mg/dL (ref 47–310)

## 2021-04-17 NOTE — Telephone Encounter (Signed)
Patient calls nurse line stating she stopped Trulicity two weeks ago yesterday. Patient reports she stopped the medication because she thought it was causing her diarrhea. Patient reports she has been seeing GI and was told Trulicity is not the cause.   Patient would like to restart Trulicity this week. Please advise if "safe" to start normal dose.

## 2021-04-17 NOTE — Progress Notes (Signed)
I agree with the above note, plan 

## 2021-04-17 NOTE — Progress Notes (Signed)
Discussed with patient about restarting Trulicity.  She strongly believes that it was not contributing to her diarrhea because her symptoms started prior to using it.  She is also still having diarrhea even despite stopping her Trulicity for 2 weeks.  She would like to continue back on the lowest dose for weight loss that she does not want to "move backwards".  She had a visit with GI yesterday and it looks like they are going to proceed with a colonoscopy next month and do blood work to rule out other causes of her ongoing diarrhea.  Okay for patient to restart Trulicity at 1.22 mg a week.  I will prefer that she stay on this low-dose during this time until her work-up is complete. She was agreeable to the plan.  Advised her that if she had any nausea, vomiting, worsening diarrhea, or abdominal pain with restarting Trulicity to please let me know.

## 2021-04-20 ENCOUNTER — Telehealth: Payer: Self-pay | Admitting: Gastroenterology

## 2021-04-20 NOTE — Telephone Encounter (Signed)
Inbound call from patient requesting a call from a nurse; has additional questions about lab results.

## 2021-04-20 NOTE — Telephone Encounter (Signed)
Returned call to the pt and we discussed her lab results.  She will await colon results after completed and resulted.

## 2021-04-27 ENCOUNTER — Other Ambulatory Visit: Payer: Self-pay

## 2021-04-27 ENCOUNTER — Ambulatory Visit (INDEPENDENT_AMBULATORY_CARE_PROVIDER_SITE_OTHER): Payer: Medicare Other | Admitting: Student

## 2021-04-27 ENCOUNTER — Encounter: Payer: Self-pay | Admitting: Student

## 2021-04-27 DIAGNOSIS — Q678 Other congenital deformities of chest: Secondary | ICD-10-CM

## 2021-04-27 NOTE — Patient Instructions (Addendum)
It was a pleasure to see you today! Thank you for choosing [facility] for your primary care.  Our plans for today were: Continue to monitor the area  Please let us know if there are any changes to the area  Check it once every 2 weeks Come back and see [place] for repeat check of area in 2 months    You should return to our clinic to see [place] in 2 months  Best,  Anel Creighton [employer]

## 2021-04-27 NOTE — Progress Notes (Addendum)
    SUBJECTIVE:   CHIEF COMPLAINT / HPI:   Chest Wall Lump: She is unsure when it started, but she noticed the lump at the beginning of the month. The location is on the left chest wall. No known FMHx of breast cancer, adopted.  Lump is not painful.  She has noticed no skin changes or nipple discharge.   Last mammogram 08/2020 was negative.    PERTINENT  PMH / PSH: HLD, bipolar, GERD, depression/anxiety   OBJECTIVE:   BP 110/75   Pulse 86   Ht 5\' 4"  (1.626 m)   Wt 141 lb 6.4 oz (64.1 kg)   SpO2 100%   BMI 24.27 kg/m   General: Alert and oriented in no apparent distress Heart: Regular rate and rhythm with no murmurs appreciated Lungs: CTA bilaterally, no wheezing Abdomen: Bowel sounds present, no abdominal pain Skin: Warm and dry Extremities: No lower extremity edema Breast exam: Fibrocystic changes noted in breast, no LAD. No nipple discharge, skin changes  Chest Wall: Asymmetry of the right chest wall along rib that is not present on left and is around 8 mm.     Chaperone was present during exam    ASSESSMENT/PLAN:   Chest wall asymmetry Area of asymmetry along right chest wall when compared to the left. Patient likely has anatomical difference in rib structure in right versus left that is causing this elevated area along chest wall. Breast exam is not concerning without LAD and last mammogram was March 2022, was benign. She is not experiencing any skin changes or bleeding/discharge from the breast. Pt was instructed to monitor the area for any change in size, skin changes, or onset of pain. We will follow up in 2 months to re-evaluate to see if there is any change.    Erskine Emery, MD [place]

## 2021-04-27 NOTE — Assessment & Plan Note (Signed)
Area of asymmetry along right chest wall when compared to the left. Patient likely has anatomical difference in rib structure in right versus left that is causing this elevated area along chest wall. Breast exam is not concerning without LAD and last mammogram was March 2022, was benign. She is not experiencing any skin changes or bleeding/discharge from the breast. Pt was instructed to monitor the area for any change in size, skin changes, or onset of pain. We will follow up in 2 months to re-evaluate to see if there is any change.

## 2021-05-07 ENCOUNTER — Telehealth: Payer: Self-pay | Admitting: Gastroenterology

## 2021-05-07 NOTE — Telephone Encounter (Signed)
Patient called stating that the last 3 days her anus has been itching, she states that she has not changed soaps or anything. Seeking advice. Please advise.

## 2021-05-07 NOTE — Telephone Encounter (Signed)
The pt has a history of diarrhea and over the past 3 days she began to have anal itching.  No history of hemorrhoids and she does not believe that it is hemorrhoid related.  She was advised to keep the area clean and dry, pat do not rub, and use Desitin or other "diaper rash" ointment to the area several times daily.  She will call back in 1 week if itching has not subsided.

## 2021-05-07 NOTE — Telephone Encounter (Signed)
The pt wanted to know what ointment she was advised to use on her rectum.  She was told vaseline, desitin, Calmoseptine, etc.  She will try for 1 week and call back if the itching does not subside.

## 2021-05-07 NOTE — Telephone Encounter (Signed)
Patient called back and has additional questions.  Please give patient a call back.  Thank you.

## 2021-05-11 NOTE — Telephone Encounter (Signed)
The pt states that the rectal itching has resolved however she now has a "loud" odor.  She says she does have some discharge coming from the rectum but she would not elaborate.  She just said "I always have discharge".  She tells me that she uses cottonelle wipes but has been doing so for years.  She is patting the area and not rubbing and keeping it dry.  She was last seen in Nov this year.  Please advise any further recommendations.

## 2021-05-11 NOTE — Telephone Encounter (Signed)
Patient called this morning stating that she has been using the Desitin and Vasesline and that the itching seems to be better; however, now there is a "smell" that she can't seem to get rid of.  Can you please call and advise?  Thank you.

## 2021-05-12 NOTE — Telephone Encounter (Signed)
The pt has been advised to continue the recommendations previously discussed and keep her upcoming appt for colon the end of December.  The pt has been advised of the information and verbalized understanding.

## 2021-05-12 NOTE — Telephone Encounter (Signed)
Was expecting a call back yesterday, she tells me that she is unsure if she has hemorrhoids because her stools are still very soft. Would like to speak to nurse please.

## 2021-05-13 ENCOUNTER — Ambulatory Visit: Payer: Medicare Other | Admitting: Family Medicine

## 2021-05-20 ENCOUNTER — Other Ambulatory Visit: Payer: Self-pay

## 2021-05-20 MED ORDER — TRULICITY 0.75 MG/0.5ML ~~LOC~~ SOAJ: 0.7500 mg | SUBCUTANEOUS | 1 refills | Status: DC

## 2021-05-28 ENCOUNTER — Encounter: Payer: Self-pay | Admitting: Certified Registered Nurse Anesthetist

## 2021-05-29 ENCOUNTER — Encounter: Payer: Self-pay | Admitting: Gastroenterology

## 2021-05-29 ENCOUNTER — Ambulatory Visit (AMBULATORY_SURGERY_CENTER): Payer: Medicare Other | Admitting: Gastroenterology

## 2021-05-29 VITALS — BP 115/72 | HR 82 | Temp 97.8°F | Resp 15 | Ht 63.0 in | Wt 142.0 lb

## 2021-05-29 DIAGNOSIS — D122 Benign neoplasm of ascending colon: Secondary | ICD-10-CM

## 2021-05-29 DIAGNOSIS — K52831 Collagenous colitis: Secondary | ICD-10-CM

## 2021-05-29 DIAGNOSIS — K635 Polyp of colon: Secondary | ICD-10-CM | POA: Diagnosis not present

## 2021-05-29 DIAGNOSIS — R197 Diarrhea, unspecified: Secondary | ICD-10-CM

## 2021-05-29 MED ORDER — SODIUM CHLORIDE 0.9 % IV SOLN: 500.0000 mL | Freq: Once | INTRAVENOUS | Status: DC

## 2021-05-29 NOTE — Op Note (Signed)
[facility] Patient Name: Kimberly Browning Procedure Date: 05/29/2021 8:30 AM MRN: [id number] Endoscopist: Milus Banister , MD Age: 45 Referring MD:  Date of Birth: 22-Dec-1975 Gender: Female Account #: [id number] Procedure:                Colonoscopy Indications:              Chronic diarrhea Medicines:                Monitored Anesthesia Care Procedure:                Pre-Anesthesia Assessment:                           - Prior to the procedure, a History and Physical                            was performed, and patient medications and                            allergies were reviewed. The patient's tolerance of                            previous anesthesia was also reviewed. The risks                            and benefits of the procedure and the sedation                            options and risks were discussed with the patient.                            All questions were answered, and informed consent                            was obtained. Prior Anticoagulants: The patient has                            taken no previous anticoagulant or antiplatelet                            agents. ASA Grade Assessment: II - A patient with                            mild systemic disease. After reviewing the risks                            and benefits, the patient was deemed in                            satisfactory condition to undergo the procedure.                           After obtaining informed consent, the colonoscope  was passed under direct vision. Throughout the                            procedure, the patient's blood pressure, pulse, and                            oxygen saturations were monitored continuously. The                            Olympus CF-HQ190L [id number]) Colonoscope was                            introduced through the anus and advanced to the the                            terminal ileum. The colonoscopy was performed                             without difficulty. The patient tolerated the                            procedure well. The quality of the bowel                            preparation was good. The terminal ileum, ileocecal                            valve, appendiceal orifice, and rectum were                            photographed. Scope In: 8:33:32 AM Scope Out: 8:47:05 AM Scope Withdrawal Time: 0 hours 8 minutes 49 seconds  Total Procedure Duration: 0 hours 13 minutes 33 seconds  Findings:                 The terminal ileum appeared normal.                           A 2 mm polyp was found in the ascending colon. The                            polyp was sessile. The polyp was removed with a                            cold snare. Resection and retrieval were complete.                           Biopsies for histology were taken with a cold                            forceps from the entire colon for evaluation of                            microscopic colitis.  The exam was otherwise without abnormality on                            direct and retroflexion views. Complications:            No immediate complications. Estimated blood loss:                            None. Estimated Blood Loss:     Estimated blood loss: none. Impression:               - The examined portion of the ileum was normal.                           - One 2 mm polyp in the ascending colon, removed                            with a cold snare. Resected and retrieved.                           - The examination was otherwise normal on direct                            and retroflexion views.                           - Biopsies were taken with a cold forceps from the                            entire colon for evaluation of microscopic colitis. Recommendation:           - Patient has a contact number available for                            emergencies. The signs and symptoms of potential                             delayed complications were discussed with the                            patient. Return to normal activities tomorrow.                            Written discharge instructions were provided to the                            patient.                           - Resume previous diet.                           - Continue present medications.                           - Await pathology results. Milus Banister, MD 05/29/2021  8:53:30 AM This report has been signed electronically.

## 2021-05-29 NOTE — Progress Notes (Signed)
VS-CW  Pt's states no medical or surgical changes since previsit or office visit.  

## 2021-05-29 NOTE — Progress Notes (Signed)
Called to room to assist during endoscopic procedure.  Patient ID and intended procedure confirmed with present staff. Received instructions for my participation in the procedure from the performing physician.  

## 2021-05-29 NOTE — Patient Instructions (Signed)
Await pathology results from the biopsies taken today. ? ?Resume previous diet and medications. ? ? ?YOU HAD AN ENDOSCOPIC PROCEDURE TODAY AT THE [facility]:   Refer to the procedure report that was given to you for any specific questions about what was found during the examination.  If the procedure report does not answer your questions, please call your gastroenterologist to clarify.  If you requested that your care partner not be given the details of your procedure findings, then the procedure report has been included in a sealed envelope for you to review at your convenience later. ? ?YOU SHOULD EXPECT: Some feelings of bloating in the abdomen. Passage of more gas than usual.  Walking can help get rid of the air that was put into your GI tract during the procedure and reduce the bloating. If you had a lower endoscopy (such as a colonoscopy or flexible sigmoidoscopy) you may notice spotting of blood in your stool or on the toilet paper. If you underwent a bowel prep for your procedure, you may not have a normal bowel movement for a few days. ? ?Please Note:  You might notice some irritation and congestion in your nose or some drainage.  This is from the oxygen used during your procedure.  There is no need for concern and it should clear up in a day or so. ? ?SYMPTOMS TO REPORT IMMEDIATELY: ? ?Following lower endoscopy (colonoscopy or flexible sigmoidoscopy): ? Excessive amounts of blood in the stool ? Significant tenderness or worsening of abdominal pains ? Swelling of the abdomen that is new, acute ? Fever of 100?F or higher ? ?For urgent or emergent issues, a gastroenterologist can be reached at any hour by calling [phone]. ?Do not use MyChart messaging for urgent concerns.  ? ? ?DIET:  We do recommend a small meal at first, but then you may proceed to your regular diet.  Drink plenty of fluids but you should avoid alcoholic beverages for 24 hours. ? ?ACTIVITY:  You should plan to take it  easy for the rest of today and you should NOT DRIVE or use heavy machinery until tomorrow (because of the sedation medicines used during the test).   ? ?FOLLOW UP: ?Our staff will call the number listed on your records 48-72 hours following your procedure to check on you and address any questions or concerns that you may have regarding the information given to you following your procedure. If we do not reach you, we will leave a message.  We will attempt to reach you two times.  During this call, we will ask if you have developed any symptoms of COVID 19. If you develop any symptoms (ie: fever, flu-like symptoms, shortness of breath, cough etc.) before then, please call [phone].  If you test positive for Covid 19 in the 2 weeks post procedure, please call and report this information to us.   ? ?If any biopsies were taken you will be contacted by phone or by letter within the next 1-3 weeks.  Please call us at [phone] if you have not heard about the biopsies in 3 weeks.  ? ? ?SIGNATURES/CONFIDENTIALITY: ?You and/or your care partner have signed paperwork which will be entered into your electronic medical record.  These signatures attest to the fact that that the information above on your After Visit Summary has been reviewed and is understood.  Full responsibility of the confidentiality of this discharge information lies with you and/or your care-partner.  ?

## 2021-05-29 NOTE — Progress Notes (Signed)
HPI: This is a woman with chronic diarrhrea   ROS: complete GI ROS as described in HPI, all other review negative.  Constitutional:  No unintentional weight loss   Past Medical History:  Diagnosis Date   Anxiety    Bipolar disorder ([place])    Depression    Dry skin    hands   Feeling of incomplete bladder emptying    GERD (gastroesophageal reflux disease)    Hypercholesteremia    Nocturia    Sleep apnea    Urge urinary incontinence    Vitamin D deficiency    Wears contact lenses     Past Surgical History:  Procedure Laterality Date   BREAST REDUCTION SURGERY  05/10/2011   Procedure: MAMMARY REDUCTION BILATERAL (BREAST);  Surgeon: Hermelinda Dellen;  Location: [facility];  Service: Plastics;  Laterality: Bilateral;  bilateral breast reduction   DILATION AND EVACUATION  12/26/2008   x 2   INTERSTIM IMPLANT PLACEMENT N/A 07/09/2014   Procedure: Barrie Lyme IMPLANT FIRST STAGE;  Surgeon: Reece Packer, MD;  Location: [facility];  Service: Urology;  Laterality: N/A;   INTERSTIM IMPLANT PLACEMENT N/A 07/09/2014   Procedure: Barrie Lyme IMPLANT SECOND STAGE;  Surgeon: Reece Packer, MD;  Location: [facility];  Service: Urology;  Laterality: N/A;   INTRAUTERINE DEVICE (IUD) INSERTION  sept 2011   mirena   WISDOM TOOTH EXTRACTION  as a teenager    Current Outpatient Medications  Medication Sig Dispense Refill   amantadine (SYMMETREL) 100 MG capsule Take 100 mg by mouth daily.     AMBULATORY NON FORMULARY MEDICATION Medication Name: GI Cocktail   270 mL Viscous Xylocaine 2%, 270 mL Dicyclomine 10 mg/5 mL, 810 mL Mylanta    Sig: Take 30 mL by mouth twice daily as needed for burning sensation. 1 Bottle 1   BELSOMRA 20 MG TABS Take 1 tablet by mouth at bedtime.     diphenoxylate-atropine (LOMOTIL) 2.5-0.025 MG tablet Take 1 tablet by mouth every 6 (six) hours as needed for diarrhea or loose stools. 60 tablet 1   Dulaglutide (TRULICITY)  7.62 GB/1.5VV SOPN Inject 0.75 mg into the skin once a week. 0.5 mL 1   Insulin Pen Needle (BD PEN NEEDLE NANO U/F) 32G X 4 MM MISC 1 application by Does not apply route daily. 200 each 2   lamoTRIgine (LAMICTAL) 200 MG tablet Take 300 mg by mouth daily.     metoCLOPramide (REGLAN) 10 MG tablet TAKE 1 TABLET BY MOUTH EVERY 6 HOURS AS NEEDED FOR NAUSEA FOR VOMITING 10 tablet 0   mirtazapine (REMERON) 30 MG tablet Take 30 mg by mouth at bedtime.     temazepam (RESTORIL) 15 MG capsule Take 3 capsules by mouth daily.     dexlansoprazole (DEXILANT) 60 MG capsule Take 1 capsule (60 mg total) by mouth daily before breakfast. (Patient not taking: Reported on 05/29/2021) 30 capsule 4   famotidine (PEPCID) 20 MG tablet Take 1 tablet (20 mg total) by mouth in the morning AND 2 tablets (40 mg total) at bedtime. (Patient not taking: Reported on 05/29/2021) 90 tablet 1   Norgestimate-Ethinyl Estradiol Triphasic (ORTHO TRI-CYCLEN LO) 0.18/0.215/0.25 MG-25 MCG tab Take 1 tablet by mouth daily. (Patient not taking: Reported on 04/27/2021) 28 tablet 3   Current Facility-Administered Medications  Medication Dose Route Frequency Provider Last Rate Last Admin   0.9 %  sodium chloride infusion  500 mL Intravenous Once Milus Banister, MD  Allergies as of 05/29/2021 - Review Complete 05/29/2021  Allergen Reaction Noted   Geodon [ziprasidone hydrochloride] Shortness Of Breath and Swelling 05/03/2011   Hibiclens [chlorhexidine] Itching 07/09/2014   Sulfamethoxazole-trimethoprim Other (See Comments) 11/15/2013   Doxycycline Other (See Comments) 12/27/2017   Gabapentin Other (See Comments) 12/27/2017   Other Other (See Comments) 07/14/2015   Penicillins Other (See Comments) 12/27/2017   Promethazine Other (See Comments) 12/27/2017   Depakote [divalproex sodium] Rash 07/04/2014    Family History  Adopted: Yes    Social History   Socioeconomic History   Marital status: Divorced    Spouse name: Not on  file   Number of children: 1   Years of education: Not on file   Highest education level: Not on file  Occupational History   Occupation: disablitiy  Tobacco Use   Smoking status: Former    Packs/day: 0.25    Years: 19.00    Pack years: 4.75    Types: Cigarettes    Quit date: 09/23/2019    Years since quitting: 1.6   Smokeless tobacco: Never   Tobacco comments:     1 pp3d  Vaping Use   Vaping Use: Never used  Substance and Sexual Activity   Alcohol use: Not Currently   Drug use: No   Sexual activity: Yes    Birth control/protection: None  Other Topics Concern   Not on file  Social History Narrative   Not on file   Social Determinants of Health   Financial Resource Strain: Not on file  Food Insecurity: Not on file  Transportation Needs: Not on file  Physical Activity: Not on file  Stress: Not on file  Social Connections: Not on file  Intimate Partner Violence: Not on file     Physical Exam: BP 117/65    Pulse (!) 108    Temp 97.8 F (36.6 C)    Ht 5\' 3"  (1.6 m)    Wt 142 lb (64.4 kg)    SpO2 98%    BMI 25.15 kg/m  Constitutional: generally well-appearing Psychiatric: alert and oriented x3 Lungs: CTA bilaterally Heart: no MCR  Assessment and plan: 45 y.o. female with chronic diarrhea  Colonoscopy today  Care is appropriate for the ambulatory setting.  Owens Loffler, MD [facility] 05/29/2021, 8:25 AM

## 2021-05-29 NOTE — Progress Notes (Signed)
Report given to PACU, vss 

## 2021-06-02 ENCOUNTER — Telehealth: Payer: Self-pay | Admitting: Gastroenterology

## 2021-06-02 NOTE — Telephone Encounter (Signed)
Patient requesting results from procedure on 12/23 , Please advise

## 2021-06-02 NOTE — Telephone Encounter (Signed)
The pt has been advised that results are not yet available.  I have advised her that we will contact her as soon as resulted.  The pt has been advised of the information and verbalized understanding.

## 2021-06-03 ENCOUNTER — Telehealth: Payer: Self-pay

## 2021-06-03 ENCOUNTER — Ambulatory Visit: Payer: Medicare Other

## 2021-06-03 NOTE — Telephone Encounter (Signed)
°  Follow up Call-  Call back number 05/29/2021 10/06/2020  Post procedure Call Back phone  # [phone]  Permission to leave phone message Yes Yes  Some recent data might be hidden     Patient questions:  Do you have a fever, pain , or abdominal swelling? No. Pain Score  0 *  Have you tolerated food without any problems? Yes.    Have you been able to return to your normal activities? Yes.    Do you have any questions about your discharge instructions: Diet   No. Medications  No. Follow up visit  No.  Do you have questions or concerns about your Care? No.  Actions: * If pain score is 4 or above: No action needed, pain <4.

## 2021-06-09 NOTE — Telephone Encounter (Signed)
The biopsies show that she has collagenous colitis. Recommend daily AM imodium (2 pills on a scheduled, daily basis) and OV on 07/21/21 at 3:20 pm

## 2021-06-09 NOTE — Telephone Encounter (Signed)
The pt has been advised of the path results.  She was advised to take 2 imodium every morning.  She states that they do not work.  I did tell her to take them every day when waking and keep the appt scheduled to see Dr Ardis Hughs to discuss further treatment options. The pt has been advised of the information and verbalized understanding.

## 2021-06-09 NOTE — Telephone Encounter (Signed)
Inbound call from patient following up about results. Would like a call back

## 2021-06-23 ENCOUNTER — Telehealth: Payer: Self-pay | Admitting: Gastroenterology

## 2021-06-23 NOTE — Telephone Encounter (Signed)
Patient called stating that she has a rash and that she has tried creams and its not disappearing. Seeking advice, please advise.

## 2021-06-23 NOTE — Telephone Encounter (Signed)
Line busy

## 2021-06-24 NOTE — Telephone Encounter (Signed)
The pt states she was using Vaseline on the rash from diarrhea and that cleared the rash up very well.  She stopped using the Vaseline and the rash returned.  I have advised her to resume the Vaseline and keep her appt with Dr Ardis Hughs in February.  The pt has been advised of the information and verbalized understanding.

## 2021-06-24 NOTE — Telephone Encounter (Signed)
Second attempt to reach pt, left vm for pt to call back.

## 2021-07-06 ENCOUNTER — Telehealth: Payer: Self-pay | Admitting: Gastroenterology

## 2021-07-06 NOTE — Telephone Encounter (Signed)
Pt called in complaining of abdominal pain when she uses the bathroom. Stated she has chronic loose stools. Pt has not been using imodium as advised previously, stating that it doesn't work for her & is too expensive. Pt was educated that she should try to be consistent with the imodium, and if not then she could take Lomotil. Pt verbalized understanding, no further questions. She has a follow up scheduled for 07/14/21 with DJ.   Just an fyi.

## 2021-07-06 NOTE — Telephone Encounter (Signed)
Patient called and stated that she is having abd pain that is very sharp. Patient has an appointment with Dr. Ardis Hughs on 2/7. Seeking advice, please advise.

## 2021-07-13 ENCOUNTER — Telehealth: Payer: Self-pay

## 2021-07-13 NOTE — Telephone Encounter (Signed)
Prior Authorization for patient's Dexilant has been approved. Authorization is good until 05/27/2022

## 2021-07-14 ENCOUNTER — Encounter: Payer: Self-pay | Admitting: Gastroenterology

## 2021-07-14 ENCOUNTER — Other Ambulatory Visit: Payer: Self-pay | Admitting: Family Medicine

## 2021-07-14 ENCOUNTER — Ambulatory Visit (INDEPENDENT_AMBULATORY_CARE_PROVIDER_SITE_OTHER): Payer: Medicare Other | Admitting: Gastroenterology

## 2021-07-14 ENCOUNTER — Other Ambulatory Visit: Payer: Self-pay

## 2021-07-14 VITALS — BP 110/66 | HR 64 | Ht 63.0 in | Wt 133.5 lb

## 2021-07-14 DIAGNOSIS — K52831 Collagenous colitis: Secondary | ICD-10-CM | POA: Diagnosis not present

## 2021-07-14 MED ORDER — BUDESONIDE 3 MG PO CPEP
9.0000 mg | ORAL_CAPSULE | Freq: Every day | ORAL | 6 refills | Status: DC
Start: 2021-07-14 — End: 2021-07-20

## 2021-07-14 NOTE — Patient Instructions (Addendum)
If you are age 45 or younger, your body mass index should be between 19-25. Your Body mass index is 23.65 kg/m. If this is out of the aformentioned range listed, please consider follow up with your Primary Care Provider.  _______________________________________________________  The [facility] providers would like to encourage you to use [facility] to communicate with providers for non-urgent requests or questions.  Due to long hold times on the telephone, sending your provider a message by [facility] may be a faster and more efficient way to get a response.  Please allow 48 business hours for a response.  Please remember that this is for non-urgent requests.  _______________________________________________________  We have sent the following medications to your pharmacy for you to pick up at your convenience:  START: budesonide 3mg  take 3 capsules daily.  You will need to schedule a follow up in April 2023.  Please call to schedule this appointment.  Thank you for entrusting me with your care and choosing [facility].  Dr Ardis Hughs

## 2021-07-14 NOTE — Telephone Encounter (Signed)
Patient returns call to nurse line regarding rx refill. Patient states that she is needing refill as soon as possible as she is out. Advised patient of rx refill policy.   Will forward to PCP.   Talbot Grumbling, RN

## 2021-07-14 NOTE — Progress Notes (Signed)
Review of pertinent gastrointestinal problems: 1.  Collagenous colitis.  Colonoscopy December 2022 Dr. Ardis Hughs for chronic diarrhea found a normal terminal ileum, 2 mm polyp was removed, the colon was randomly biopsied.  Polyp was not precancerous, the biopsies showed "microscopic colitis, collagenous colitis" 2.  GERD, EGD 10/2020 showed mild nonspecific gastritis.  Biopsies of stomach showed no H. pylori.  Biopsies of her esophagus showed reflux changes, no eosinophilic changes.  Her symptoms were controlled on proton pump inhibitor once daily.   HPI: This is a pleasant 46 year old woman whom I last saw the time of colonoscopy December 2022  She has lost 9 pounds since her last office visit here November 2022  She tried taking Imodium for her collagenous colitis and it did not work.  I am not sure how much she was taking but in the end she said she just could not afford it anyway.  She was laughing at strange times during the visit today  She is having multiple watery nonbloody stools daily with some urgency.  She says it is ruining her life.  Worse after she eats  She is having no abdominal pains.  ROS: complete GI ROS as described in HPI, all other review negative.  Constitutional:  No unintentional weight loss   Past Medical History:  Diagnosis Date   Anxiety    Bipolar disorder ([place])    Depression    Dry skin    hands   Feeling of incomplete bladder emptying    GERD (gastroesophageal reflux disease)    Hypercholesteremia    Nocturia    Sleep apnea    Urge urinary incontinence    Vitamin D deficiency    Wears contact lenses     Past Surgical History:  Procedure Laterality Date   BREAST REDUCTION SURGERY  05/10/2011   Procedure: MAMMARY REDUCTION BILATERAL (BREAST);  Surgeon: Hermelinda Dellen;  Location: [facility];  Service: Plastics;  Laterality: Bilateral;  bilateral breast reduction   DILATION AND EVACUATION  12/26/2008   x 2   INTERSTIM IMPLANT  PLACEMENT N/A 07/09/2014   Procedure: Barrie Lyme IMPLANT FIRST STAGE;  Surgeon: Reece Packer, MD;  Location: [facility];  Service: Urology;  Laterality: N/A;   INTERSTIM IMPLANT PLACEMENT N/A 07/09/2014   Procedure: Barrie Lyme IMPLANT SECOND STAGE;  Surgeon: Reece Packer, MD;  Location: [place];  Service: Urology;  Laterality: N/A;   INTRAUTERINE DEVICE (IUD) INSERTION  sept 2011   mirena   WISDOM TOOTH EXTRACTION  as a teenager    Current Outpatient Medications  Medication Sig Dispense Refill   amantadine (SYMMETREL) 100 MG capsule Take 100 mg by mouth daily.     BELSOMRA 20 MG TABS Take 1 tablet by mouth at bedtime.     dexlansoprazole (DEXILANT) 60 MG capsule Take 1 capsule (60 mg total) by mouth daily before breakfast. 30 capsule 4   diphenoxylate-atropine (LOMOTIL) 2.5-0.025 MG tablet Take 1 tablet by mouth every 6 (six) hours as needed for diarrhea or loose stools. 60 tablet 1   Dulaglutide (TRULICITY) 9.38 BO/1.7PZ SOPN Inject 0.75 mg into the skin once a week. 0.5 mL 1   Insulin Pen Needle (BD PEN NEEDLE NANO U/F) 32G X 4 MM MISC 1 application by Does not apply route daily. 200 each 2   lamoTRIgine (LAMICTAL) 200 MG tablet Take 400 mg by mouth daily.     metoCLOPramide (REGLAN) 10 MG tablet TAKE 1 TABLET BY MOUTH EVERY 6 HOURS AS NEEDED  FOR NAUSEA FOR VOMITING 10 tablet 0   mirtazapine (REMERON) 30 MG tablet Take 30 mg by mouth at bedtime.     Norgestimate-Ethinyl Estradiol Triphasic (ORTHO TRI-CYCLEN LO) 0.18/0.215/0.25 MG-25 MCG tab Take 1 tablet by mouth daily. 28 tablet 3   temazepam (RESTORIL) 15 MG capsule Take 3 capsules by mouth daily.     No current facility-administered medications for this visit.    Allergies as of 07/14/2021 - Review Complete 07/14/2021  Allergen Reaction Noted   Geodon [ziprasidone hydrochloride] Shortness Of Breath and Swelling 05/03/2011   Hibiclens [chlorhexidine] Itching 07/09/2014    Sulfamethoxazole-trimethoprim Other (See Comments) 11/15/2013   Doxycycline Other (See Comments) 12/27/2017   Gabapentin Other (See Comments) 12/27/2017   Other Other (See Comments) 07/14/2015   Penicillins Other (See Comments) 12/27/2017   Promethazine Other (See Comments) 12/27/2017   Depakote [divalproex sodium] Rash 07/04/2014    Family History  Adopted: Yes    Social History   Socioeconomic History   Marital status: Divorced    Spouse name: Not on file   Number of children: 1   Years of education: Not on file   Highest education level: Not on file  Occupational History   Occupation: disablitiy  Tobacco Use   Smoking status: Former    Packs/day: 0.25    Years: 19.00    Pack years: 4.75    Types: Cigarettes    Quit date: 09/23/2019    Years since quitting: 1.8   Smokeless tobacco: Never   Tobacco comments:     1 pp3d  Vaping Use   Vaping Use: Never used  Substance and Sexual Activity   Alcohol use: Not Currently   Drug use: No   Sexual activity: Yes    Birth control/protection: None  Other Topics Concern   Not on file  Social History Narrative   Not on file   Social Determinants of Health   Financial Resource Strain: Not on file  Food Insecurity: Not on file  Transportation Needs: Not on file  Physical Activity: Not on file  Stress: Not on file  Social Connections: Not on file  Intimate Partner Violence: Not on file     Physical Exam: BP 110/66    Pulse 64    Ht 5\' 3"  (1.6 m)    Wt 133 lb 8 oz (60.6 kg)    BMI 23.65 kg/m  Constitutional: generally well-appearing Psychiatric: alert and oriented x3 Abdomen: soft, nontender, nondistended, no obvious ascites, no peritoneal signs, normal bowel sounds No peripheral edema noted in lower extremities  Assessment and plan: 46 y.o. female with chronic diarrhea from collagenous colitis  Imodium did not seem to work very well and she said she cannot afford it anyway.  I am going to call in a prescription  medicine budesonide 3 mg pills she will take 3 pills once daily until she returns to see me here in the office in 2 months.  I explained to her that this is usually very effective at treating microscopic colitis.  If she does well on 3 pills once daily after 2 months I will probably try to back her down by 1 pill every few weeks  Please see the "Patient Instructions" section for addition details about the plan.  Owens Loffler, MD [facility] 07/14/2021, 3:36 PM   Total time on date of encounter was 30 minutes (this included time spent preparing to see the patient reviewing records; obtaining and/or reviewing separately obtained history; performing a medically appropriate exam and/or evaluation;  counseling and educating the patient and family if present; ordering medications, tests or procedures if applicable; and documenting clinical information in the health record).

## 2021-07-15 ENCOUNTER — Telehealth: Payer: Self-pay | Admitting: Gastroenterology

## 2021-07-15 NOTE — Telephone Encounter (Signed)
Patient called states her insurance will not cover Budesonide and she is seeking further advise on what to do.

## 2021-07-15 NOTE — Telephone Encounter (Signed)
Patient aware that prior auth for budesonide has been submitted and we are waiting to hear back from CoverMyMeds.

## 2021-07-16 NOTE — Telephone Encounter (Signed)
Patient notified that Budesonide 3mg  capsules has been approved until 06-06-22.

## 2021-07-20 MED ORDER — BUDESONIDE 3 MG PO CPEP: 6.0000 mg | ORAL_CAPSULE | Freq: Every day | ORAL | 6 refills | Status: DC

## 2021-07-20 NOTE — Telephone Encounter (Signed)
Patient called again to "add to" her list of issues.  She said she has not slept in 3 days and is having a lot of discharge.

## 2021-07-20 NOTE — Telephone Encounter (Signed)
Patient advised to reduce budesonide from 3 pills per day to 2 pills per day and to call back in 1 or 2 weeks to report on how she is doing on that dose. Advised her to contact her gynecologist about her vaginal discharge.  Patient agreed to plan and verbalized understanding.  No further questions.

## 2021-07-20 NOTE — Telephone Encounter (Signed)
Patient called this morning states she is having a reaction to the budesonide medication. Said she has not be able to sleep since she started it and also has chills.

## 2021-07-20 NOTE — Addendum Note (Signed)
Addended by: Stevan Born on: 07/20/2021 03:46 PM   Modules accepted: Orders

## 2021-07-20 NOTE — Telephone Encounter (Signed)
Patient called to the office with complaints of no sleep in 3 days since starting budesonide.  She started budesonide on 07-16-21 and has complaints of chills and "heavy gas."  She stated "the medication has helped with diarrhea symptoms, but she continues to have vaginal discharge."

## 2021-07-21 ENCOUNTER — Ambulatory Visit: Payer: Medicare Other | Admitting: Gastroenterology

## 2021-07-22 NOTE — Patient Instructions (Signed)
It was wonderful to meet you today. Thank you for allowing me to be a part of your care. Below is a short summary of what we discussed at your visit today:  Vaginal Odor Today we looked at your vaginal sample under the microscope and did not see any evidence of infection.  You do not need any medication at this time.  I recommend continue with good vaginal hygiene.  What you are experiencing is likely normal physiologic discharge.  If this continues, gets worse, or changes in any way please make an appointment to come back.  Cooking and Nutrition Classes [employer] in [place] provides many classes at low or no cost to [employer], nutrition, and agriculture.  Their website offers a huge variety of information related to topics such as gardening, nutrition, cooking, parenting, and health.  Also listed are classes and events, both online and in-person.  Check out their website here: [email or web address]     Please bring all of your medications to every appointment!  If you have any questions or concerns, please do not hesitate to contact us via phone or MyChart message.   Ezequiel Essex, MD

## 2021-07-23 ENCOUNTER — Ambulatory Visit (INDEPENDENT_AMBULATORY_CARE_PROVIDER_SITE_OTHER): Payer: Medicare Other | Admitting: Family Medicine

## 2021-07-23 ENCOUNTER — Telehealth: Payer: Self-pay | Admitting: Gastroenterology

## 2021-07-23 ENCOUNTER — Encounter: Payer: Self-pay | Admitting: Family Medicine

## 2021-07-23 ENCOUNTER — Telehealth: Payer: Self-pay

## 2021-07-23 ENCOUNTER — Other Ambulatory Visit: Payer: Self-pay

## 2021-07-23 VITALS — BP 122/82 | HR 86 | Ht 63.0 in | Wt 133.0 lb

## 2021-07-23 DIAGNOSIS — N898 Other specified noninflammatory disorders of vagina: Secondary | ICD-10-CM

## 2021-07-23 DIAGNOSIS — Z113 Encounter for screening for infections with a predominantly sexual mode of transmission: Secondary | ICD-10-CM

## 2021-07-23 DIAGNOSIS — Z114 Encounter for screening for human immunodeficiency virus [HIV]: Secondary | ICD-10-CM

## 2021-07-23 DIAGNOSIS — Z1159 Encounter for screening for other viral diseases: Secondary | ICD-10-CM

## 2021-07-23 LAB — POCT WET PREP (WET MOUNT)
Clue Cells Wet Prep Whiff POC: NEGATIVE
Trichomonas Wet Prep HPF POC: ABSENT

## 2021-07-23 NOTE — Progress Notes (Signed)
° ° °  SUBJECTIVE:   CHIEF COMPLAINT / HPI:   Vaginal discharge and odor - Increased discharge and abnormal odor - Describes copious discharge, cannot quite characterize - Unknown duration, patient cannot say - No pain, discomfort, pruritus, rashes, lesions - No recent antibiotic use to precipitate candidal infection - No known recent STI exposures   PERTINENT  PMH / PSH:  Patient Active Problem List   Diagnosis Date Noted   Chest wall asymmetry 04/27/2021   General counselling and advice on contraception 04/07/2021   Diarrhea 02/25/2021   Overweight (BMI 25.0-29.9) 12/01/2020   Globus sensation 12/01/2020   Dysphagia 11/25/2020   Vaginal discharge 11/20/2020   Abdominal wall pain in flank 08/27/2020   Symptomatic mammary hypertrophy 08/15/2020   Elevated serum creatinine 07/17/2020   IUD contraception 03/18/2020   Onychomycosis of left great toe 10/24/2019   Obesity (BMI 30.0-34.9) 08/24/2019   Depression with anxiety    Gastroesophageal reflux disease without esophagitis 01/29/2016   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   History of tobacco use 06/09/2009   History of molar pregnancy, antepartum 12/12/2008   INSOMNIA 12/11/2008   HERPES LABIALIS 10/10/2007   Hyperlipidemia 01/03/2007   Urinary incontinence, mixed 11/25/2006   BIPOLAR DISORDER 08/04/2006    OBJECTIVE:   BP 122/82    Pulse 86    Ht 5\' 3"  (1.6 m)    Wt 133 lb (60.3 kg)    LMP 07/31/2020    SpO2 100%    BMI 23.56 kg/m   Physical Exam General: Awake, alert, oriented, no acute distress, pressured speech, tangential thought Respiratory: Normal work of breathing, no respiratory distress Neuro: Cranial nerves II through X grossly intact, able to move all extremities spontaneously Vulva: Normal appearing vulva without rashes, lesions, or deformities Vagina: Pale pink rugated vaginal tissue without obvious lesions, thin physiologic discharge of whitish color, cervix without lesion or overt tenderness with  swab  ASSESSMENT/PLAN:   Vaginal discharge Acute, unknown duration.  Physical exam unremarkable, wet prep negative for BV, yeast, trichomonas.  Patient declines STI testing at this time.  Vaginal discharge likely physiologic in nature, no indication for medications at this time.  Discussed routine vaginal and vulvar hygiene, including avoidance of intravaginal OTC treatments and douching.  Return precautions given, see AVS for more.    Ezequiel Essex, MD [place]

## 2021-07-23 NOTE — Telephone Encounter (Signed)
ERROR

## 2021-07-23 NOTE — Telephone Encounter (Signed)
Patient calls nurse line regarding concerns with vaginal discharge. Patient has additional questions for provider regarding how to get rid of the discharge.   Patient would like to discuss further with provider she saw today (Dr. Jeani Hawking)  Please return call to patient at [phone].  Talbot Grumbling, RN

## 2021-07-24 NOTE — Assessment & Plan Note (Signed)
Acute, unknown duration.  Physical exam unremarkable, wet prep negative for BV, yeast, trichomonas.  Patient declines STI testing at this time.  Vaginal discharge likely physiologic in nature, no indication for medications at this time.  Discussed routine vaginal and vulvar hygiene, including avoidance of intravaginal OTC treatments and douching.  Return precautions given, see AVS for more.

## 2021-07-25 NOTE — Telephone Encounter (Signed)
Return patient call as requested. No answer, left HIPAA safe VM. Please refer to the AVS from the last visit and other printed information on routine vaginal hygiene measures that may help to mitigate perceived vaginal odor.   Ezequiel Essex, MD

## 2021-07-27 NOTE — Telephone Encounter (Signed)
Patient called to office with complaints of "issues with sleep."  She stated she is waking up around 1 am and is having trouble falling back to sleep.  She contributes this to Budesonide.  She stated that she is no longer having diarrhea and is only taking budesonide once daily.     Patient would like to discontinue budesonide for 3 to 4 days to see if this will improve her sleep pattern.  Patient advised that I will make Dr Ardis Hughs aware of decision to discontinue budesonide and she was advised to contact our office with any further complications of diarrhea.  Patient agreed to plan and verbalized understanding.  No further questions.

## 2021-07-27 NOTE — Addendum Note (Signed)
Addended by: Stevan Born on: 07/27/2021 04:25 PM   Modules accepted: Orders

## 2021-07-27 NOTE — Telephone Encounter (Signed)
Patient called states she is still not able to sleep with the Budesonide medication.

## 2021-07-28 NOTE — Telephone Encounter (Signed)
Patient called frantic about her vaginal odor.  She states that it hasn't gone away and "is very loud".  Patient states that she followed suggestions by the doctor but nothing is working.  She would like to be referred to a gynecologist for this concern.  Will forward to MD.  Kimberly Browning

## 2021-07-28 NOTE — Telephone Encounter (Signed)
Patient scheduled for 3/3.

## 2021-07-28 NOTE — Telephone Encounter (Signed)
Called patient. Discussed several options including trial of estrogen tablets (reports she is post-menopausal and this could be related to atrophy, vs recheck in clinic and recheck wet prep as well as STI vs referral to GYN. She was unsure. She will call when she decides.

## 2021-07-30 ENCOUNTER — Telehealth: Payer: Self-pay | Admitting: Gastroenterology

## 2021-07-30 NOTE — Telephone Encounter (Signed)
Inbound call from patient states she is afraid to eat because of her diarrhea.

## 2021-07-30 NOTE — Telephone Encounter (Signed)
Patient called in stating she is afraid to eat because it may cause diarrhea. She stopped taking her budesonide, even though it was helping, because she felt that it was interfering with her sleep. Patient advised to try foods that are bland for now, until further recommendations.   Dr. Ardis Hughs, do you have any suggestions for this patient? Thanks!

## 2021-07-31 ENCOUNTER — Other Ambulatory Visit: Payer: Self-pay

## 2021-07-31 DIAGNOSIS — R197 Diarrhea, unspecified: Secondary | ICD-10-CM

## 2021-07-31 NOTE — Telephone Encounter (Signed)
Spoke with patient regarding recommendations, and she was not pleased. She stated the imodium is too expensive. Patient educated that she could try the dollar tree for a similar brand of medication, as well as utilize her Lomotil as needed. Patient scheduled for a follow up appointment 09/21/21 at 9:10 am with DJ. She is aware that she will need to come in for lab work a few days prior to her appointment. Patient verbalized understanding, no further questions.

## 2021-08-04 NOTE — Progress Notes (Signed)
? ? ?  SUBJECTIVE:  ? ?CHIEF COMPLAINT / HPI:  ? ?Concerns Regarding Vaginal Odor ?Kimberly Browning is a 46 y.o. female who presents to the [facility] clinic for continued vaginal discharge. She was last seen in our clinic on 2/16 for vaginal odor and discharge.  Wet prep was negative for BV, yeast, trichomonas.  She declined other STI testing at that time.  She was advised that her vaginal discharge was likely physiologic in nature and there was no indication for additional medications.  She was given a handout for routine vaginal and vulvar hygiene including avoidance of intravaginal over-the-counter treatments and douching.  ? ?Loose Stools ?Reports that she has to go to the bathroom multiple times a day. She is not eating much lately to help. She cannot estimate how many times she has to go to the bathroom. Her rectum burns but denies any blood. Does not report feeling a bulge. No abdominal pain. She has been followed by GI for her ongoing symptoms. She had a colonoscopy on 12/23 which only showed a 2 mm polyp that was resected. Biopsies showed collagenous colitis.  ? ? ?PERTINENT  PMH / PSH:  ?GERD with esophagitis, HLD, bipolar disorder, depression with anxiety, herpes labialis  ? ? ?OBJECTIVE:  ? ?BP 115/76   Pulse 80   Wt 132 lb (59.9 kg)   LMP 07/31/2020   SpO2 100%   BMI 23.38 kg/m?   ? ?General: NAD, pleasant, able to participate in exam ?Cardiac: RRR, no murmurs. ?Abdomen: Bowel sounds present, nontender, nondistended, no hepatosplenomegaly. ?Psych: Pressured speech, tangential, restless  ? ? ?ASSESSMENT/PLAN:  ? ?Vaginal odor ?Ongoing problem. At last appointment she had a wet prep that was negative.  She reports not being sexually active in over 1 year therefore declined any STI testing.  Denies any vaginal discharge.  Indicates that the odor is "loud".  We discussed physiologic discharge and that vaginas are self-cleansing. She was provided a handout at last visit with recommendations on what to do and  what not to do (soaps, douching, etc). Likely benign and reassurance provided today. No repeat testing performed today. ? ?Loose stools ?Reports ongoing loose stools.  She is also followed by GI and had a colonoscopy in December which was consistent with collagenous colitis.  She continues to have ongoing loose stools and reports burning in her rectum but no bloody stools.  She did mention that she is not eating solid foods.  I do have some concern regarding possible body dysmorphia as patient seems focused on weight loss.  Her BMI is now in the normal range.  We discussed how increased fiber and regular meals are important for stools.  I gave her a goal to eat at least 2 meals each day and to record her food intake.  She should bring this at follow-up.  I also recommended that she start taking Metamucil fiber supplements.  Was able to discuss with her sister on the phone as well and shared my recommendations.  I also placed a referral to Dr. Jenne Campus for guidance on nutrition recommendations and concernfor possible eating restriction/body dysmorphia. ?  ? ? ?[name], DO ?[place]  ? ?

## 2021-08-04 NOTE — Patient Instructions (Addendum)
It was wonderful to see you today. ? ?Please bring ALL of your medications with you to every visit.  ? ?Today we talked about: ? ?-Eat at least 2 meals a day. ?-Write down what you are eating.  ?-Try to increase your fiber.  ? ? ? ?I would recommend that you call Dr. Jenne Campus (our nutritionist) to set up an appointment. Her phone number is: [phone]. ? ? ?Thank you for choosing [facility].  ? ?Please call [phone] with any questions about today's appointment. ? ?Please be sure to schedule follow up at the front  desk before you leave today.  ? ?[name], DO ?PGY-2 Family Medicine   ?

## 2021-08-06 ENCOUNTER — Other Ambulatory Visit: Payer: Self-pay | Admitting: Family Medicine

## 2021-08-07 ENCOUNTER — Other Ambulatory Visit: Payer: Self-pay

## 2021-08-07 ENCOUNTER — Ambulatory Visit (INDEPENDENT_AMBULATORY_CARE_PROVIDER_SITE_OTHER): Payer: Medicare Other | Admitting: Family Medicine

## 2021-08-07 ENCOUNTER — Encounter: Payer: Self-pay | Admitting: Family Medicine

## 2021-08-07 VITALS — BP 115/76 | HR 80 | Wt 132.0 lb

## 2021-08-07 DIAGNOSIS — N898 Other specified noninflammatory disorders of vagina: Secondary | ICD-10-CM | POA: Diagnosis not present

## 2021-08-07 DIAGNOSIS — R195 Other fecal abnormalities: Secondary | ICD-10-CM | POA: Insufficient documentation

## 2021-08-07 DIAGNOSIS — R197 Diarrhea, unspecified: Secondary | ICD-10-CM | POA: Diagnosis not present

## 2021-08-07 DIAGNOSIS — R634 Abnormal weight loss: Secondary | ICD-10-CM

## 2021-08-07 NOTE — Assessment & Plan Note (Signed)
Ongoing problem. At last appointment she had a wet prep that was negative.  She reports not being sexually active in over 1 year therefore declined any STI testing.  Denies any vaginal discharge.  Indicates that the odor is "loud".  We discussed physiologic discharge and that vaginas are self-cleansing. She was provided a handout at last visit with recommendations on what to do and what not to do (soaps, douching, etc). Likely benign and reassurance provided today. No repeat testing performed today. ?

## 2021-08-07 NOTE — Assessment & Plan Note (Addendum)
Reports ongoing loose stools.  She is also followed by GI and had a colonoscopy in December which was consistent with collagenous colitis.  She continues to have ongoing loose stools and reports burning in her rectum but no bloody stools.  She did mention that she is not eating solid foods.  I do have some concern regarding possible body dysmorphia as patient seems focused on weight loss.  Her BMI is now in the normal range.  We discussed how increased fiber and regular meals are important for stools.  I gave her a goal to eat at least 2 meals each day and to record her food intake.  She should bring this at follow-up.  I also recommended that she start taking Metamucil fiber supplements.  Was able to discuss with her sister on the phone as well and shared my recommendations.  I also placed a referral to Dr. Jenne Campus for guidance on nutrition recommendations and concernfor possible eating restriction/body dysmorphia. ?

## 2021-08-19 ENCOUNTER — Other Ambulatory Visit: Payer: Self-pay

## 2021-08-19 MED ORDER — TRULICITY 0.75 MG/0.5ML ~~LOC~~ SOAJ: SUBCUTANEOUS | 0 refills | Status: DC

## 2021-08-20 ENCOUNTER — Telehealth: Payer: Self-pay | Admitting: Gastroenterology

## 2021-08-20 NOTE — Telephone Encounter (Signed)
Patient called and stated that her symptoms have not got any better and she is still having diarrhea 2 to 3 times a day and that she is having issues sleeping at night as well do to this issue. Please advise.   ?

## 2021-08-20 NOTE — Telephone Encounter (Signed)
Patient called in with complaints of diarrhea for the last week. She states she is going 2 to 3 times/day. She has been taking lomotil every 6 hours as needed & benefiber daily with no relief. She says imodium doesn't work and is too expensive.  ? ?Dr. Ardis Hughs, please advise. Thank you.  ?

## 2021-08-21 NOTE — Telephone Encounter (Signed)
Spoke with patient regarding recommendations. Pt verbalized understanding, no further questions. ?

## 2021-08-24 NOTE — Telephone Encounter (Signed)
The pt has been advised to cut back on lomotil if she believes it is causing her to not sleep and she can discuss further at her upcoming office visit.  The pt has been advised of the information and verbalized understanding.    ?

## 2021-08-24 NOTE — Telephone Encounter (Signed)
Inbound call from patient. States the medication is giving her the side effects of unable to go to sleep at night.  ?

## 2021-08-24 NOTE — Telephone Encounter (Signed)
Dr Ardis Hughs the pt states that now she is unable to sleep at night due to the lomotil increase.  Please advise  ?

## 2021-08-25 ENCOUNTER — Telehealth: Payer: Self-pay | Admitting: Gastroenterology

## 2021-08-25 NOTE — Telephone Encounter (Signed)
Patient called stating that LOMOTIL is not allowing her to sleep and is really nervous to continue taking it. Please advise. ?

## 2021-08-25 NOTE — Telephone Encounter (Signed)
The pt was again advised that per yesterday's recommendation per Dr Ardis Hughs she can stop or decrease the lomotil and try imodium and keep appt for 4/17 with DJ to discuss.  She agrees and will keep appt  ?

## 2021-08-27 ENCOUNTER — Ambulatory Visit (HOSPITAL_BASED_OUTPATIENT_CLINIC_OR_DEPARTMENT_OTHER): Payer: Medicare Other | Admitting: Nurse Practitioner

## 2021-09-07 NOTE — Telephone Encounter (Signed)
The pt will keep appt for 4/17 to discuss diarrhea and lomotil issues  ?

## 2021-09-07 NOTE — Telephone Encounter (Signed)
Left message on machine to call back  

## 2021-09-07 NOTE — Telephone Encounter (Signed)
Inbound call from patient states she is having difficulty with Lomotil ?

## 2021-09-15 ENCOUNTER — Other Ambulatory Visit: Payer: Self-pay

## 2021-09-15 ENCOUNTER — Telehealth: Payer: Self-pay | Admitting: Gastroenterology

## 2021-09-15 MED ORDER — DIPHENOXYLATE-ATROPINE 2.5-0.025 MG PO TABS: 1.0000 | ORAL_TABLET | Freq: Four times a day (QID) | ORAL | 1 refills | Status: DC | PRN

## 2021-09-15 NOTE — Telephone Encounter (Signed)
Sent prescription for Lomotil to patients pharmacy. ?

## 2021-09-15 NOTE — Telephone Encounter (Signed)
Patient called requesting LOMOTIL refill. Per patient, she only has 3 left. Please advise.  ?

## 2021-09-16 ENCOUNTER — Other Ambulatory Visit: Payer: Self-pay

## 2021-09-16 MED ORDER — TRULICITY 0.75 MG/0.5ML ~~LOC~~ SOAJ: SUBCUTANEOUS | 0 refills | Status: DC

## 2021-09-17 NOTE — Patient Instructions (Addendum)
?Tension Headache, Adult ?A tension headache is a feeling of pain, pressure, or aching in the head. It is often felt over the front and sides of the head. Tension headaches can last from 30 minutes to several days. ?What are the causes? ?The cause of this condition is not known. Sometimes, tension headaches are brought on by stress, worry (anxiety), or depression. Other things that may set them off include: ?Alcohol. ?Too much caffeine or caffeine withdrawal. ?Colds, flu, or sinus infections. ?Dental problems. This can include clenching your teeth. ?Being tired. ?Holding your head and neck in the same position for a long time, such as while using a computer. ?Smoking. ?Arthritis in the neck. ?What are the signs or symptoms? ?Feeling pressure around the head. ?A dull ache in the head. ?Pain over the front and sides of the head. ?Feeling sore or tender in the muscles of the head, neck, and shoulders. ?How is this treated? ?This condition may be treated with lifestyle changes and with medicines that help relieve symptoms. ?Follow these instructions at home: ?Managing pain ?Take over-the-counter and prescription medicines only as told by your doctor. ?When you have a headache, lie down in a dark, quiet room. ?If told, put ice on your head and neck. To do this: ?Put ice in a plastic bag. ?Place a towel between your skin and the bag. ?Leave the ice on for 20 minutes, 2-3 times a day. ?Take off the ice if your skin turns bright red. This is very important. If you cannot feel pain, heat, or cold, you have a greater risk of damage to the area. ?If told, put heat on the back of your neck. Do this as often as told by your doctor. Use the heat source that your doctor recommends, such as a moist heat pack or a heating pad. ?Place a towel between your skin and the heat source. ?Leave the heat on for 20-30 minutes. ?Take off the heat if your skin turns bright red. This is very important. If you cannot feel pain, heat, or cold,  you have a greater risk of getting burned. ?Eating and drinking ?Eat meals on a regular schedule. ?If you drink alcohol: ?Limit how much you have to: ?0-1 drink a day for women who are not pregnant. ?0-2 drinks a day for men. ?Know how much alcohol is in your drink. In the [place] one drink equals one 12 oz bottle of beer (355 mL), one 5 oz glass of wine (148 mL), or one 1? oz glass of hard liquor (44 mL). ?Drink enough fluid to keep your pee (urine) pale yellow. ?Do not use a lot of caffeine, or stop using caffeine. ?Lifestyle ?Get 7-9 hours of sleep each night. Or get the amount of sleep that your doctor tells you to. ?At bedtime, keep computers, phones, and tablets out of your room. ?Find ways to lessen your stress. This may include: ?Exercise. ?Deep breathing. ?Yoga. ?Listening to music. ?Thinking positive thoughts. ?Sit up straight. Try to relax your muscles. ?Do not smoke or use any products that contain nicotine or tobacco. If you need help quitting, ask your doctor. ?General instructions ? ?Avoid things that can bring on headaches. Keep a headache journal to see what may bring on headaches. For example, write down: ?What you eat and drink. ?How much sleep you get. ?Any change to your diet or medicines. ?Keep all follow-up visits. ?Contact a doctor if: ?Your headache does not get better. ?Your headache comes back. ?You have a headache, and sounds,  light, or smells bother you. ?You feel like you may vomit, or you vomit. ?Your stomach hurts. ?Get help right away if: ?You all of a sudden get a very bad headache with any of these things: ?A stiff neck. ?Feeling like you may vomit. ?Vomiting. ?Feeling mixed up (confused). ?Feeling weak in one part or one side of your body. ?Having trouble seeing or speaking, or both. ?Feeling short of breath. ?A rash. ?Feeling very sleepy. ?Pain in your eye or ear. ?Trouble walking or balancing. ?Feeling like you will faint, or you faint. ?Summary ?A tension headache is pain,  pressure, or aching in your head. ?Tension headaches can last from 30 minutes to several days. ?Lifestyle changes and medicines may help relieve pain. ?This information is not intended to replace advice given to you by your health care provider. Make sure you discuss any questions you have with your health care provider. ?Document Revised: 02/21/2020 Document Reviewed: 02/21/2020 ?Elsevier Patient Education ? [place]. ? ?Thank you for choosing [facility].  ? ?Please call [phone] with any questions about today's appointment. ? ?Please be sure to schedule follow up at the front  desk before you leave today.  ? ?[name], DO ?PGY-2 Family Medicine   ?

## 2021-09-17 NOTE — Progress Notes (Signed)
? ? ?  SUBJECTIVE:  ? ?CHIEF COMPLAINT / HPI:  ? ?Headaches ?Has been having frontal headaches every day. She is taking Advil which doesn't help. Feels like a pressure. Sitting and "hoping they'll go away" sometimes helps. Nothing makes it worse. Drinks about 12 oz of water each day. No caffeine. "Trying" to eat regular meals. Doesn't eat breakfast, only sometimes eats lunch but eats dinner.  ? ?Weight Loss  Decreased Appetite  ?At last visit she was provided with instructions to contact our nutritionist for further recommendations in regards to her diet. Patient has been seen several times for similar concerns and there was concern for possible restrictive eating. She only eats one regular meal daily. She is followed by Psychiatry as well. She reports decreased appetite. Weight today 130 lbs, 132 lbs at last clinic appointment on 3/3.  ? ?PERTINENT  PMH / PSH:  ?Past Medical History:  ?Diagnosis Date  ? Anxiety   ? Bipolar disorder ([place])   ? Depression   ? Dry skin   ? hands  ? Feeling of incomplete bladder emptying   ? GERD (gastroesophageal reflux disease)   ? Hypercholesteremia   ? Nocturia   ? Sleep apnea   ? Urge urinary incontinence   ? Vitamin D deficiency   ? Wears contact lenses   ? ? ?OBJECTIVE:  ? ?BP 132/74   Pulse 82   Wt 130 lb 3.2 oz (59.1 kg)   LMP 07/31/2020   SpO2 98%   BMI 23.06 kg/m?   ? ?General: NAD, pleasant, able to participate in exam ?Respiratory: Normal respiratory effort ?Skin: warm and dry, no rashes noted ?Neuro: Cranial Nerves: ?II: Visual Fields are full. PERRL.  ?III,IV, VI: EOMI without ptosis or diplopia.  ?V: Facial sensation is symmetric to temperature ?VII: Facial movement is symmetric.  ?VIII: hearing is intact to voice ?X: Palate elevates symmetrically ?XI: Shoulder shrug is symmetric. ?XII: tongue is midline without atrophy or fasciculations.  ?Motor: ?Tone is normal. Bulk is normal. 5/5 strength was present in all four extremities.  ?Cerebellar: ?Rapid alternating hand  movements intact ?Psych: Normal affect and mood, pressured speech  ? ?ASSESSMENT/PLAN:  ? ?Tension headache ?Headaches appear most consistent with tension type headaches.  Patient could improve in limiting screen time, increasing hydration, increasing exercise, limiting medication use (to prevent medication over-use headaches), and should eat regular meals.  Her neurological exam was unremarkable.  Very low suspicion for any more serious intracranial pathology.  Will trial conservative treatments with lifestyle modifications for now, return if worsening or if no improvement. ? ?Weight loss ?She has been on several different GLP-1's since January 2022.  Her starting weight was 180 pounds, she is now 130 pounds.  BMI is 23 today.  Given her decreased appetite, and her continued diarrhea we discussed medication cessation today. I think she has benefited greatly from the GLP-1 and has seen significant weight loss but at this time it seems to be causing more adverse effects and should be stopped. Patient was amenable to this. Will not refill in the future.  ?  ? ?[name], DO ?[place]  ? ?

## 2021-09-21 ENCOUNTER — Encounter: Payer: Self-pay | Admitting: Family Medicine

## 2021-09-21 ENCOUNTER — Ambulatory Visit (INDEPENDENT_AMBULATORY_CARE_PROVIDER_SITE_OTHER): Payer: Medicare Other | Admitting: Gastroenterology

## 2021-09-21 ENCOUNTER — Ambulatory Visit (INDEPENDENT_AMBULATORY_CARE_PROVIDER_SITE_OTHER): Payer: Medicare Other | Admitting: Family Medicine

## 2021-09-21 ENCOUNTER — Encounter: Payer: Self-pay | Admitting: Gastroenterology

## 2021-09-21 ENCOUNTER — Other Ambulatory Visit: Payer: Self-pay

## 2021-09-21 VITALS — BP 102/64 | HR 76 | Ht 63.0 in | Wt 130.2 lb

## 2021-09-21 DIAGNOSIS — G44209 Tension-type headache, unspecified, not intractable: Secondary | ICD-10-CM | POA: Diagnosis not present

## 2021-09-21 DIAGNOSIS — R197 Diarrhea, unspecified: Secondary | ICD-10-CM | POA: Diagnosis not present

## 2021-09-21 DIAGNOSIS — R634 Abnormal weight loss: Secondary | ICD-10-CM | POA: Insufficient documentation

## 2021-09-21 NOTE — Assessment & Plan Note (Signed)
She has been on several different GLP-1's since January 2022.  Her starting weight was 180 pounds, she is now 130 pounds.  BMI is 23 today.  Given her decreased appetite, and her continued diarrhea we discussed medication cessation today. I think she has benefited greatly from the GLP-1 and has seen significant weight loss but at this time it seems to be causing more adverse effects and should be stopped. Patient was amenable to this. Will not refill in the future.  ?

## 2021-09-21 NOTE — Progress Notes (Signed)
Review of pertinent gastrointestinal problems: ?1.  Collagenous colitis.  Colonoscopy December 2022 Dr. Ardis Hughs for chronic diarrhea found a normal terminal ileum, 2 mm polyp was removed, the colon was randomly biopsied.  Polyp was not precancerous, the biopsies showed "microscopic colitis, collagenous colitis":  ?2023 reported adverse reactions to many different antidiarrheal medicines. ?2022 testing celiac sprue testing all negative, TSH normal, GI pathogen panel normal, inflammatory markers negative ?2.  GERD, EGD 10/2020 showed mild nonspecific gastritis.  Biopsies of stomach showed no H. pylori.  Biopsies of her esophagus showed reflux changes, no eosinophilic changes.  Her symptoms were controlled on proton pump inhibitor once daily.  Dysphagia led to barium esophagram 11/2020 and it was completely normal ? ? ?HPI: ?This is a pleasant 46 year old woman whom I last saw about 2 and half months ago ? ?Her weight is down 3 pounds since her last office visit here about 2 and half months ago.  Same scale. ? ?Today she tells me that budesonide caused significant worsening of her insomnia and so she stopped taking it.  Imodium is much too expensive and so she did not take it.  Lomotil however seems to work quite well if she takes a single pill every morning shortly after waking.  She wants to stop taking pills.  She is looking for a "cure" for her problem. ? ? ?ROS: complete GI ROS as described in HPI, all other review negative. ? ?Constitutional:  No unintentional weight loss ? ? ?Past Medical History:  ?Diagnosis Date  ? Anxiety   ? Bipolar disorder ([place])   ? Depression   ? Dry skin   ? hands  ? Feeling of incomplete bladder emptying   ? GERD (gastroesophageal reflux disease)   ? Hypercholesteremia   ? Nocturia   ? Sleep apnea   ? Urge urinary incontinence   ? Vitamin D deficiency   ? Wears contact lenses   ? ? ?Past Surgical History:  ?Procedure Laterality Date  ? BREAST REDUCTION SURGERY  05/10/2011  ? Procedure:  MAMMARY REDUCTION BILATERAL (BREAST);  Surgeon: Hermelinda Dellen;  Location: [facility];  Service: Plastics;  Laterality: Bilateral;  bilateral breast reduction  ? DILATION AND EVACUATION  12/26/2008  ? x 2  ? INTERSTIM IMPLANT PLACEMENT N/A 07/09/2014  ? Procedure: INTERSTIM IMPLANT FIRST STAGE;  Surgeon: Reece Packer, MD;  Location: [facility];  Service: Urology;  Laterality: N/A;  ? INTERSTIM IMPLANT PLACEMENT N/A 07/09/2014  ? Procedure: INTERSTIM IMPLANT SECOND STAGE;  Surgeon: Reece Packer, MD;  Location: [facility];  Service: Urology;  Laterality: N/A;  ? INTRAUTERINE DEVICE (IUD) INSERTION  sept 2011  ? mirena  ? WISDOM TOOTH EXTRACTION  as a teenager  ? ? ?Current Outpatient Medications  ?Medication Instructions  ? amantadine (SYMMETREL) 100 mg, Oral, Daily  ? dexlansoprazole (DEXILANT) 60 mg, Oral, Daily before breakfast  ? diphenoxylate-atropine (LOMOTIL) 2.5-0.025 MG tablet 1 tablet, Oral, Every 6 hours PRN  ? Dulaglutide (TRULICITY) 9.52 WU/1.3KG SOPN INJECT 0.75 MG  SUBCUTANEOUSLY ONCE A WEEK  ? lamoTRIgine (LAMICTAL) 400 mg, Oral, Daily  ? metoCLOPramide (REGLAN) 10 MG tablet TAKE 1 TABLET BY MOUTH EVERY 6 HOURS AS NEEDED FOR NAUSEA FOR VOMITING  ? mirtazapine (REMERON) 30 mg, Oral, Daily at bedtime  ? temazepam (RESTORIL) 15 MG capsule 3 capsules, Oral, Daily  ? ? ?Allergies as of 09/21/2021 - Review Complete 09/21/2021  ?Allergen Reaction Noted  ? Geodon [ziprasidone hydrochloride] Shortness Of Breath and  Swelling 05/03/2011  ? Hibiclens [chlorhexidine] Itching 07/09/2014  ? Sulfamethoxazole-trimethoprim Other (See Comments) 11/15/2013  ? Doxycycline Other (See Comments) 12/27/2017  ? Gabapentin Other (See Comments) 12/27/2017  ? Other Other (See Comments) 07/14/2015  ? Penicillins Other (See Comments) 12/27/2017  ? Promethazine Other (See Comments) 12/27/2017  ? Depakote [divalproex sodium] Rash 07/04/2014  ? ? ?Family History  ?Adopted: Yes   ? ? ?Social History  ? ?Socioeconomic History  ? Marital status: Divorced  ?  Spouse name: Not on file  ? Number of children: 1  ? Years of education: Not on file  ? Highest education level: Not on file  ?Occupational History  ? Occupation: disablitiy  ?Tobacco Use  ? Smoking status: Former  ?  Packs/day: 0.25  ?  Years: 19.00  ?  Pack years: 4.75  ?  Types: Cigarettes  ?  Quit date: 09/23/2019  ?  Years since quitting: 1.9  ? Smokeless tobacco: Never  ? Tobacco comments:  ?   1 pp3d  ?Vaping Use  ? Vaping Use: Never used  ?Substance and Sexual Activity  ? Alcohol use: Not Currently  ? Drug use: No  ? Sexual activity: Yes  ?  Birth control/protection: None  ?Other Topics Concern  ? Not on file  ?Social History Narrative  ? Not on file  ? ?Social Determinants of Health  ? ?Financial Resource Strain: Not on file  ?Food Insecurity: Not on file  ?Transportation Needs: Not on file  ?Physical Activity: Not on file  ?Stress: Not on file  ?Social Connections: Not on file  ?Intimate Partner Violence: Not on file  ? ? ? ?Physical Exam: ?Ht '5\' 3"'$  (1.6 m)   Wt 130 lb 4 oz (59.1 kg)   LMP 07/31/2020   BMI 23.07 kg/m?  ?Constitutional: generally well-appearing ?Psychiatric: alert and oriented x3 ?Abdomen: soft, nontender, nondistended, no obvious ascites, no peritoneal signs, normal bowel sounds ?No peripheral edema noted in lower extremities ? ?Assessment and plan: ?46 y.o. female with collagenous colitis ? ?Imodium is too expensive, budesonide seems to cause worsening of her chronic insomnia.  Lomotil however seems to work if she takes 1 pill every morning shortly after she wakes like a vitamin.  She rarely has to take a second Lomotil later in the day.  I explained to her that this is probably a pretty good solution for her to take a single Lomotil every single day for her collagenous colitis.  She knows she can take 2 in the morning if she needs or repeated single dose later on the day if she needs.  I see no reason for  further blood tests or imaging studies she will return on an as-needed basis. ? ?Please see the "Patient Instructions" section for addition details about the plan. ? ?Owens Loffler, MD ?[facility] ?09/21/2021, 9:05 AM ? ? ?Total time on date of encounter was 20 minutes (this included time spent preparing to see the patient reviewing records; obtaining and/or reviewing separately obtained history; performing a medically appropriate exam and/or evaluation; counseling and educating the patient and family if present; ordering medications, tests or procedures if applicable; and documenting clinical information in the health record). ? ?

## 2021-09-21 NOTE — Patient Instructions (Signed)
If you are age 45 or older, your body mass index should be between 23-30. Your Body mass index is 23.07 kg/m?Marland Kitchen If this is out of the aforementioned range listed, please consider follow up with your Primary Care Provider. ? ?If you are age 25 or younger, your body mass index should be between 19-25. Your Body mass index is 23.07 kg/m?Marland Kitchen If this is out of the aformentioned range listed, please consider follow up with your Primary Care Provider.  ? ?Continue Lomotil 1-2 pills in the morning. ? ?Follow up as needed. ? ? ?The [facility] providers would like to encourage you to use [facility] to communicate with providers for non-urgent requests or questions.  Due to long hold times on the telephone, sending your provider a message by [facility] may be a faster and more efficient way to get a response.  Please allow 48 business hours for a response.  Please remember that this is for non-urgent requests.  ? ?It was a pleasure to see you today! ? ?Thank you for trusting me with your gastrointestinal care!   ? ? ? ?

## 2021-09-21 NOTE — Assessment & Plan Note (Signed)
Headaches appear most consistent with tension type headaches.  Patient could improve in limiting screen time, increasing hydration, increasing exercise, limiting medication use (to prevent medication over-use headaches), and should eat regular meals.  Her neurological exam was unremarkable.  Very low suspicion for any more serious intracranial pathology.  Will trial conservative treatments with lifestyle modifications for now, return if worsening or if no improvement. ?

## 2021-10-06 ENCOUNTER — Telehealth: Payer: Self-pay | Admitting: Gastroenterology

## 2021-10-06 NOTE — Telephone Encounter (Signed)
Inbound call from patient requesting a call back to discuss what she can take or can she be prescribed something to help with her diarrhea. Reports she is not getting better and getting afraid to eat because it triggers it. Reports Imodium does not work and diphenoxylate hardly works as well  ?

## 2021-10-07 NOTE — Telephone Encounter (Signed)
Spoke with Kimberly Browning and she states she is taking 1 lomotil in the am. States she is having diarrhea daily at 10am. Asked Kimberly Browning if she has tried Imodium, Kimberly Browning reports he cannot afford Imodium. Discussed with Kimberly Browning that she can take lomotil every 6 hours. Kimberly Browning is afraid to take more lomotil because then she can't sleep. Suggested Kimberly Browning take 2 lomotil in the am and see if that helps. She knows to call back if that does not help. ?

## 2021-10-07 NOTE — Progress Notes (Addendum)
? ? ?  SUBJECTIVE:  ? ?CHIEF COMPLAINT / HPI:  ? ?Vaginal Discharge ?Long-standing problem for patient. Reports that "it smells sour". She has not had sex in 1 year. She does not want to be checked for STI's but does want yeast and BV testing.  ? ?Dysuria ?For "a while". Has not noticed any bleeding. No fevers. Not increased frequency, she actually "tries not to go often". She reports that she has had a UTI before but unsure if this feels similar because it has been years.  ? ?PERTINENT  PMH / PSH:  ?Past Medical History:  ?Diagnosis Date  ? Anxiety   ? Bipolar disorder ([place])   ? Depression   ? Dry skin   ? hands  ? Feeling of incomplete bladder emptying   ? GERD (gastroesophageal reflux disease)   ? Hypercholesteremia   ? Nocturia   ? Sleep apnea   ? Urge urinary incontinence   ? Vitamin D deficiency   ? Wears contact lenses   ? ?OBJECTIVE:  ? ?BP 126/71   Pulse 81   Wt 131 lb 12.8 oz (59.8 kg)   LMP 07/31/2020   SpO2 100%   BMI 23.35 kg/m?   ? ?General: NAD, pleasant, able to participate in exam ?Respiratory: Normal respiratory effort ?GU: Normal appearance of labia majora and minora, without lesions. Vagina tissue pink, moist, without lesions or abrasions. Cervix normal appearance, with clear physiologic discharge from os. No cervical motion tenderness. ?Skin: warm and dry, no rashes noted ?Psych: Normal affect and mood ? ?ASSESSMENT/PLAN:  ? ?  ?1. Vaginal discharge ?Longstanding complaint for patient, she has had several wet preps that have been normal.  Discussed that some discharge is physiologic and normal.  Did not appreciate any abnormalities on examination.  Declined STI testing today and she is low risk as she has not had intercourse in 1 year. ?- POCT Wet Prep [facility]) ? ?2. Cervical cancer screening ?Pap with cotesting performed today. ?- Cytology - PAP([place]) ? ?3. Dysuria ?Reports some dysuria without hematuria, fevers, N/V. Declines urinary frequency. UA without leukocytes or nitrites.  Doubt UTI- will not treat empirically at this time and will cancel urine culture. ?- Urinalysis Dipstick ?- Urine Culture (cancelled) ? ? ?[name], DO ?[place]  ? ?

## 2021-10-08 ENCOUNTER — Other Ambulatory Visit (HOSPITAL_COMMUNITY)
Admission: RE | Admit: 2021-10-08 | Discharge: 2021-10-08 | Disposition: A | Discharge status: Home / Self Care | Payer: Medicare Other | Source: Ambulatory Visit | Attending: Family Medicine | Admitting: Family Medicine

## 2021-10-08 ENCOUNTER — Other Ambulatory Visit: Payer: Self-pay

## 2021-10-08 ENCOUNTER — Ambulatory Visit (INDEPENDENT_AMBULATORY_CARE_PROVIDER_SITE_OTHER): Payer: Medicare Other | Admitting: Family Medicine

## 2021-10-08 VITALS — BP 126/71 | HR 81 | Wt 131.8 lb

## 2021-10-08 DIAGNOSIS — Z01419 Encounter for gynecological examination (general) (routine) without abnormal findings: Secondary | ICD-10-CM | POA: Diagnosis present

## 2021-10-08 DIAGNOSIS — R300 Dysuria: Secondary | ICD-10-CM | POA: Diagnosis not present

## 2021-10-08 DIAGNOSIS — Z1151 Encounter for screening for human papillomavirus (HPV): Secondary | ICD-10-CM | POA: Diagnosis not present

## 2021-10-08 DIAGNOSIS — Z124 Encounter for screening for malignant neoplasm of cervix: Secondary | ICD-10-CM | POA: Insufficient documentation

## 2021-10-08 DIAGNOSIS — N898 Other specified noninflammatory disorders of vagina: Secondary | ICD-10-CM

## 2021-10-08 LAB — POCT URINALYSIS DIP (MANUAL ENTRY)
Bilirubin, UA: NEGATIVE
Blood, UA: NEGATIVE
Glucose, UA: NEGATIVE mg/dL
Ketones, POC UA: NEGATIVE mg/dL
Leukocytes, UA: NEGATIVE
Nitrite, UA: NEGATIVE
Protein Ur, POC: NEGATIVE mg/dL
Spec Grav, UA: >=1.03 — AB (ref 1.010–1.025)
Urobilinogen, UA: 0.2 E.U./dL
pH, UA: 5.5 (ref 5.0–8.0)

## 2021-10-08 LAB — POCT WET PREP (WET MOUNT)
Clue Cells Wet Prep Whiff POC: NEGATIVE
Trichomonas Wet Prep HPF POC: ABSENT

## 2021-10-08 NOTE — Patient Instructions (Signed)
It was wonderful to see you today. ? ?Today we talked about: ? ?-We did your Pap smear today and are doing testing for bacterial vaginosis and yeast infection. I will let you know the results.  ?-You can take [facility] Cohosh over the counter to help hot flashes.  ? ? ? ?Thank you for choosing [facility].  ? ?Please call [phone] with any questions about today's appointment. ? ?Please be sure to schedule follow up at the front  desk before you leave today.  ? ?[name], DO ?PGY-2 Family Medicine   ?

## 2021-10-08 NOTE — Addendum Note (Signed)
Addended by: [name] on: 10/08/2021 02:27 PM ? ? Modules accepted: Orders ? ?

## 2021-10-13 LAB — CYTOLOGY - PAP
Comment: NEGATIVE
Diagnosis: NEGATIVE
Diagnosis: REACTIVE
High risk HPV: NEGATIVE

## 2021-10-27 NOTE — Progress Notes (Deleted)
    SUBJECTIVE:   CHIEF COMPLAINT / HPI:   Headaches   PERTINENT  PMH / PSH:  Past Medical History:  Diagnosis Date   Anxiety    Bipolar disorder ([place])    Depression    Dry skin    hands   Feeling of incomplete bladder emptying    GERD (gastroesophageal reflux disease)    Hypercholesteremia    Nocturia    Sleep apnea    Urge urinary incontinence    Vitamin D deficiency    Wears contact lenses      OBJECTIVE:   LMP 07/31/2020  ***  General: NAD, pleasant, able to participate in exam Cardiac: RRR, no murmurs. Respiratory: CTAB, normal effort, No wheezes, rales or rhonchi Abdomen: Bowel sounds present, nontender, nondistended, no hepatosplenomegaly. Extremities: no edema or cyanosis. Skin: warm and dry, no rashes noted Neuro: alert, no obvious focal deficits Psych: Normal affect and mood  ASSESSMENT/PLAN:   No problem-specific Assessment & Plan notes found for this encounter.     [name], [place]

## 2021-10-30 ENCOUNTER — Ambulatory Visit: Payer: Medicare Other | Admitting: Family Medicine

## 2021-11-04 NOTE — Progress Notes (Signed)
    SUBJECTIVE:   CHIEF COMPLAINT / HPI:   Headaches States that she has frontal and temporal headaches daily. Feels sharp and stinging. She isn't sure how long they last because she "ignores" the pain. States that medication does not help it. She does take Advil when it is really severe, maybe once a day.   No nausea or vomiting. No vision changes. No auras. She has trouble falling asleep. She averages maybe 6 hours a night. She is under significant stress at the moment- states that her cat Norina Buzzard has pancreatitis. Eating once a day- doesn't have much of an appetite. Drinks 2 (16 oz) bottles of water a day. Drinks coffee daily in the morning.  PERTINENT  PMH / PSH:  Past Medical History:  Diagnosis Date   Anxiety    Bipolar disorder ([place])    Depression    Dry skin    hands   Feeling of incomplete bladder emptying    GERD (gastroesophageal reflux disease)    Hypercholesteremia    Nocturia    Sleep apnea    Urge urinary incontinence    Vitamin D deficiency    Wears contact lenses    OBJECTIVE:   BP 112/81   Pulse 76   Ht '5\' 3"'$  (1.6 m)   Wt 130 lb (59 kg)   LMP 07/31/2020   BMI 23.03 kg/m    General: NAD, pleasant, able to participate in exam HEENT: Normocephalic, TMJ no clicks, TM clear b/l  Respiratory: Normal effort Skin: warm and dry, no rashes noted Neuro:  Cranial Nerves: II: Visual Fields are full. PERRL.  III,IV, VI: EOMI without ptosis or diplopia.  V: Facial sensation is symmetric to temperature VII: Facial movement is symmetric.  VIII: hearing is intact to voice X: Palate elevates symmetrically XI: Shoulder shrug is symmetric. XII: tongue is midline without atrophy or fasciculations.  Motor: Tone is normal. Bulk is normal. 5/5 strength was present in all four extremities.  Sensory: Sensation is symmetric to light touch and temperature  Gait: Normal  ASSESSMENT/PLAN:   Tension headache History and examination still most consistent with tension  headache.  Doubt any more serious intracranial pathologies as neuro exam unremarkable. She has several risk factors for this including medication overuse, irregular meals, poor sleep, increased stress, poor hydration, caffeine use.  We discussed methods to help with this. She was provided with a handout with additional information as well. Do not feel Neurology referral is warranted at this time. Encouraged patient to make an effort to improve her hydration, avoid caffeine, limit medications, eat regularly, and exercise. She states she has f/u with sleep provider soon.    [name], [place]

## 2021-11-10 ENCOUNTER — Encounter: Payer: Self-pay | Admitting: *Deleted

## 2021-11-13 ENCOUNTER — Encounter: Payer: Self-pay | Admitting: Family Medicine

## 2021-11-13 ENCOUNTER — Ambulatory Visit (INDEPENDENT_AMBULATORY_CARE_PROVIDER_SITE_OTHER): Payer: Medicare Other | Admitting: Family Medicine

## 2021-11-13 VITALS — BP 112/81 | HR 76 | Ht 63.0 in | Wt 130.0 lb

## 2021-11-13 DIAGNOSIS — G44209 Tension-type headache, unspecified, not intractable: Secondary | ICD-10-CM

## 2021-11-13 NOTE — Patient Instructions (Signed)
Tension Headache, Adult A tension headache is a feeling of pain, pressure, or aching in the head. It is often felt over the front and sides of the head. Tension headaches can last from 30 minutes to several days. What are the causes? The cause of this condition is not known. Sometimes, tension headaches are brought on by stress, worry (anxiety), or depression. Other things that may set them off include: Alcohol. Too much caffeine or caffeine withdrawal. Colds, flu, or sinus infections. Dental problems. This can include clenching your teeth. Being tired. Holding your head and neck in the same position for a long time, such as while using a computer. Smoking. Arthritis in the neck. What are the signs or symptoms? Feeling pressure around the head. A dull ache in the head. Pain over the front and sides of the head. Feeling sore or tender in the muscles of the head, neck, and shoulders. How is this treated? This condition may be treated with lifestyle changes and with medicines that help relieve symptoms. Follow these instructions at home: Managing pain Take over-the-counter and prescription medicines only as told by your doctor. When you have a headache, lie down in a dark, quiet room. If told, put ice on your head and neck. To do this: Put ice in a plastic bag. Place a towel between your skin and the bag. Leave the ice on for 20 minutes, 2-3 times a day. Take off the ice if your skin turns bright red. This is very important. If you cannot feel pain, heat, or cold, you have a greater risk of damage to the area. If told, put heat on the back of your neck. Do this as often as told by your doctor. Use the heat source that your doctor recommends, such as a moist heat pack or a heating pad. Place a towel between your skin and the heat source. Leave the heat on for 20-30 minutes. Take off the heat if your skin turns bright red. This is very important. If you cannot feel pain, heat, or cold, you  have a greater risk of getting burned. Eating and drinking Eat meals on a regular schedule. If you drink alcohol: Limit how much you have to: 0-1 drink a day for women who are not pregnant. 0-2 drinks a day for men. Know how much alcohol is in your drink. In the [place] one drink equals one 12 oz bottle of beer (355 mL), one 5 oz glass of wine (148 mL), or one 1 oz glass of hard liquor (44 mL). Drink enough fluid to keep your pee (urine) pale yellow. Do not use a lot of caffeine, or stop using caffeine. Lifestyle Get 7-9 hours of sleep each night. Or get the amount of sleep that your doctor tells you to. At bedtime, keep computers, phones, and tablets out of your room. Find ways to lessen your stress. This may include: Exercise. Deep breathing. Yoga. Listening to music. Thinking positive thoughts. Sit up straight. Try to relax your muscles. Do not smoke or use any products that contain nicotine or tobacco. If you need help quitting, ask your doctor. General instructions  Avoid things that can bring on headaches. Keep a headache journal to see what may bring on headaches. For example, write down: What you eat and drink. How much sleep you get. Any change to your diet or medicines. Keep all follow-up visits. Contact a doctor if: Your headache does not get better. Your headache comes back. You have a headache, and sounds,   light, or smells bother you. You feel like you may vomit, or you vomit. Your stomach hurts. Get help right away if: You all of a sudden get a very bad headache with any of these things: A stiff neck. Feeling like you may vomit. Vomiting. Feeling mixed up (confused). Feeling weak in one part or one side of your body. Having trouble seeing or speaking, or both. Feeling short of breath. A rash. Feeling very sleepy. Pain in your eye or ear. Trouble walking or balancing. Feeling like you will faint, or you faint. Summary A tension headache is pain, pressure,  or aching in your head. Tension headaches can last from 30 minutes to several days. Lifestyle changes and medicines may help relieve pain. This information is not intended to replace advice given to you by your health care provider. Make sure you discuss any questions you have with your health care provider. Document Revised: 02/21/2020 Document Reviewed: 02/21/2020 Elsevier Patient Education  [place] you for choosing [facility].   Please call [phone] with any questions about today's appointment.  Please be sure to schedule follow up at the front  desk before you leave today.   [name], DO PGY-2 Family Medicine

## 2021-11-13 NOTE — Assessment & Plan Note (Addendum)
History and examination still most consistent with tension headache.  Doubt any more serious intracranial pathologies as neuro exam unremarkable. She has several risk factors for this including medication overuse, irregular meals, poor sleep, increased stress, poor hydration, caffeine use.  We discussed methods to help with this. She was provided with a handout with additional information as well. Do not feel Neurology referral is warranted at this time. Encouraged patient to make an effort to improve her hydration, avoid caffeine, limit medications, eat regularly, and exercise. She states she has f/u with sleep provider soon.

## 2021-11-24 ENCOUNTER — Telehealth: Payer: Self-pay

## 2021-11-24 NOTE — Telephone Encounter (Signed)
Patient calls nurse line regarding management of sun burn. Patient reports that she used a new sunscreen on Saturday that resulted in her getting sunburnt. No blisters at this time.   Patient has used banana boat aloe lotion to help with the discomfort, however, reports increased itchiness.  Patient also has questions about showering.   Advised patient to take cool showers, pat dry, and apply moisturizer with aloe. Patient is asking for specific brand. Informed patient that any aloe moisturizer at the pharmacy could help improve symptoms. Also encouraged patient to increase fluids.   Patient is requesting additional recommendations from Dr. Nita Sells regarding specific ointment or lotion to apply to sunburnt areas.   Talbot Grumbling, RN

## 2021-11-25 ENCOUNTER — Encounter: Payer: Self-pay | Admitting: Family Medicine

## 2021-12-08 NOTE — Progress Notes (Signed)
    SUBJECTIVE:   CHIEF COMPLAINT / HPI: headaches   Headache: Patient complains of headache. She does have a headache at this time.   Description of Headaches: Location of pain: bilateral, temporal Radiation of pain?:radiates from front to back of head  Character of pain:sharp Severity of pain: 10 Accompanying symptoms: nausea Rapidity of onset: sudden Typical duration of individual headache: reports at least four daily headaches that last for an unknown amount of time   Temporal Pattern of Headaches: Started having HAs 0 minutes ago, started in exam room  Worst time of day: none specificallly  Awaken from sleep?: she is not sure  Seasonal pattern?: no 'Clustering' of HAs over time? no Overall pattern since problem began: gradually worsening  Additional Relevant History: History of head/neck trauma? no History of head/neck surgery? no Family h/o headache problems? Unknown, patient is adopted     She reports having sharp left temporal HA  Sometimes she has severe pain on the right  HA occur four times per day  Now she has right sided HA  She reports sharp pain  She states that she has to sit still to help with pain  Pain does not subside with advil  She reports that the pain prevents her from looking backwards  She reports that sitting upright and turning her head makes the headache worse  She denies having HA like this before  She denies trauma to her head    PERTINENT  PMH / PSH:  GERD Dysphagia  HLD  Bipolar D/O  Tobacco use  Insomnia   OBJECTIVE:   BP (!) 106/58   Pulse 69   Wt 132 lb (59.9 kg)   LMP 07/31/2020   SpO2 95%   BMI 23.38 kg/m   Physical Exam Constitutional:      General: She is not in acute distress.    Appearance: She is not ill-appearing or diaphoretic.  Pulmonary:     Effort: Pulmonary effort is normal.  Neurological:     Mental Status: She is alert and oriented to person, place, and time.     Cranial Nerves: Cranial nerves  2-12 are intact. No cranial nerve deficit, dysarthria or facial asymmetry.     Sensory: Sensation is intact.     Motor: Motor function is intact. No weakness or abnormal muscle tone.     Coordination: Coordination is intact. Coordination normal. Finger-Nose-Finger Test and Heel to [facility] Test normal.     Gait: Gait is intact.    ASSESSMENT/PLAN:   New daily persistent headache Suspect that this could be migraine HA given nature of pain and pattern of HA persistence  No red flag neurological symptoms or exam findings today that would warrant emergent imaging, patient is neurologically intact with no deficit No lacrimation or rhinorrhea to classify this as cluster HA at this time  Will attempt abortion of HA with sumatriptan today  Will also attempt preventative measures with propranolol '40mg'$  twice daily  Patient to follow up in 1 week      Eulis Foster, MD [place]

## 2021-12-09 ENCOUNTER — Ambulatory Visit (INDEPENDENT_AMBULATORY_CARE_PROVIDER_SITE_OTHER): Payer: Medicare Other | Admitting: Family Medicine

## 2021-12-09 ENCOUNTER — Encounter: Payer: Self-pay | Admitting: Family Medicine

## 2021-12-09 VITALS — BP 106/58 | HR 69 | Wt 132.0 lb

## 2021-12-09 DIAGNOSIS — G4452 New daily persistent headache (NDPH): Secondary | ICD-10-CM

## 2021-12-09 MED ORDER — SUMATRIPTAN SUCCINATE 25 MG PO TABS: 25.0000 mg | ORAL_TABLET | ORAL | 0 refills | Status: DC | PRN

## 2021-12-09 MED ORDER — PROPRANOLOL HCL 40 MG PO TABS
40.0000 mg | ORAL_TABLET | Freq: Two times a day (BID) | ORAL | 0 refills | Status: DC
Start: 2021-12-09 — End: 2022-11-01

## 2021-12-09 NOTE — Patient Instructions (Addendum)
I will start a medication to try and stop your current headache. PLEASE DO NOT TAKE THIS medication unless you have a headache.   Tomorrow, you can start propranolol to try and prevent the headaches from starting.   Please follow up with our clinic in one week to make sure your headaches are controlled.

## 2021-12-10 ENCOUNTER — Telehealth: Payer: Self-pay

## 2021-12-10 DIAGNOSIS — G4452 New daily persistent headache (NDPH): Secondary | ICD-10-CM | POA: Insufficient documentation

## 2021-12-10 NOTE — Assessment & Plan Note (Signed)
Suspect that this could be migraine HA given nature of pain and pattern of HA persistence  No red flag neurological symptoms or exam findings today that would warrant emergent imaging, patient is neurologically intact with no deficit No lacrimation or rhinorrhea to classify this as cluster HA at this time  Will attempt abortion of HA with sumatriptan today  Will also attempt preventative measures with propranolol '40mg'$  twice daily  Patient to follow up in 1 week

## 2021-12-10 NOTE — Telephone Encounter (Signed)
Patient calls nurse line requesting an MRI.   Patient reports she does not want to take Sumatriptan due to the possible side effects she read about.   Patient reports she has had a continued headache since yesterday with no relief.   Patient does not want to take anymore medication until she has imaging done.   Will forward to provider who saw patient.

## 2021-12-11 NOTE — Telephone Encounter (Signed)
Returned call to patient regarding request for MRI.  Patient confirmed that she has upcoming appointment on 12/16/2021 with Dr. Arby Barrette.  Recommended that the patient take notes of her questions as well as her symptoms with her headaches.  Also discussed stepwise approach to treating headaches prior to obtaining imaging.  Recommended that she use her notes with questions and record of her symptoms to discuss during her upcoming appointment.  Also counseled patient that she should be evaluated in the ED if she has numbness, unilateral weakness or numbness, confusion, worsening pain or difficulty swallowing.  Patient states that she has difficulty with transportation as her family is often not amenable to taking her to the emergency department.  Recommended that if these above symptoms arise, she be evaluated even if she has to call EMS services.  Patient voiced understanding.   Eulis Foster, MD [place], PGY-3 [phone]

## 2021-12-16 ENCOUNTER — Ambulatory Visit: Payer: Medicare Other | Admitting: Family Medicine

## 2021-12-16 ENCOUNTER — Ambulatory Visit: Payer: Medicare Other

## 2021-12-16 NOTE — Progress Notes (Signed)
    SUBJECTIVE:   CHIEF COMPLAINT / HPI:   Follow Up on Headaches Kimberly Browning is a 46 y.o. female who presents to the [facility] clinic today to discuss ongoing headaches. These headaches were thought to be tension in nature previously due to her several risk factors (poor sleep, infrequent meals, dehydration, medication overuse, stress). She was last seen on 7/5 and there was concern that headaches were possibly migrainous in nature so she was started on Sumatriptan for abortive therapy. She reports that she is still having headaches.  She is unable to describe them.  States that is daily.  She has stopped taking Advil for it.  She drinks one 12 ounce water bottle daily.  She is now eating 2 meals a day. Still having trouble sleeping. She reports her father is in the hospital for vomiting. 47 drama with having to tell the news to her daughter and stress with her daughter as she does not have a job.  Itchy Scalp Unclear when it started. Has been itchy. Unsure if there are flakes. No new soaps or shampoos. Uses [employer] [place]. Washes hair with shampoo daily.   PERTINENT  PMH / PSH:  Past Medical History:  Diagnosis Date   Anxiety    Bipolar disorder ([place])    Depression    Dry skin    hands   Feeling of incomplete bladder emptying    GERD (gastroesophageal reflux disease)    Hypercholesteremia    Nocturia    Sleep apnea    Urge urinary incontinence    Vitamin D deficiency    Wears contact lenses    OBJECTIVE:   BP 116/69   Pulse 79   Wt 132 lb 3.2 oz (60 kg)   LMP 07/31/2020   SpO2 100%   BMI 23.42 kg/m    General: NAD, pleasant, able to participate in exam Head: Dandruff seen, no erythema of scalp Respiratory: Normal respiratory effort Neuro: alert, no obvious focal deficits.  Normal speech, normal gait, follows commands. Psych: Normal affect and mood      12/09/2021   11:22 AM 10/08/2021    1:34 PM 09/21/2021    2:07 PM  Depression screen PHQ 2/9  Decreased Interest '3 1  3  '$ Down, Depressed, Hopeless '3 2 3  '$ PHQ - 2 Score '6 3 6  '$ Altered sleeping '3 3 3  '$ Tired, decreased energy '3 1 3  '$ Change in appetite 0 0 0  Feeling bad or failure about yourself  3 0 3  Trouble concentrating 0 0 0  Moving slowly or fidgety/restless 0 3 3  Suicidal thoughts 0 0 0  PHQ-9 Score '15 10 18    '$ ASSESSMENT/PLAN:   Seborrheic dermatitis Recommended use of Selsun Blue.  Advised not to wash hair daily with shampoo as this can cause over drying.  Tension headache Continue to suspect that her headaches are tension in nature.  She still has several risk factors.  Neuro exam remains unremarkable and nonfocal. She will work on hydration, meals, sleep, and stressors. Advised her to try and get in with her counselor as she endorses many stressors.     [name], [place]

## 2021-12-16 NOTE — Progress Notes (Deleted)
  SUBJECTIVE:   CHIEF COMPLAINT / HPI:     PERTINENT  PMH / PSH: GERD, Dysphagia, HLD, bipolar, tobacco use, insomnia  OBJECTIVE:  LMP 07/31/2020   General: NAD, pleasant, able to participate in exam Cardiac: RRR, no murmurs auscultated Respiratory: CTAB, normal WOB Abdomen: soft, non-tender, non-distended, normoactive bowel sounds Extremities: warm and well perfused, no edema or cyanosis Skin: warm and dry, no rashes noted Neuro: alert, no obvious focal deficits, speech normal Psych: Normal affect and mood  ASSESSMENT/PLAN:  No problem-specific Assessment & Plan notes found for this encounter.   No orders of the defined types were placed in this encounter.  No orders of the defined types were placed in this encounter.  No follow-ups on file. Wells Guiles, DO 12/16/2021, 8:07 AM PGY-***, [facility] {    This will disappear when note is signed, click to select method of visit    :1}

## 2021-12-23 ENCOUNTER — Encounter: Payer: Self-pay | Admitting: Family Medicine

## 2021-12-23 ENCOUNTER — Other Ambulatory Visit: Payer: Self-pay

## 2021-12-23 ENCOUNTER — Ambulatory Visit (INDEPENDENT_AMBULATORY_CARE_PROVIDER_SITE_OTHER): Payer: Medicare Other | Admitting: Family Medicine

## 2021-12-23 DIAGNOSIS — G44209 Tension-type headache, unspecified, not intractable: Secondary | ICD-10-CM | POA: Diagnosis not present

## 2021-12-23 DIAGNOSIS — L219 Seborrheic dermatitis, unspecified: Secondary | ICD-10-CM | POA: Diagnosis present

## 2021-12-23 NOTE — Assessment & Plan Note (Signed)
Continue to suspect that her headaches are tension in nature.  She still has several risk factors.  Neuro exam remains unremarkable and nonfocal. She will work on hydration, meals, sleep, and stressors. Advised her to try and get in with her counselor as she endorses many stressors.

## 2021-12-23 NOTE — Assessment & Plan Note (Signed)
Recommended use of Selsun Blue.  Advised not to wash hair daily with shampoo as this can cause over drying.

## 2021-12-23 NOTE — Patient Instructions (Addendum)
It was wonderful to see you today.  Please bring ALL of your medications with you to every visit.   Today we talked about:  -Work on Academic librarian intake. See if you can get in with counseling. Try to sleep at least 8 hours a night. Eat 3 regular meals a day. -I recommend you start a shampoo called Selsun Blue. Don't use this every day as it can dry your hair out. Use it 2-3 times a day.      Thank you for choosing [facility].   Please call [phone] with any questions about today's appointment.  Please be sure to schedule follow up at the front  desk before you leave today.   [name], DO PGY-3 Family Medicine

## 2021-12-28 ENCOUNTER — Telehealth: Payer: Self-pay

## 2021-12-28 NOTE — Telephone Encounter (Signed)
Patient calls nurse line requesting a referral to neurology for headaches.   Will forward to PCP.

## 2021-12-29 ENCOUNTER — Encounter: Payer: Self-pay | Admitting: Neurology

## 2021-12-29 ENCOUNTER — Other Ambulatory Visit: Payer: Self-pay | Admitting: Family Medicine

## 2021-12-29 DIAGNOSIS — G44209 Tension-type headache, unspecified, not intractable: Secondary | ICD-10-CM

## 2021-12-29 NOTE — Telephone Encounter (Signed)
Referral placed.

## 2021-12-31 ENCOUNTER — Telehealth: Payer: Self-pay

## 2021-12-31 NOTE — Telephone Encounter (Signed)
Patient calls nurse line regarding Neurology referral. Patient scheduled at [facility]. However, they are not able to see her until October.   Patient is requesting that referral be sent to an office that could see her sooner.   Advised patient that specialty offices typically have a longer wait list and that she could call and ask to be placed on a cancellation list.   Patient states that she will look into this, however, is interested in alternative office that could see her sooner.   Will forward to referral coordinator.   Talbot Grumbling, RN

## 2022-01-01 NOTE — Telephone Encounter (Signed)
Will ask MD to place an additional referral for neurology for me to send to another office.  Since patient has already scheduled the appt with [facility], I won't be able to pull it.  I spoke with [facility] neurology and they aren't able to say how long a patient would have to wait to be seen until they are able tor review the reason for the referral.  Kimberly Browning,CMA

## 2022-01-03 ENCOUNTER — Other Ambulatory Visit: Payer: Self-pay | Admitting: Family Medicine

## 2022-01-03 DIAGNOSIS — G44209 Tension-type headache, unspecified, not intractable: Secondary | ICD-10-CM

## 2022-01-13 ENCOUNTER — Encounter (INDEPENDENT_AMBULATORY_CARE_PROVIDER_SITE_OTHER): Payer: Self-pay

## 2022-02-04 ENCOUNTER — Encounter: Payer: Self-pay | Admitting: Neurology

## 2022-02-04 ENCOUNTER — Ambulatory Visit (INDEPENDENT_AMBULATORY_CARE_PROVIDER_SITE_OTHER): Payer: Medicare Other | Admitting: Neurology

## 2022-02-04 VITALS — BP 112/70 | HR 60 | Ht 64.0 in | Wt 130.8 lb

## 2022-02-04 DIAGNOSIS — R519 Headache, unspecified: Secondary | ICD-10-CM | POA: Diagnosis not present

## 2022-02-04 DIAGNOSIS — R0683 Snoring: Secondary | ICD-10-CM

## 2022-02-04 DIAGNOSIS — G479 Sleep disorder, unspecified: Secondary | ICD-10-CM | POA: Diagnosis not present

## 2022-02-04 NOTE — Progress Notes (Addendum)
Subjective:    Patient ID: Kimberly Browning is a 46 y.o. female.  HPI    [name], MD, PhD [facility] [address] [place], [place] [address]  Dear Dr. Ardelia Mems,   I saw your patient, Kimberly Browning, upon your kind request, in my neurologic clinic today for initial consultation of her recurrent headaches.  The patient is unaccompanied today.  As you know, Kimberly Browning is a 46 year old left-handed woman with an underlying medical history of vitamin D deficiency, reflux disease, hyperlipidemia, depression, anxiety, sleep apnea, who reports recurrent headaches. For about 2 months she has had frequent headaches, frequency not exactly clear, sometimes the headache starts in the back on the right side and may radiate forward, sometimes it hurts on the left and sometimes both sides.  She does not have obvious triggers.  She feels like she hydrates well with water but upon further questioning she drinks 1 bottle of water per day, 12 ounce size.  She does not drink any alcohol, she limits her caffeine to 1 cup of coffee in the morning.  She quit smoking 3 years ago.  She reports that she is divorced, and that her daughter's father died in 21-Aug-2017.  She lives with her 29 year old daughter, they have 1 cat and 1 hamster in the household.  She does not work, she is on disability.   I reviewed her primary care visit note with family medicine on 12/09/2021.  She reported recurrent headaches particularly in the right temporal area.  She was suspected to have migraines.  She was advised to try abortive treatment with Imitrex and preventative treatment with propranolol 40 mg twice daily.  She has a follow-up appointment with family medicine on 12/23/2021 and saw Dr. Precious Haws.  She reported ongoing headaches.  Headaches were deemed secondary to multiple triggers including poor sleep, and frequent meals, dehydration, medication overuse, stress.  She was advised to work on hydration,  meals, sleep and stressors.  She was advised to try to get in touch with her counselor as she endorsed many stressors. I had evaluated her nearly 4 years ago for vertigo.   She had a brain MRI w/wo contrast on 04/17/18 and I reviewed the results: IMPRESSION:    Unremarkable MRI brain (with and without). No acute findings.   She reports that she is here to schedule an MRI and that is why she was referred.  She was advised that we can certainly proceed with an MRI request through her insurance but she will need insurance authorization first.  She is unclear as to how long she tried the propranolol.  She believes that she has been on it for months and that it either did not help or she had side effects.  She is not sure how many times she tried the sumatriptan, unclear if it helped, unclear if it had side effects.  She does not sleep very well.  She occasionally wakes up with a headache.  She snores per daughter.  She denies night to night nocturia.  She has regular eye exams, she goes once a year.  She reports having bifocal eyeglasses.  She wears contact lenses.  She denies any blurry vision.    Previously:   03/06/18: 46 year old right-handed woman with an underlying medical history of osteoarthritis, seasonal allergies, restless leg syndrome, reflux disease, depression, hyperlipidemia, vitamin D deficiency, and obesity, who reports recurrent vertigo symptoms for years. She also reports hearing loss but is unsure how long she  has had it at which ear. She also reports recurrent tinnitus in both ears, unclear how long she has had it. I reviewed your office note from 12/23/2017, which you kindly included. She has had symptoms of restless leg syndrome and was started on ropinirole. She was also given a prescription for Benadryl for as needed use for her vertigo. She does not take the Benadryl any longer. She does not take the Requip any longer due to side effects. She has been on doxepin for sleep for  years as I understand. She lives alone, her 70 year old daughter lives with paternal grandparents. She smokes about one carton per month, does not utilize alcohol or caffeine on a regular basis. She has not had an MRI brain. She has not had physical therapy, she has not seen ENT. She had a head CT without contrast in the past on 11/12/2009 and I reviewed the results. IMPRESSION:   No specific intracranial abnormality is identified. (Indication: headache and dizziness).  Her Past Medical History Is Significant For: Past Medical History:  Diagnosis Date   Anxiety    Bipolar disorder ([place])    Depression    Dry skin    hands   Feeling of incomplete bladder emptying    GERD (gastroesophageal reflux disease)    Hypercholesteremia    Nocturia    Sleep apnea    Urge urinary incontinence    Vitamin D deficiency    Wears contact lenses     Her Past Surgical History Is Significant For: Past Surgical History:  Procedure Laterality Date   BREAST REDUCTION SURGERY  05/10/2011   Procedure: MAMMARY REDUCTION BILATERAL (BREAST);  Surgeon: Hermelinda Dellen;  Location: [place];  Service: Plastics;  Laterality: Bilateral;  bilateral breast reduction   DILATION AND EVACUATION  12/26/2008   x 2   INTERSTIM IMPLANT PLACEMENT N/A 07/09/2014   Procedure: Barrie Lyme IMPLANT FIRST STAGE;  Surgeon: Reece Packer, MD;  Location: [facility];  Service: Urology;  Laterality: N/A;   INTERSTIM IMPLANT PLACEMENT N/A 07/09/2014   Procedure: Barrie Lyme IMPLANT SECOND STAGE;  Surgeon: Reece Packer, MD;  Location: [place];  Service: Urology;  Laterality: N/A;   INTRAUTERINE DEVICE (IUD) INSERTION  sept 2011   mirena   WISDOM TOOTH EXTRACTION  as a teenager    Her Family History Is Significant For: Family History  Adopted: Yes    Her Social History Is Significant For: Social History   Socioeconomic History   Marital status: Divorced    Spouse name:  Not on file   Number of children: 1   Years of education: Not on file   Highest education level: Not on file  Occupational History   Occupation: disablitiy  Tobacco Use   Smoking status: Former    Packs/day: 0.25    Years: 19.00    Total pack years: 4.75    Types: Cigarettes    Quit date: 09/23/2019    Years since quitting: 2.3    Passive exposure: Past   Smokeless tobacco: Never   Tobacco comments:     1 pp3d  Vaping Use   Vaping Use: Some days  Substance and Sexual Activity   Alcohol use: Not Currently   Drug use: No   Sexual activity: Yes    Birth control/protection: None  Other Topics Concern   Not on file  Social History Narrative   Caffeine: cup coffee daily   Working: on disability    Education: 12 th  grade   Live alone, one daughter.    Social Determinants of Health   Financial Resource Strain: Not on file  Food Insecurity: No Food Insecurity (01/21/2020)   Hunger Vital Sign    Worried About Running Out of Food in the Last Year: Never true    Ran Out of Food in the Last Year: Never true  Transportation Needs: No Transportation Needs (01/21/2020)   PRAPARE - Hydrologist (Medical): No    Lack of Transportation (Non-Medical): No  Physical Activity: Not on file  Stress: Not on file  Social Connections: Not on file    Her Allergies Are:  Allergies  Allergen Reactions   Geodon [Ziprasidone Hydrochloride] Shortness Of Breath and Swelling   Hibiclens [Chlorhexidine] Itching    Redness and itching after using CHG wipes   Sulfamethoxazole-Trimethoprim Other (See Comments)    hallucinations   Doxycycline Other (See Comments)    Unknown reaction   Gabapentin Other (See Comments)    Unknown reaction   Other Other (See Comments)    Bath and body works body wash and lotion caused itching, reddness and rash   Penicillins Other (See Comments)    Unknown reaction Did it involve swelling of the face/tongue/throat, SOB, or low BP?  Unknown Did it involve sudden or severe rash/hives, skin peeling, or any reaction on the inside of your mouth or nose? Unknown Did you need to seek medical attention at a hospital or doctor's office? Unknown When did it last happen?  unknown     If all above answers are "NO", may proceed with cephalosporin use.   Promethazine Other (See Comments)   Depakote [Divalproex Sodium] Rash  :   Her Current Medications Are:  Outpatient Encounter Medications as of 02/04/2022  Medication Sig   amantadine (SYMMETREL) 100 MG capsule Take 100 mg by mouth daily.   lamoTRIgine (LAMICTAL) 200 MG tablet Take 400 mg by mouth daily.   metoCLOPramide (REGLAN) 10 MG tablet TAKE 1 TABLET BY MOUTH EVERY 6 HOURS AS NEEDED FOR NAUSEA FOR VOMITING   mirtazapine (REMERON) 30 MG tablet Take 30 mg by mouth at bedtime.   temazepam (RESTORIL) 15 MG capsule Take 3 capsules by mouth daily.   propranolol (INDERAL) 40 MG tablet Take 1 tablet (40 mg total) by mouth 2 (two) times daily. (Patient not taking: Reported on 02/04/2022)   SUMAtriptan (IMITREX) 25 MG tablet Take 1 tablet (25 mg total) by mouth every 2 (two) hours as needed for migraine or headache. May repeat in 2 hours if headache persists or recurs. (Patient not taking: Reported on 02/04/2022)   No facility-administered encounter medications on file as of 02/04/2022.  :   Review of Systems:  Out of a complete 14 point review of systems, all are reviewed and negative with the exception of these symptoms as listed below:   Review of Systems  Neurological:        Pt here for tension headaches for the last 2 months w/ nausea. Tried advil did not help. She could not relay about the sumatriptan/propranolol(she is not taking).      Objective:  Neurological Exam  Physical Exam Physical Examination:   Vitals:   02/04/22 0807  BP: 112/70  Pulse: 60   General Examination: The patient is a very pleasant 46 y.o. female in no acute distress. She appears  well-developed and well-nourished and well groomed.   HEENT: Normocephalic, atraumatic, pupils are equal, round and reactive to light and accommodation. Funduscopic  exam is nonfocal, she has contact lenses in place.  Hearing is grossly intact to tuning fork. Face is symmetric with normal facial animation and normal facial sensation to light touch, temperature and vibration. Speech is clear with no dysarthria noted. There is no hypophonia. There is no lip, neck/head, jaw or voice tremor. Neck is supple with full range of passive and active motion. There are no carotid bruits on auscultation. Oropharynx exam reveals: moderate mouth dryness, dentures on top, edentulous on the bottom.  Mallampati class II.  Neck circumference of 13-1/2 inches.     Chest: Clear to auscultation without wheezing, rhonchi or crackles noted.   Heart: S1+S2+0, regular and normal without murmurs, rubs or gallops noted.    Abdomen: Soft, non-tender and non-distended.   Extremities: There is no pitting edema in the distal lower extremities bilaterally.   Skin: Warm and dry.   Musculoskeletal: exam reveals no obvious joint deformities.    Neurologically:  Mental status: The patient is awake, alert and oriented in all 4 spheres. Her immediate and remote memory, attention, language skills and fund of knowledge are appropriate. There is no evidence of aphasia, agnosia, apraxia or anomia. Speech is clear with normal prosody and enunciation. Thought process is linear. Mood is normal and affect is normal.  Cranial nerves II - XII are as described above under HEENT exam. In addition: shoulder shrug is normal with equal shoulder height noted. Motor exam: Normal bulk, strength and tone is noted. There is no drift, tremor or rebound. Romberg is negative. Reflexes are 2+ throughout. Babinski: Toes are flexor bilaterally. Fine motor skills and coordination: intact with normal finger taps, normal hand movements, normal rapid alternating  patting, normal foot taps and normal foot agility.  Cerebellar testing: No dysmetria or intention tremor on finger to nose testing. Heel to shin is unremarkable bilaterally. There is no truncal or gait ataxia.  Sensory exam: intact to light touch, vibration, and temperature sense in the upper and lower extremities.  Gait, station and balance: She stands easily. No veering to one side is noted. No leaning to one side is noted. Posture is age-appropriate and stance is narrow based. Gait shows normal stride length and normal pace. No problems turning are noted. Tandem walk is challenging for her.      Assessment and Plan:    In summary, Kimberly Browning is a 46 year old right-handed woman with an underlying medical history of vitamin D deficiency, reflux disease, hyperlipidemia, depression, anxiety, sleep apnea, who presents for evaluation of her recurrent headaches of about 2 months duration.  Headache description is not classic for migraine headaches.  She has not noticed any telltale trigger but does endorse not sleeping well, she actually does not always hydrate well with water and we talked about certain headache triggers today.  Neurological exam is nonfocal and she is reassured in that regard. We will proceed with a brain MRI with and without contrast to rule out a structural cause of her headaches.  Furthermore, she is agreeable to pursuing a sleep study to rule out underlying obstructive sleep apnea as she does have a history of snoring and not sleeping well.  She has tried preventative medication with propranolol but it is unclear how long she took it.  She reports that it either did not help or she had side effects.  She may have tried Imitrex a few times but is not sure how many times, if she tried it, if it helped or if she had side effects.  At this juncture, we mutually agreed not to start any new medications but to do diagnostic work-up.  We will keep her posted as to her test results by phone call  and plan a follow-up as needed accordingly.  I answered all her questions today and she was in agreement.   Thank you very much for allowing me to participate in the care of this nice patient. If I can be of any further assistance to you please do not hesitate to call me at [phone].   Sincerely,     [name], MD, PhD  02/22/2022: Addendum: Placed an order for sleep testing today.

## 2022-02-04 NOTE — Patient Instructions (Addendum)
Your headaches may be from a combination of factors including dehydration, not sleeping well, stress.  I would like for you to try to hydrate better with water, drink about 4-5 12 ounce bottles of water per day.  Continue to limit your caffeine as you are.  Please try to get enough rest, 7 to 8 hours of sleep are generally recommended.  Your neurological exam is benign.  We will proceed with a brain MRI to rule out a structural cause of your headaches.  We will also proceed with a sleep study as discussed.  If you have a condition called obstructive sleep apnea, you may benefit from treatment with a so-called CPAP or AutoPap machine.  We will keep you posted as to your test results by phone call for now.

## 2022-02-05 ENCOUNTER — Telehealth: Payer: Self-pay | Admitting: Neurology

## 2022-02-05 NOTE — Telephone Encounter (Signed)
medicare/medicaid NPR sent to GI

## 2022-02-22 ENCOUNTER — Ambulatory Visit
Admission: RE | Admit: 2022-02-22 | Discharge: 2022-02-22 | Disposition: A | Discharge status: Home / Self Care | Payer: Medicare Other | Source: Ambulatory Visit | Attending: Neurology | Admitting: Neurology

## 2022-02-22 DIAGNOSIS — R0683 Snoring: Secondary | ICD-10-CM

## 2022-02-22 DIAGNOSIS — R519 Headache, unspecified: Secondary | ICD-10-CM | POA: Diagnosis not present

## 2022-02-22 DIAGNOSIS — G479 Sleep disorder, unspecified: Secondary | ICD-10-CM

## 2022-02-22 MED ORDER — GADOBENATE DIMEGLUMINE 529 MG/ML IV SOLN
13.0000 mL | Freq: Once | INTRAVENOUS | Status: AC | PRN
  Administered 2022-02-22: 13 mL via INTRAVENOUS

## 2022-02-22 NOTE — Addendum Note (Signed)
Addended by: [name] on: 02/22/2022 05:10 PM   Modules accepted: Orders

## 2022-02-24 ENCOUNTER — Telehealth: Payer: Self-pay | Admitting: *Deleted

## 2022-02-24 ENCOUNTER — Telehealth: Payer: Self-pay | Admitting: Neurology

## 2022-02-24 NOTE — Telephone Encounter (Signed)
-----   Message from [name], MD sent at 02/22/2022  5:11 PM EDT ----- Please advise patient that her brain MRI showed benign findings.  We had talked about pursuing sleep testing, I may not have entered an order at the time of her visit but ordered a request for sleep testing today. Our sleep lab staff should be in touch with her after authorization to schedule her sleep study.

## 2022-02-24 NOTE — Telephone Encounter (Signed)
NPSG- Medicare/medicaid no auth req.  Patient is scheduled at [facility] for 03/23/22 at 8 pm.  Mailed packet to the patient.

## 2022-02-28 NOTE — Progress Notes (Deleted)
    SUBJECTIVE:   CHIEF COMPLAINT / HPI:   Kimberly Browning is a 46 y.o. female who presents to the [facility] clinic today to discuss the following concerns:   "Cant control hunger"   PERTINENT  PMH / PSH: Schizophrenia   OBJECTIVE:   LMP 07/31/2020    General: NAD, pleasant, able to participate in exam Cardiac: RRR, no murmurs. Respiratory: CTAB, normal effort, No wheezes, rales or rhonchi Abdomen: Bowel sounds present, nontender, nondistended, no hepatosplenomegaly. Extremities: no edema or cyanosis. Skin: warm and dry, no rashes noted Neuro: alert, no obvious focal deficits Psych: Normal affect and mood  ASSESSMENT/PLAN:   No problem-specific Assessment & Plan notes found for this encounter.     [name], [place]

## 2022-03-03 ENCOUNTER — Ambulatory Visit: Payer: Medicare Other | Admitting: Family Medicine

## 2022-03-10 ENCOUNTER — Ambulatory Visit: Payer: Medicare Other | Admitting: Neurology

## 2022-03-23 ENCOUNTER — Ambulatory Visit (INDEPENDENT_AMBULATORY_CARE_PROVIDER_SITE_OTHER): Payer: Medicare Other | Admitting: Neurology

## 2022-03-23 DIAGNOSIS — R0683 Snoring: Secondary | ICD-10-CM

## 2022-03-23 DIAGNOSIS — G479 Sleep disorder, unspecified: Secondary | ICD-10-CM

## 2022-03-23 DIAGNOSIS — R519 Headache, unspecified: Secondary | ICD-10-CM

## 2022-03-23 DIAGNOSIS — G4720 Circadian rhythm sleep disorder, unspecified type: Secondary | ICD-10-CM

## 2022-03-23 DIAGNOSIS — G478 Other sleep disorders: Secondary | ICD-10-CM

## 2022-03-29 NOTE — Procedures (Signed)
Physician Interpretation:     [facility] POLYSOMNOGRAPHY  INTERPRETATION REPORT   STUDY DATE:  03/23/2022     PATIENT NAME:  Kimberly Browning         DATE OF BIRTH:  03-03-1976  PATIENT ID:  [id number]    TYPE OF STUDY:  PSG  READING PHYSICIAN: [name], MD, PhD REFERRED BY: Dr. Abbe Amsterdam TECHNICIAN: Richard Miu, RPSGT  HISTORY:  46 year old left-handed woman with an underlying medical history of vitamin D deficiency, reflux disease, hyperlipidemia, depression, anxiety, prior Dx of sleep apnea, who reports?recurrent headaches.  Height: 64 in Weight: 130 lb (BMI 22) Neck Size: 14 in  MEDICATIONS: Symmetrel, Lamictal, Reglan, Remeron, Restoril, Inderal, Imitrex TECHNICAL DESCRIPTION: A registered sleep technologist was in attendance for the duration of the recording.  Data collection, scoring, video monitoring, and reporting were performed in compliance with the AASM Manual for the Scoring of Sleep and Associated Events; (Hypopnea is scored based on the criteria listed in Section VIII D. 1b in the AASM Manual V2.6 using a 4% oxygen desaturation rule or Hypopnea is scored based on the criteria listed in Section VIII D. 1a in the AASM Manual V2.6 using 3% oxygen desaturation and /or arousal rule).   SLEEP CONTINUITY AND SLEEP ARCHITECTURE:  Lights-out was at 20:14: and lights-on at  04:40:, with a total recording time of 8 hours, 26 min. Total sleep time ( TST) was 196.0 minutes with a markedly reduced sleep efficiency of 38.7%.  BODY POSITION:  TST was divided  between the following sleep positions: 48.5% supine;  51.5% lateral;  0% prone. Duration of total sleep and percent of total sleep in their respective position is as follows: supine 95 minutes (48%), non-supine 101 minutes (52%); right 49 minutes (25%), left 52 minutes (27%), and prone 00 minutes (0%).  Total supine REM sleep time was 00 minutes (0% of total REM sleep). Sleep latency was delayed at  174.5 minutes. REM sleep latency was normal at 103.5 minutes. Of the total sleep time, the percentage of stage N1 sleep was 3.8%, stage N2 sleep was 74%, which is markedly increased, stage N3 sleep was 16.8%, which is normal and REM sleep was 5.4%, which is markedly reduced. Wake after sleep onset (WASO) time accounted for 135.5 minutes with moderate sleep fragmentation noted.   RESPIRATORY MONITORING:   Based on CMS criteria (using a 4% oxygen desaturation rule for scoring hypopneas), there were 0 apneas (0 obstructive; 0 central; 0 mixed), and 1 hypopneas.  Apnea index was 0.0. Hypopnea index was 0.3. The apnea-hypopnea index was 0.3/hour overall (0.0 supine, 0 non-supine; 0.0 REM, 0.0 supine REM).  There were 0 respiratory effort-related arousals (RERAs).  The RERA index was 0 events/h. Total respiratory disturbance index (RDI) was 0.3 events/h. RDI results showed: supine RDI  0.0 /h; non-supine RDI 0.6 /h; REM RDI 0.0 /h, supine REM RDI 0.0 /h.   Based on AASM criteria (using a 3% oxygen desaturation and /or arousal rule for scoring hypopneas), there were 0 apneas (0 obstructive; 0 central; 0 mixed), and 1 hypopneas. Apnea index was 0.0. Hypopnea index was 0.3. The apnea-hypopnea index was 0.3 overall (0.0 supine, 0 non-supine; 0.0 REM, 0.0 supine REM).  There were 0 respiratory effort-related arousals (RERAs).  The RERA index was 0 events/h. Total respiratory disturbance index (RDI) was 0.3 events/h. RDI results showed: supine RDI  0.0 /h; non-supine RDI 0.6 /h; REM RDI 0.0 /h, supine REM RDI 0.0 /h.   OXIMETRY: Oxyhemoglobin  Saturation Nadir during sleep was at  91%) from a mean of 97%.  Of the Total sleep time (TST)   hypoxemia (=<88%) was present for  0.0 minutes, or 0.0% of total sleep time.   LIMB MOVEMENTS: There were 0 periodic limb movements of sleep (0.0/hr), of which 0 (0.0/hr) were associated with an arousal.  AROUSAL: There were 17 arousals in total, for an arousal index of 5  arousals/hour.  Of these, 1 were identified as respiratory-related arousals (0 /h), 0 were PLM-related arousals (0 /h), and 27 were non-specific arousals (8 /h).  EEG: Review of the EEG showed no abnormal electrical discharges and symmetrical bihemispheric findings.    EKG: The EKG revealed normal sinus rhythm (NSR). The average heart rate during sleep was 57 bpm. The heart rate during sleep varied between a minimum of N/A and a maximum of 74 bpm.  AUDIO/VIDEO REVIEW: The audio and video review did not show any abnormal or unusual behaviors, movements, phonations or vocalizations. The patient took to restroom breaks.  No audible snoring was noted.    POST-STUDY QUESTIONNAIRE: Post study, the patient indicated, that sleep was worse than usual.   IMPRESSION:     1. Dysfunctions associated with sleep stages or arousal from sleep 2. Poor sleep pattern  RECOMMENDATIONS:   1. This study does not demonstrate any significant obstructive or central sleep disordered breathing with an AHI of less than 5/hour.  Her total AHI was 0.3/h, O2 nadir 91%.  No significant snoring was noted. Treatment with a positive airway pressure device, such as CPAP or autoPAP is not indicated.   2. This study shows a decrease sleep efficiency, difficulty initiating sleep and maintaining sleep, sleep fragmentation and abnormal sleep stage percentages; these are nonspecific findings and per se do not signify an intrinsic sleep disorder or a cause for the patient's sleep-related symptoms. Causes include (but are not limited to) the first night effect of the sleep study, circadian rhythm disturbances, medication effect or an underlying mood disorder or medical problem.  3. The patient should be cautioned not to drive, work at heights, or operate dangerous or heavy equipment when tired or sleepy. Review and reiteration of good sleep hygiene measures should be pursued with any patient. 4. The patient will be advised to follow up at  [facility]. Her referring provider will be notified of the test results.   I certify that I have reviewed the entire raw data recording prior to the issuance of this report in accordance with the Standards of Accreditation of the American   Academy of Sleep Medicine (AASM). [name], MD, PhD [facility]) [place], ABPN (Neurology and Sleep)              Technical Report:   General Information  Name: Alyse, Kathan BMI: 22.31 Physician: [name], MD  ID: [id number] Height: 64.0 in Technician: Richard Miu, RPSGT  Sex: Female Weight: 130.0 lb Record: [id number]  Age: 39 [[birth year]] Date: 03/23/2022    Medical & Medication History    46 year old left-handed woman with an underlying medical history of vitamin D deficiency, reflux disease, hyperlipidemia, depression, anxiety, sleep apnea, who reports recurrent headaches. For about 2 months she has had frequent headaches, frequency not exactly clear, sometimes the headache starts in the back on the right side and may radiate forward, sometimes it hurts on the left and sometimes both sides. She does not have obvious triggers.  Symmetrel, Lamictal, Reglan, Remeron, Restoril, Inderal, Imitrex   Sleep Disorder  Comments   The patient had a PSG study. She had two restroom breaks. EKG kept in NSR. No snoring heard. All sleep stages witnessed. Patient slept supine and lateral. Some spontaneous arousals.  No respiratory events seen with a 4% desat. Ended the study a little early as pt had been awake for a while. Also, she keep messing with the EEG leads and causing them to come off. Seemed to be intentional. She messed with the equipment that was on her during the study anytime she was awake.    Lights out: 08:14:18 PM Lights on: 04:40:42 AM   Time Total Supine Side Prone Upright  Recording (TRT) 8h 26.22m5h 0.066mh 26.63m54m 0.63m 387m0.63m  31mep (TST) 3h 16.63m 1h81m.63m 1h 263m63m 0h 0463m 0h 0.663m  Late49m N1 N2  N3 REM Onset Per. Slp. Eff.  Actual 0h 0.63m 0h 4.87m67m 19.87m2m 43.87m 763m54.87m 239m7.87m 3867m%   Stg48mr Wake N1 N2 N3 REM  Total 310.0 7.5 145.0 33.0 10.5  Supine 205.0 2.5 62.0 30.5 0.0  Side 105.0 5.0 83.0 2.5 10.5  Prone 0.0 0.0 0.0 0.0 0.0  Upright 0.0 0.0 0.0 0.0 0.0   Stg % Wake N1 N2 N3 REM  Total 61.3 3.8 74.0 16.8 5.4  Supine 40.5 1.3 31.6 15.6 0.0  Side 20.8 2.6 42.3 1.3 5.4  Prone 0.0 0.0 0.0 0.0 0.0  Upright 0.0 0.0 0.0 0.0 0.0     Apnea Summary Sub Supine Side Prone Upright  Total 0 Total 0 0 0 0 0    REM 0 0 0 0 0    NREM 0 0 0 0 0  Obs 0 REM 0 0 0 0 0    NREM 0 0 0 0 0  Mix 0 REM 0 0 0 0 0    NREM 0 0 0 0 0  Cen 0 REM 0 0 0 0 0    NREM 0 0 0 0 0   Rera Summary Sub Supine Side Prone Upright  Total 0 Total 0 0 0 0 0    REM 0 0 0 0 0    NREM 0 0 0 0 0   Hypopnea Summary Sub Supine Side Prone Upright  Total 1 Total 1 0 1 0 0    REM 0 0 0 0 0    NREM 1 0 1 0 0   4% Hypopnea Summary Sub Supine Side Prone Upright  Total (4%) 1 Total 1 0 1 0 0    REM 0 0 0 0 0    NREM 1 0 1 0 0     AHI Total Obs Mix Cen  0.31 Apnea 0.00 0.00 0.00 0.00   Hypopnea 0.31 -- -- --  0.31 Hypopnea (4%) 0.31 -- -- --    Total Supine Side Prone Upright  Position AHI 0.31 0.00 0.59 0.00 0.00  REM AHI 0.00   NREM AHI 0.32   Position RDI 0.31 0.00 0.59 0.00 0.00  REM RDI 0.00   NREM RDI 0.32    4% Hypopnea Total Supine Side Prone Upright  Position AHI (4%) 0.31 0.00 0.59 0.00 0.00  REM AHI (4%) 0.00   NREM AHI (4%) 0.32   Position RDI (4%) 0.31 0.00 0.59 0.00 0.00  REM RDI (4%) 0.00   NREM RDI (4%) 0.32    Desaturation Information Threshold: 2% <100% <90% <80% <70% <60% <50% <40%  Supine 181.0 31.0 8.0 0.0 0.0 0.0 0.0  Side 80.0 0.0 0.0 0.0 0.0 0.0 0.0  Prone 0.0 0.0 0.0 0.0 0.0 0.0 0.0  Upright 0.0 0.0 0.0 0.0 0.0 0.0 0.0  Total 261.0 31.0 8.0 0.0 0.0 0.0 0.0  Index 66.2 7.9 2.0 0.0 0.0 0.0 0.0   Threshold: 3% <100% <90% <80% <70% <60% <50% <40%  Supine 122.0  31.0 8.0 0.0 0.0 0.0 0.0  Side 26.0 0.0 0.0 0.0 0.0 0.0 0.0  Prone 0.0 0.0 0.0 0.0 0.0 0.0 0.0  Upright 0.0 0.0 0.0 0.0 0.0 0.0 0.0  Total 148.0 31.0 8.0 0.0 0.0 0.0 0.0  Index 37.5 7.9 2.0 0.0 0.0 0.0 0.0   Threshold: 4% <100% <90% <80% <70% <60% <50% <40%  Supine 81.0 30.0 8.0 0.0 0.0 0.0 0.0  Side 8.0 0.0 0.0 0.0 0.0 0.0 0.0  Prone 0.0 0.0 0.0 0.0 0.0 0.0 0.0  Upright 0.0 0.0 0.0 0.0 0.0 0.0 0.0  Total 89.0 30.0 8.0 0.0 0.0 0.0 0.0  Index 22.6 7.6 2.0 0.0 0.0 0.0 0.0   Threshold: 3% <100% <90% <80% <70% <60% <50% <40%  Supine 122 31 8 0 0 0 0  Side 26 0 0 0 0 0 0  Prone 0 0 0 0 0 0 0  Upright 0 0 0 0 0 0 0  Total 148 31 8 0 0 0 0   Awakening/Arousal Information # of Awakenings 16  Wake after sleep onset 135.41m Wake after persistent sleep 133.587m Arousal Assoc. Arousals Index  Apneas 0 0.0  Hypopneas 1 0.3  Leg Movements 0 0.0  Snore 0 0.0  PTT Arousals 0 0.0  Spontaneous 28 8.6  Total 29 8.9  Leg Movement Information PLMS LMs Index  Total LMs during PLMS 0 0.0  LMs w/ Microarousals 0 0.0   LM LMs Index  w/ Microarousal 0 0.0  w/ Awakening 0 0.0  w/ Resp Event 0 0.0  Spontaneous 2 0.6  Total 2 0.6     Desaturation threshold setting: 3% Minimum desaturation setting: 10 seconds SaO2 nadir: 68% The longest event was a 23 sec obstructive Hypopnea with a minimum SaO2 of 91%. The lowest SaO2 was 91% associated with a 23 sec obstructive Hypopnea. EKG Rates EKG Avg Max Min  Awake 67 114 49  Asleep 57 74 48  EKG Events: Tachycardia

## 2022-03-30 ENCOUNTER — Telehealth: Payer: Self-pay

## 2022-03-30 ENCOUNTER — Telehealth: Payer: Self-pay | Admitting: Neurology

## 2022-03-30 NOTE — Telephone Encounter (Signed)
-----   Message from Kimberly Age, MD sent at 03/29/2022  4:31 PM EDT ----- Patient was referred for recurrent headaches, I saw her on 02/04/2022 and she had a sleep study on 03/23/2022.  Please advise her that her sleep study did not show any significant obstructive sleep apnea.  Please offer her a follow-up appointment for headache recheck as per her preference.

## 2022-03-30 NOTE — Telephone Encounter (Signed)
I called patient.  I discussed her sleep study results.  She would like a follow-up with Dr. Rexene Alberts to discuss her headaches and treatment.  I offered her the soonest available appointment of May 13, 2022 at 10:45 AM.  Patient accepted this appointment but would like to be placed on a wait list for sooner appointment if available.  We will call her if a sooner appointment comes available.  Patient verbalized understanding of results.

## 2022-04-01 NOTE — Telephone Encounter (Signed)
Pt returned called. Pt was ask to accept appointment in 10/31 @ 8:45 am. Pt refused and said that's way to early for me.

## 2022-04-07 NOTE — Progress Notes (Deleted)
    SUBJECTIVE:   CHIEF COMPLAINT / HPI:   Kimberly Browning is a 46 y.o. female who presents to the [facility] clinic today to discuss the following concerns:   ***  PERTINENT  PMH / PSH: ***  OBJECTIVE:   LMP 07/31/2020    General: NAD, pleasant, able to participate in exam Cardiac: RRR, no murmurs. Respiratory: CTAB, normal effort, No wheezes, rales or rhonchi Abdomen: Bowel sounds present, nontender, nondistended, no hepatosplenomegaly. Extremities: no edema or cyanosis. Skin: warm and dry, no rashes noted Neuro: alert, no obvious focal deficits Psych: Normal affect and mood  ASSESSMENT/PLAN:   No problem-specific Assessment & Plan notes found for this encounter.     [name], [place]

## 2022-04-12 ENCOUNTER — Ambulatory Visit: Payer: Medicare Other | Admitting: Family Medicine

## 2022-04-19 ENCOUNTER — Ambulatory Visit: Payer: Medicare Other | Admitting: Family Medicine

## 2022-04-19 ENCOUNTER — Encounter: Payer: Self-pay | Admitting: Neurology

## 2022-04-19 ENCOUNTER — Ambulatory Visit (INDEPENDENT_AMBULATORY_CARE_PROVIDER_SITE_OTHER): Payer: Medicare Other | Admitting: Neurology

## 2022-04-19 VITALS — BP 114/68 | HR 68 | Ht 63.0 in | Wt 124.6 lb

## 2022-04-19 DIAGNOSIS — R519 Headache, unspecified: Secondary | ICD-10-CM

## 2022-04-19 NOTE — Progress Notes (Signed)
Subjective:    Patient ID: Kimberly Browning is a 46 y.o. female.  HPI    Interim history:   Kimberly Browning is a 46 year old left-handed woman with an underlying medical history of vitamin D deficiency, reflux disease, hyperlipidemia, depression, anxiety, sleep apnea, who presents for follow-up consultation of her recurrent headaches after interim testing.  The patient is unaccompanied today.  I saw her at the request of her primary care on 02/04/2022, at which time she reported a 49-monthhistory of frequent headaches.  She was advised to proceed with additional testing including a brain MRI as well as a sleep study.  She had a baseline sleep study on 03/23/2022 which did not show any significant obstructive or central sleep apnea with an AHI of 0.3/h, O2 nadir 91%, no significant snoring.  She did have decreased sleep efficiency, difficulty initiating and maintaining sleep.  She had a brain MRI w/wo contrast on 02/22/22 and I reviewed the results:    IMPRESSION:   This is a normal MRI of the brain with and without contrast     Today, 04/19/2022: She reports nearly daily headaches.  She does not currently have a headache.  She had an up-to-date eye examination this year.  She initially indicated that she does not take propranolol or sumatriptan and but then reports taking sumatriptan hand only as needed and taking propranolol at bedtime only.  She reports not taking any caffeine daily.  She takes mirtazapine at bedtime, 30 mg.  She is followed by, LNoemi Chapel Browning for mental health.  Previously:   02/04/22: (She) reports recurrent headaches. For about 2 months she has had frequent headaches, frequency not exactly clear, sometimes the headache starts in the back on the right side and may radiate forward, sometimes it hurts on the left and sometimes both sides.  She does not have obvious triggers.  She feels like she hydrates well with water but upon further questioning she drinks 1 bottle of water per day,  12 ounce size.  She does not drink any alcohol, she limits her caffeine to 1 cup of coffee in the morning.  She quit smoking 3 years ago.  She reports that she is divorced, and that her daughter's father died in 2Apr 09, 2019  She lives with her 155year old daughter, they have 1 cat and 1 hamster in the household.  She does not work, she is on disability.   I reviewed her primary care visit note with family medicine on 12/09/2021.  She reported recurrent headaches particularly in the right temporal area.  She was suspected to have migraines.  She was advised to try abortive treatment with Imitrex and preventative treatment with propranolol 40 mg twice daily.  She has a follow-up appointment with family medicine on 12/23/2021 and saw Dr. EPrecious Haws  She reported ongoing headaches.  Headaches were deemed secondary to multiple triggers including poor sleep, and frequent meals, dehydration, medication overuse, stress.  She was advised to work on hydration, meals, sleep and stressors.  She was advised to try to get in touch with her counselor as she endorsed many stressors. I had evaluated her nearly 4 years ago for vertigo.    She had a brain MRI w/wo contrast on 04/17/18 and I reviewed the results: IMPRESSION:    Unremarkable MRI brain (with and without). No acute findings.   She reports that she is here to schedule an MRI and that is why she was referred.  She was advised that we can certainly proceed with  an MRI request through her insurance but she will need insurance authorization first.  She is unclear as to how long she tried the propranolol.  She believes that she has been on it for months and that it either did not help or she had side effects.  She is not sure how many times she tried the sumatriptan, unclear if it helped, unclear if it had side effects.   She does not sleep very well.  She occasionally wakes up with a headache.  She snores per daughter.  She denies night to night nocturia.   She has regular  eye exams, she goes once a year.  She reports having bifocal eyeglasses.  She wears contact lenses.  She denies any blurry vision.     03/06/18: 46 year old right-handed woman with an underlying medical history of osteoarthritis, seasonal allergies, restless leg syndrome, reflux disease, depression, hyperlipidemia, vitamin D deficiency, and obesity, who reports recurrent vertigo symptoms for years. She also reports hearing loss but is unsure how long she has had it at which ear. She also reports recurrent tinnitus in both ears, unclear how long she has had it. I reviewed your office note from 12/23/2017, which you kindly included. She has had symptoms of restless leg syndrome and was started on ropinirole. She was also given a prescription for Benadryl for as needed use for her vertigo. She does not take the Benadryl any longer. She does not take the Requip any longer due to side effects. She has been on doxepin for sleep for years as I understand. She lives alone, her 62 year old daughter lives with paternal grandparents. She smokes about one carton per month, does not utilize alcohol or caffeine on a regular basis. She has not had an MRI brain. She has not had physical therapy, she has not seen ENT. She had a head CT without contrast in the past on 11/12/2009 and I reviewed the results. IMPRESSION:   No specific intracranial abnormality is identified. (Indication: headache and dizziness).   Her Past Medical History Is Significant For: Past Medical History:  Diagnosis Date   Anxiety    Bipolar disorder ([place])    Depression    Dry skin    hands   Feeling of incomplete bladder emptying    GERD (gastroesophageal reflux disease)    Hypercholesteremia    Nocturia    Sleep apnea    Urge urinary incontinence    Vitamin D deficiency    Wears contact lenses     Her Past Surgical History Is Significant For: Past Surgical History:  Procedure Laterality Date   BREAST REDUCTION SURGERY  05/10/2011    Procedure: MAMMARY REDUCTION BILATERAL (BREAST);  Surgeon: Hermelinda Dellen;  Location: [facility];  Service: Plastics;  Laterality: Bilateral;  bilateral breast reduction   DILATION AND EVACUATION  12/26/2008   x 2   INTERSTIM IMPLANT PLACEMENT N/A 07/09/2014   Procedure: Barrie Lyme IMPLANT FIRST STAGE;  Surgeon: Reece Packer, MD;  Location[facility];  Service: Urology;  Laterality: N/A;   INTERSTIM IMPLANT PLACEMENT N/A 07/09/2014   Procedure: Barrie Lyme IMPLANT SECOND STAGE;  Surgeon: Reece Packer, MD;  Location: [place];  Service: Urology;  Laterality: N/A;   INTRAUTERINE DEVICE (IUD) INSERTION  sept 2011   mirena   WISDOM TOOTH EXTRACTION  as a teenager    Her Family History Is Significant For: Family History  Adopted: Yes  Problem Relation Age of Onset   Headache Neg Hx  Migraines Neg Hx     Her Social History Is Significant For: Social History   Socioeconomic History   Marital status: Divorced    Spouse name: Not on file   Number of children: 1   Years of education: Not on file   Highest education level: Not on file  Occupational History   Occupation: disablitiy  Tobacco Use   Smoking status: Former    Packs/day: 0.25    Years: 19.00    Total pack years: 4.75    Types: Cigarettes    Quit date: 09/23/2019    Years since quitting: 2.5    Passive exposure: Past   Smokeless tobacco: Never   Tobacco comments:     1 pp3d  Vaping Use   Vaping Use: Some days  Substance and Sexual Activity   Alcohol use: Not Currently   Drug use: No   Sexual activity: Yes    Birth control/protection: None  Other Topics Concern   Not on file  Social History Narrative   Caffeine: cup coffee daily   Working: on disability    Education: 12 th grade   Live alone, one daughter.    Social Determinants of Health   Financial Resource Strain: Not on file  Food Insecurity: No Food Insecurity (01/21/2020)   Hunger Vital Sign     Worried About Running Out of Food in the Last Year: Never true    Ran Out of Food in the Last Year: Never true  Transportation Needs: No Transportation Needs (01/21/2020)   PRAPARE - Hydrologist (Medical): No    Lack of Transportation (Non-Medical): No  Physical Activity: Not on file  Stress: Not on file  Social Connections: Not on file    Her Allergies Are:  Allergies  Allergen Reactions   Geodon [Ziprasidone Hydrochloride] Shortness Of Breath and Swelling   Hibiclens [Chlorhexidine] Itching    Redness and itching after using CHG wipes   Sulfamethoxazole-Trimethoprim Other (See Comments)    hallucinations   Doxycycline Other (See Comments)    Unknown reaction   Gabapentin Other (See Comments)    Unknown reaction   Other Other (See Comments)    Bath and body works body wash and lotion caused itching, reddness and rash   Penicillins Other (See Comments)    Unknown reaction Did it involve swelling of the face/tongue/throat, SOB, or low BP? Unknown Did it involve sudden or severe rash/hives, skin peeling, or any reaction on the inside of your mouth or nose? Unknown Did you need to seek medical attention at a hospital or doctor's office? Unknown When did it last happen?  unknown     If all above answers are "NO", may proceed with cephalosporin use.   Promethazine Other (See Comments)   Depakote [Divalproex Sodium] Rash  :   Her Current Medications Are:  Outpatient Encounter Medications as of 04/19/2022  Medication Sig   amantadine (SYMMETREL) 100 MG capsule Take 100 mg by mouth daily.   lamoTRIgine (LAMICTAL) 200 MG tablet Take 400 mg by mouth daily.   metoCLOPramide (REGLAN) 10 MG tablet TAKE 1 TABLET BY MOUTH EVERY 6 HOURS AS NEEDED FOR NAUSEA FOR VOMITING   mirtazapine (REMERON) 30 MG tablet Take 30 mg by mouth at bedtime.   SUMAtriptan (IMITREX) 25 MG tablet Take 1 tablet (25 mg total) by mouth every 2 (two) hours as needed for migraine or  headache. May repeat in 2 hours if headache persists or recurs. (Patient taking differently: Take  25 mg by mouth as needed for migraine or headache. May repeat in 2 hours if headache persists or recurs.)   temazepam (RESTORIL) 15 MG capsule Take 3 capsules by mouth daily.   propranolol (INDERAL) 40 MG tablet Take 1 tablet (40 mg total) by mouth 2 (two) times daily. (Patient not taking: Reported on 02/04/2022)   No facility-administered encounter medications on file as of 04/19/2022.  :  Review of Systems:  Out of a complete 14 point review of systems, all are reviewed and negative with the exception of these symptoms as listed below:  Review of Systems  Neurological:        Pt here for headache f/u Pt states daily headaches. Pt states pain level 10+ Pt states headaches start in am and headache pain increases throughout the day Pt states no headache this am     Objective:  Neurological Exam  Physical Exam Physical Examination:   Vitals:   04/19/22 1037  BP: 114/68  Pulse: 68    General Examination: The patient is a very pleasant 46 y.o. female in no acute distress. She appears well-developed and well-nourished and well groomed.   HEENT: Normocephalic, atraumatic, pupils are equal, round and reactive to light, no photophobia.  Hearing is grossly intact.  Face is symmetric with normal facial animation.  Speech without dysarthria, airway examination reveals moderate mouth dryness, dentures on top, edentulous on the bottom.  Tongue protrudes centrally and palate elevates symmetrically.  Chest: Clear to auscultation without wheezing, rhonchi or crackles noted.   Heart: S1+S2+0, regular and normal without murmurs, rubs or gallops noted.    Abdomen: Soft, non-tender and non-distended.   Extremities: There is no obvious edema in the distal lower extremities bilaterally.   Skin: Warm and dry.   Musculoskeletal: exam reveals no obvious joint deformities.    Neurologically:  Mental  status: The patient is awake, alert and oriented in all 4 spheres. Her immediate and remote memory, attention, language skills and fund of knowledge are appropriate. There is no evidence of aphasia, agnosia, apraxia or anomia. Speech is clear with normal prosody and enunciation.  She does not provide a concise history.  She is particularly unclear about what medications she is taking. Cranial nerves II - XII are as described above under HEENT exam. Motor exam: Normal bulk, strength and tone is noted. There is no obvious tremor.  Fine motor skills and coordination: Grossly intact in the upper extremities.    Cerebellar testing: No dysmetria or intention tremor. There is no truncal or gait ataxia.  Sensory exam: intact to light touch.    Assessment and Plan:    In summary, CORBYN STEEDMAN is a 46 year old right-handed woman with an underlying medical history of vitamin D deficiency, reflux disease, hyperlipidemia, depression, and anxiety, who presents for follow-up consultation of her recurrent headaches.  She does not have telltale migraine headaches, she reports nearly daily headaches.  Is unclear if she is taking the propranolol at this time.  She initially indicated that it was not helpful and that she was not taking it but then she indicated that she does take it only at bedtime and she initially indicated that she was no longer taking the sumatriptan hand but then indicated that she would take it only as needed.  We can consider amitriptyline at low-dose with gradual titration so long as she can maybe reduce the mirtazapine, I worry about interaction and side effect with mirtazapine.  She is advised to discuss this with her  mental health provider and the possibility of tapering off the mirtazapine first before trying amitriptyline for headache prevention.  She is advised to let us know after she has had a chance to talk to Kimberly Chapel, Browning and call us back.  She is advised to follow-up with her other  providers, she typically is up-to-date with her eye examination every year.  Work-up in the form of brain MRI was benign as well as a sleep study, which did not show any significant obstructive sleep disordered breathing. I answered all her questions today and she was in agreement with our plan. I spent 30 minutes in total face-to-face time and in reviewing records during pre-charting, more than 50% of which was spent in counseling and coordination of care, reviewing test results, reviewing medications and treatment regimen and/or in discussing or reviewing the diagnosis of recurrent headaches, the prognosis and treatment options. Pertinent laboratory and imaging test results that were available during this visit with the patient were reviewed by me and considered in my medical decision making (see chart for details).

## 2022-04-19 NOTE — Progress Notes (Deleted)
    SUBJECTIVE:   CHIEF COMPLAINT / HPI:   ANABEL LYKINS is a 46 y.o. female who presents to the [facility] clinic today to discuss the following concerns:   ***  PERTINENT  PMH / PSH: Bipolar disorder, depression/anxiety   OBJECTIVE:   LMP 07/31/2020    General: NAD, pleasant, able to participate in exam Cardiac: RRR, no murmurs. Respiratory: CTAB, normal effort, No wheezes, rales or rhonchi Abdomen: Bowel sounds present, nontender, nondistended, no hepatosplenomegaly. Extremities: no edema or cyanosis. Skin: warm and dry, no rashes noted Neuro: alert, no obvious focal deficits Psych: Normal affect and mood  ASSESSMENT/PLAN:   No problem-specific Assessment & Plan notes found for this encounter.     [name], [place]

## 2022-04-19 NOTE — Patient Instructions (Addendum)
I think we need more clarification on what medications you are taking:   When you get home, please check if you are taking propranolol 40 mg twice daily.  Please also check if you are taking sumatriptan and 25 mg as needed for migraines.  We can consider a medication called amitriptyline for migraine prevention but it may have an interaction with mirtazapine.  Please check with your mental health provider, Noemi Chapel, if you are able to reduce or stop the mirtazapine before we start the amitriptyline.  Please call us back with an updated list of your medications that you are actively taking and after you have had a chance to talk to Noemi Chapel.

## 2022-05-04 ENCOUNTER — Ambulatory Visit (INDEPENDENT_AMBULATORY_CARE_PROVIDER_SITE_OTHER): Payer: Medicare Other | Admitting: Family Medicine

## 2022-05-04 ENCOUNTER — Telehealth: Payer: Self-pay

## 2022-05-04 VITALS — BP 118/62 | HR 72 | Ht 63.0 in | Wt 123.0 lb

## 2022-05-04 DIAGNOSIS — R209 Unspecified disturbances of skin sensation: Secondary | ICD-10-CM

## 2022-05-04 NOTE — Progress Notes (Signed)
    SUBJECTIVE:   CHIEF COMPLAINT / HPI:   Kimberly Browning is a 46 y.o. female who presents to the [facility] clinic today to discuss the following concerns:   Concerned about Iron Level Patient reports that she would like her iron level checked because she feels that her feet and hands are always cold and she can't seem to warm them up. She did wear her crocs today, they are her favorite shoes.   Denies any blood loss or history of anemia.  Her thyroid levels have been normal dating back to 14 years ago, most recent test was 04/2021.    PERTINENT  PMH / PSH: GERD, bipolar disorder  OBJECTIVE:   BP 118/62   Pulse 72   Ht '5\' 3"'$  (1.6 m)   Wt 123 lb (55.8 kg)   LMP 07/31/2020   SpO2 98%   BMI 21.79 kg/m    General: NAD, pleasant, able to participate in exam Cardiac: RRR, no murmurs. Respiratory: CTAB, normal effort, No wheezes, rales or rhonchi Abdomen: Bowel sounds present, nontender, non-distended Extremities: Hands and feet feel warm to touch. No edema to LE  Psych: Normal affect and mood, pressured speech   ASSESSMENT/PLAN:   1. Cold extremities Ferritin would be an out of pocket cost, estimated around $45, patient wanted to defer this test. Will check Hgb although she has not had any signs of bleeding. Denies fatigue. She has not had abnormal TSH in the past, will hold off on rechecking at this time. Exam benign and extremities were warm to the touch today. She did wear crocks today without socks, advised that this is likely not helping the situation since the weather is cold outside!  - [place], [place]

## 2022-05-04 NOTE — Telephone Encounter (Signed)
Patient calls nurse line to follow up on today's visit. She states that she googled symptoms of anemia and has concerns because of her symptoms.   Patient reports that she is having the following symptoms:  - Reports that she gets tired easily -Feels like she has to take "deeper breaths than normal"  - Cold hands  Patient does not currently have CP or SHOB.   Advised patient that I would forward message to PCP and that we would let her know when her lab work came back.   ED precautions discussed.   Talbot Grumbling, RN

## 2022-05-04 NOTE — Patient Instructions (Signed)
It was wonderful to see you today.  Please bring ALL of your medications with you to every visit.   Today we talked about:  We are doing lab work today to check your hemoglobin level. I will send you a MyChart message if you have MyChart. Otherwise, I will give you a call for abnormal results or send a letter if everything returned back normal. If you don't hear from me in 2 weeks, please call the office.    Thank you for coming to your visit as scheduled. We have had a large "no-show" problem lately, and this significantly limits our ability to see and care for patients. As a friendly reminder- if you cannot make your appointment please call to cancel. We do have a no show policy for those who do not cancel within 24 hours. Our policy is that if you miss or fail to cancel an appointment within 24 hours, 3 times in a 32-monthperiod, you may be dismissed from our clinic.   Thank you for choosing [facility]   Please call [phone] any questions about today's appointment.  Please be sure to schedule follow up at the front  desk before you leave today.   ASharion Settler DO PGY-3 Family Medicine

## 2022-05-05 LAB — CBC
Hematocrit: 41.6 % (ref 34.0–46.6)
Hemoglobin: 14 g/dL (ref 11.1–15.9)
MCH: 28.5 pg (ref 26.6–33.0)
MCHC: 33.7 g/dL (ref 31.5–35.7)
MCV: 85 fL (ref 79–97)
Platelets: 278 10*3/uL (ref 150–450)
RBC: 4.91 x10E6/uL (ref 3.77–5.28)
RDW: 12.8 % (ref 11.7–15.4)
WBC: 6.6 10*3/uL (ref 3.4–10.8)

## 2022-05-05 NOTE — Telephone Encounter (Signed)
Patient calls nurse line regarding normal results. She is asking to speak with provider as she "does not see how her results could possibly be normal."   Please return call to patient at [phone].  Talbot Grumbling, RN

## 2022-05-06 NOTE — Telephone Encounter (Signed)
Called patient to discuss.

## 2022-05-06 NOTE — Telephone Encounter (Signed)
Called patient but she did not answer.

## 2022-05-06 NOTE — Telephone Encounter (Signed)
Patient returns call to nurse line.   Will forward back to PCP.   She reports she was asleep when she called. Patient advised to keep her phone on her.

## 2022-05-11 NOTE — Telephone Encounter (Signed)
Patient returns call to nurse line. She is still concerned about her continued cold hands. She feels like it is constant and she is never able to get hands warm.   She states that she does not know what to do next.  Please advise how patient should proceed.   Talbot Grumbling, RN

## 2022-05-12 NOTE — Telephone Encounter (Signed)
Called patient to schedule appointment. She did not answer. LVM asking patient to return call to office to schedule.   Talbot Grumbling, RN

## 2022-05-13 ENCOUNTER — Ambulatory Visit: Payer: Medicare Other | Admitting: Neurology

## 2022-05-19 NOTE — Progress Notes (Deleted)
    SUBJECTIVE:   CHIEF COMPLAINT / HPI:   Kimberly Browning is a 46 y.o. female who presents to the [facility] clinic today to discuss the following concerns:   Vaginal Itching    PERTINENT  PMH / PSH: Bipolar disorder, depression with anxiety, GERD  OBJECTIVE:   LMP 07/31/2020  ***  General: NAD, pleasant, able to participate in exam Respiratory: normal effort GU: Normal appearance of labia majora and minora, without lesions. Vagina tissue pink, moist, without lesions or abrasions. Cervix normal appearance, non-friable, without discharge from os.  Skin: warm and dry, no rashes noted Psych: Normal affect and mood  GU exam chaperoned by *** CMA   ASSESSMENT/PLAN:   No problem-specific Assessment & Plan notes found for this encounter.     [name], [place]

## 2022-05-19 NOTE — Patient Instructions (Incomplete)
It was wonderful to see you today.  Please bring ALL of your medications with you to every visit.   Today we talked about:  **  Thank you for coming to your visit as scheduled. We have had a large "no-show" problem lately, and this significantly limits our ability to see and care for patients. As a friendly reminder- if you cannot make your appointment please call to cancel. We do have a no show policy for those who do not cancel within 24 hours. Our policy is that if you miss or fail to cancel an appointment within 24 hours, 3 times in a 6-month period, you may be dismissed from our clinic.   Thank you for choosing [facility].   Please call [phone] with any questions about today's appointment.  Please be sure to schedule follow up at the front  desk before you leave today.   Cassidy Tabet, DO PGY-3 Family Medicine   

## 2022-05-20 ENCOUNTER — Ambulatory Visit: Payer: Medicare Other | Admitting: Family Medicine

## 2022-05-26 ENCOUNTER — Encounter: Payer: Self-pay | Admitting: Family Medicine

## 2022-05-26 ENCOUNTER — Ambulatory Visit (INDEPENDENT_AMBULATORY_CARE_PROVIDER_SITE_OTHER): Payer: Medicare Other | Admitting: Family Medicine

## 2022-05-26 VITALS — BP 100/70 | HR 72 | Ht 63.0 in | Wt 119.2 lb

## 2022-05-26 DIAGNOSIS — N898 Other specified noninflammatory disorders of vagina: Secondary | ICD-10-CM

## 2022-05-26 DIAGNOSIS — L600 Ingrowing nail: Secondary | ICD-10-CM | POA: Insufficient documentation

## 2022-05-26 DIAGNOSIS — R300 Dysuria: Secondary | ICD-10-CM | POA: Diagnosis not present

## 2022-05-26 LAB — POCT URINALYSIS DIP (MANUAL ENTRY)
Blood, UA: NEGATIVE
Glucose, UA: NEGATIVE mg/dL
Nitrite, UA: NEGATIVE
Protein Ur, POC: 30 mg/dL — AB
Spec Grav, UA: 1.025 (ref 1.010–1.025)
Urobilinogen, UA: 0.2 E.U./dL
pH, UA: 5.5 (ref 5.0–8.0)

## 2022-05-26 LAB — POCT WET PREP (WET MOUNT)
Clue Cells Wet Prep Whiff POC: NEGATIVE
Trichomonas Wet Prep HPF POC: ABSENT
WBC, Wet Prep HPF POC: >20

## 2022-05-26 NOTE — Assessment & Plan Note (Signed)
Normal GU exam.  Wet prep was negative for BV, yeast, trichomoniasis.  She is not sexually active and denied other STI tests. Unclear etiology at this time. Will monitor.

## 2022-05-26 NOTE — Patient Instructions (Addendum)
It was wonderful to see you today.  Today we talked about:  We did a wet prep today. I will let you know if the result is abnormal.  I don't think that we have to send your urine off for urine culture since you are not having very many urinary symptoms. I will see you back again for toe nail removal.   Thank you for coming to your visit as scheduled. We have had a large "no-show" problem lately, and this significantly limits our ability to see and care for patients. As a friendly reminder- if you cannot make your appointment please call to cancel. We do have a no show policy for those who do not cancel within 24 hours. Our policy is that if you miss or fail to cancel an appointment within 24 hours, 3 times in a 4-monthperiod, you may be dismissed from our clinic.   Thank you for choosing [facility]   Please call [phone] any questions about today's appointment.  Please be sure to schedule follow up at the front  desk before you leave today.   ASharion Settler DO PGY-3 Family Medicine

## 2022-05-26 NOTE — Progress Notes (Signed)
    SUBJECTIVE:   CHIEF COMPLAINT / HPI:   Kimberly Browning is a 46 y.o. female who presents to the [facility] clinic today to discuss the following concerns:   Vaginal Itching/Burning Reports ongoing for several days. She doesn't endorse any vaginal discharge. No recent antibiotics. No intercourse x2 years. No fevers. No dysuria. She does endorse urinary frequency but not urgency. Intermittent nausea without vomiting. Pap smear is UTD.   PERTINENT  PMH / PSH: GERD, bipolar disorder   OBJECTIVE:   BP 100/70   Pulse 72   Ht '5\' 3"'$  (1.6 m)   Wt 119 lb 3.2 oz (54.1 kg)   LMP 07/31/2020   BMI 21.12 kg/m    General: NAD, pleasant, able to participate in exam Respiratory: normal effort GU: Normal appearance of labia majora and minora, without lesions. Vagina tissue pink, moist, without lesions or abrasions. Cervix normal appearance, non-friable, with discharge from os.  Left Foot:  Great toe with ingrown toenail bilaterally, slightly discolored and thickened toenail  Psych: Normal affect and mood  GU exam chaperoned by Mercer Pod, CMA  ASSESSMENT/PLAN:   Vaginal itching Normal GU exam.  Wet prep was negative for BV, yeast, trichomoniasis.  She is not sexually active and denied other STI tests. Unclear etiology at this time. Will monitor.  Ingrown toenail without infection Left great toenail is ingrown bilaterally.  Also slightly discolored and thickened.  Patient would like toenail removed.  I have scheduled her for an appointment with me in January.      [name], [place]

## 2022-05-26 NOTE — Assessment & Plan Note (Signed)
Left great toenail is ingrown bilaterally.  Also slightly discolored and thickened.  Patient would like toenail removed.  I have scheduled her for an appointment with me in January.

## 2022-06-24 ENCOUNTER — Ambulatory Visit: Payer: Medicare Other | Admitting: Family Medicine

## 2022-06-24 ENCOUNTER — Ambulatory Visit (INDEPENDENT_AMBULATORY_CARE_PROVIDER_SITE_OTHER): Payer: Medicare Other | Admitting: Family Medicine

## 2022-06-24 VITALS — BP 113/72 | HR 72 | Ht 63.0 in | Wt 118.4 lb

## 2022-06-24 DIAGNOSIS — L600 Ingrowing nail: Secondary | ICD-10-CM

## 2022-06-24 DIAGNOSIS — B351 Tinea unguium: Secondary | ICD-10-CM

## 2022-06-24 NOTE — Progress Notes (Deleted)
Subjective:  Patient ID: Kimberly Browning, female    DOB: 1975-12-17,  MRN: [id number]  47 y.o. female presents for elective toenail removal for onychomycosis.    Objective:  There were no vitals filed for this visit. There is no height or weight on file to calculate BMI. Constitutional Well developed. Well nourished.  Vascular Dorsalis pedis pulses palpable bilaterally. Posterior tibial pulses palpable bilaterally. Capillary refill normal to all digits.  No cyanosis or clubbing noted. Pedal hair growth normal.  Dermatologic Painful ingrowing nail at {Anatomy; malleolus lateral/medial:19558} nail borders of the hallux nail {Left/right:33004}. No other open wounds. No skin lesions.  Orthopedic: Normal joint ROM without pain or crepitus bilaterally. No visible deformities. No bony tenderness.   Assessment:  No diagnosis found. Plan:  Patient was evaluated and treated and all questions answered.  Ingrown Nail, {Left/right:33004} -Patient elects to proceed with minor surgery to remove ingrown toenail removal today. Consent reviewed and signed by patient. -Ingrown nail excised. See procedure note. -Educated on post-procedure care including soaking. Written instructions provided and reviewed. -Patient to follow up in 2 weeks for nail check.  Procedure: Excision of Ingrown Toenail Location: {Right/Left:3041628} {Anatomy; location toes:60196} {Anatomy; malleolus lateral/medial:19558} nail borders. Anesthesia: Lidocaine 1% plain; 1.5 mL and Marcaine 0.5% plain; 1.5 mL, digital block. Skin Prep: Betadine. Dressing: Silvadene; telfa; dry, sterile, compression dressing. Technique: Following skin prep, the toe was exsanguinated and a tourniquet was secured at the base of the toe. The affected nail border was freed, split with a nail splitter, and excised. Chemical matrixectomy was then performed with phenol and irrigated out with alcohol. The tourniquet was then removed and sterile dressing  applied. Disposition: Patient tolerated procedure well. Patient to return in 2 weeks for follow-up.   [name] PGY-3 Family Medicine

## 2022-06-24 NOTE — Assessment & Plan Note (Signed)
Possible coincident fungal infection based on thickening and keratosis of nail. Treat ingrowth and possible infection with removal below. Follow-up precautions and after care discussed.

## 2022-06-24 NOTE — Patient Instructions (Signed)
It was great to see you! Thank you for allowing me to participate in your care!  I recommend that you always bring your medications to each appointment as this makes it easy to ensure we are on the correct medications and helps [place] not miss when refills are needed.  Our plans for today:  - I have removed your left big toenail today. Please refer to the sheet below on how to take care of it. Please see you PCP for any other concerns.   Please arrive 15 minutes PRIOR to your next scheduled appointment time! If you do not, this affects OTHER patients' care.  Take care and seek immediate care sooner if you develop any concerns.   Dr. Salvadore Oxford, MD [place]

## 2022-06-24 NOTE — Patient Instructions (Incomplete)
It was wonderful to see you today.  Please bring ALL of your medications with you to every visit.   Today we talked about:  **  Thank you for coming to your visit as scheduled. We have had a large "no-show" problem lately, and this significantly limits our ability to see and care for patients. As a friendly reminder- if you cannot make your appointment please call to cancel. We do have a no show policy for those who do not cancel within 24 hours. Our policy is that if you miss or fail to cancel an appointment within 24 hours, 3 times in a 6-month period, you may be dismissed from our clinic.   Thank you for choosing [facility].   Please call [phone] with any questions about today's appointment.  Please be sure to schedule follow up at the front  desk before you leave today.   Aloria Looper, DO PGY-3 Family Medicine   

## 2022-06-24 NOTE — Progress Notes (Signed)
    SUBJECTIVE:   CHIEF COMPLAINT / HPI: toenail removal referral  Here by referral from Dr. Nita Sells for left toenail removal due to bilateral sides of left toenail ingrown.  Patient has no new complaints. States she would like the whole nail removed. Has this done before. Still causing significant pain.  PERTINENT  PMH / PSH: Prior toenail remove all.  OBJECTIVE:   BP 113/72   Pulse 72   Ht '5\' 3"'$  (1.6 m)   Wt 118 lb 6.4 oz (53.7 kg)   LMP 07/31/2020   SpO2 100%   BMI 20.97 kg/m   General: well-appearing female sitting in clinic chair Derm: Left toenail - thickened, keratotic, bilateral edges ingrown, mild erythema and swelling, no drainage  ASSESSMENT/PLAN:   Ingrown toenail without infection Assessment & Plan: Possible coincident fungal infection based on thickening and keratosis of nail. Treat ingrowth and possible infection with removal below. Follow-up precautions and after care discussed.    Ingrown nail, no infection.  Patient given informed consent, signed copy in the chart. Appropriate time out taken.  Large rubber band with forceps used as tourniquet at base of the toe.  Digital block done using 2% lidocaine without epinephrine, 5cc total.  Area prepped and draped in usual sterile fashion. Nail elevated using nail elevator, removed using hemostats and traction force.  Nail bed was ablated using curettage and phenol followed by copious alcohol wash.  No complications.  Minimal bleeding.  Sterile bandage applied and post procedure instructions given.  Patient tolerated procedure well without complications.    Return if symptoms worsen or fail to improve.  Salvadore Oxford, MD [place]

## 2022-06-29 ENCOUNTER — Telehealth: Payer: Self-pay

## 2022-06-29 NOTE — Telephone Encounter (Signed)
Patient calls nurse line due to concerns with toe. She had toenail removed last Wednesday. She states that when she "presses on toe" the area feels numb.   States that area looks "bloody all around it." Reports that area is also painful. She denies fever, reports that she is always cold.   Recommended that she schedule appointment to have area looked at. Patient states that she has "no clue" when she would like to schedule. She will call back when she is able to schedule appointment.   Supportive measures discussed.   Talbot Grumbling, RN

## 2022-07-01 ENCOUNTER — Ambulatory Visit (INDEPENDENT_AMBULATORY_CARE_PROVIDER_SITE_OTHER): Payer: Medicare Other | Admitting: Family Medicine

## 2022-07-01 ENCOUNTER — Ambulatory Visit: Payer: Medicare Other | Admitting: Family Medicine

## 2022-07-01 VITALS — BP 110/72 | HR 91 | Ht 63.0 in | Wt 117.4 lb

## 2022-07-01 DIAGNOSIS — L089 Local infection of the skin and subcutaneous tissue, unspecified: Secondary | ICD-10-CM

## 2022-07-01 MED ORDER — CLINDAMYCIN HCL 300 MG PO CAPS: 300.0000 mg | ORAL_CAPSULE | Freq: Three times a day (TID) | ORAL | 0 refills | Status: AC

## 2022-07-01 NOTE — Patient Instructions (Addendum)
It was nice seeing you today!  Take antibiotics as prescribed 3 times a day.  You can continue taking Advil as needed for pain.  Stay well, Zola Button, MD [place] [phone]  --  Make sure to check out at the front desk before you leave today.  Please arrive at least 15 minutes prior to your scheduled appointments.  If you had blood work today, I will send you a MyChart message or a letter if results are normal. Otherwise, I will give you a call.  If you had a referral placed, they will call you to set up an appointment. Please give [place] a call if you don't hear back in the next 2 weeks.  If you need additional refills before your next appointment, please call your pharmacy first.

## 2022-07-01 NOTE — Progress Notes (Signed)
    SUBJECTIVE:   CHIEF COMPLAINT / HPI:  Chief Complaint  Patient presents with   Toe Pain    Patient recently underwent partial nail removal 1 week ago, called the nurse line with numb sensation to the toe and pain.  Patient reports that she has had persistent pain in the left great toe since the toenail removal starting the following day.  She has noticed some redness to the area.  Unsure if there is been any drainage.  She has been walking some but try to minimize walking.  She has been taking Advil as needed with minimal relief.  Denies fever, chills.  PERTINENT  PMH / PSH: Ingrown toenail underwent partial nail removal 06/24/2022  Patient Care Team: [name], DO as PCP - General (Family Medicine) Fay Records, MD as PCP - Cardiology (Cardiology)   OBJECTIVE:   BP 110/72   Pulse 91   Ht '5\' 3"'$  (1.6 m)   Wt 117 lb 6.4 oz (53.3 kg)   LMP 07/31/2020   SpO2 99%   BMI 20.80 kg/m   Physical Exam Constitutional:      General: She is not in acute distress. Cardiovascular:     Rate and Rhythm: Normal rate.     Pulses:          Dorsalis pedis pulses are 2+ on the left side.       Posterior tibial pulses are 2+ on the left side.  Pulmonary:     Effort: Pulmonary effort is normal. No respiratory distress.  Musculoskeletal:     Cervical back: Neck supple.     Comments: Left great toe with notable swelling and erythema compared to the right.  Tenderness to palpation.  There is a scant amount of thick serosanguineous drainage.  Diminished ROM of the toe secondary to swelling and pain.  Neurological:     Mental Status: She is alert.          {Show previous vital signs (optional):23777}    ASSESSMENT/PLAN:   Toe infection  I think patient unfortunately has a infection after recent toenail removal 1 week ago given erythema, swelling, and possible purulent drainage. - clindamycin TID x 7d (adverse reaction to doxycycline and TMP-SMX)  Return in about 1 week  (around 07/08/2022) for f/u toe infection.   Zola Button, MD [place]

## 2022-07-09 ENCOUNTER — Ambulatory Visit (INDEPENDENT_AMBULATORY_CARE_PROVIDER_SITE_OTHER): Payer: Medicare Other | Admitting: Family Medicine

## 2022-07-09 ENCOUNTER — Encounter: Payer: Self-pay | Admitting: Family Medicine

## 2022-07-09 VITALS — BP 98/66 | HR 73 | Ht 63.0 in | Wt 117.4 lb

## 2022-07-09 DIAGNOSIS — M79675 Pain in left toe(s): Secondary | ICD-10-CM | POA: Diagnosis present

## 2022-07-09 NOTE — Patient Instructions (Addendum)
It was wonderful to see you today. HAPPY BIRTHDAY!  Today we talked about:  Use the Bacitracin at night. Use water and gently wash it off in the morning. I will see you back in a week.  I do think it is starting to heal! It will take time.   Thank you for coming to your visit as scheduled. We have had a large "no-show" problem lately, and this significantly limits our ability to see and care for patients. As a friendly reminder- if you cannot make your appointment please call to cancel. We do have a no show policy for those who do not cancel within 24 hours. Our policy is that if you miss or fail to cancel an appointment within 24 hours, 3 times in a 17-monthperiod, you may be dismissed from our clinic.   Thank you for choosing [facility]   Please call [phone] any questions about today's appointment.  Please be sure to schedule follow up at the front  desk before you leave today.   ASharion Settler DO PGY-3 Family Medicine

## 2022-07-09 NOTE — Progress Notes (Signed)
    SUBJECTIVE:   CHIEF COMPLAINT / HPI:   Kimberly Browning is a 47 y.o. female who presents to the [facility] clinic today to discuss the following concerns:   Follow Up Patient was last seen on 1/25 by Dr. Nancy Fetter for persistent pain to her left great toe since toenail removal. She was previously taking Advil for the discomfort but it wasn't really helping. She was thought to have an infection of her toe and was given a 7 day prescription of clindamycin TID. She returns today for follow up.   She feels like since her last visit the swelling has persisted. It is hard for her to bend her toe.   PERTINENT  PMH / PSH: Bipolar disorder,  GERD, depression   OBJECTIVE:   BP 98/66   Pulse 73   Ht '5\' 3"'$  (1.6 m)   Wt 117 lb 6 oz (53.2 kg)   LMP 07/31/2020   SpO2 100%   BMI 20.79 kg/m    General: NAD, pleasant, able to participate in exam Respiratory: normal effort Skin: Great left toe (see below) with dry and crusted tissue that is dark brown in color. Has some dried blood to the medial aspect, no active bleeding. Dry skin around nail. Erythema appears improved compared to previous picture Psych: Normal affect and mood    ASSESSMENT/PLAN:   1. Toe pain, left Great toe. Continues to have pain s/p removal. Able to ambulate today. She has completed her antibiotics, and the infection seems cleared at this point. It appears to be in the healing process. She was provided with bacitracin samples and advised to use these at night and to wash off gently with water in the morning. Recommended f/u in 2 weeks but patient specifically requests 1:30 PM appointment so I will see her in 1 week for f/u.    [name], [place]

## 2022-07-15 ENCOUNTER — Ambulatory Visit: Payer: Self-pay | Admitting: Family Medicine

## 2022-07-15 NOTE — Patient Instructions (Incomplete)
It was wonderful to see you today.  Please bring ALL of your medications with you to every visit.   Today we talked about:  **  Thank you for coming to your visit as scheduled. We have had a large "no-show" problem lately, and this significantly limits our ability to see and care for patients. As a friendly reminder- if you cannot make your appointment please call to cancel. We do have a no show policy for those who do not cancel within 24 hours. Our policy is that if you miss or fail to cancel an appointment within 24 hours, 3 times in a 6-month period, you may be dismissed from our clinic.   Thank you for choosing [facility].   Please call [phone] with any questions about today's appointment.  Please be sure to schedule follow up at the front  desk before you leave today.   Mykela Mewborn, DO PGY-3 Family Medicine   

## 2022-07-15 NOTE — Progress Notes (Deleted)
    SUBJECTIVE:   CHIEF COMPLAINT / HPI:   Kimberly Browning is a 47 y.o. female who presents to the [facility] clinic today to discuss the following concerns:   F/u Left Great Toe s/p Toenail removal  Patient presents today to follow-up on her left great toe.  She had it removed on 1/18 for onychomycosis.  This is then complicated with the toenail infection, she has completed a course of clindamycin and I saw her last on 2/2.  Her toes seem to be improved at that time.  She was advised to use bacitracin to her toe at night and wash it off gently in the morning.  She returns today for follow-up.  PERTINENT  PMH / PSH: ***  OBJECTIVE:   LMP 07/31/2020    General: NAD, pleasant, able to participate in exam Cardiac: RRR, no murmurs. Respiratory: CTAB, normal effort, No wheezes, rales or rhonchi Abdomen: Bowel sounds present, nontender, nondistended, no hepatosplenomegaly. Extremities: no edema or cyanosis. Skin: warm and dry, no rashes noted Neuro: alert, no obvious focal deficits Psych: Normal affect and mood  ASSESSMENT/PLAN:   No problem-specific Assessment & Plan notes found for this encounter.     [name], [place]

## 2022-07-16 ENCOUNTER — Encounter: Payer: Self-pay | Admitting: Family Medicine

## 2022-07-16 ENCOUNTER — Ambulatory Visit (INDEPENDENT_AMBULATORY_CARE_PROVIDER_SITE_OTHER): Payer: Medicare Other | Admitting: Family Medicine

## 2022-07-16 VITALS — BP 118/70 | HR 62 | Ht 63.0 in | Wt 117.4 lb

## 2022-07-16 DIAGNOSIS — L089 Local infection of the skin and subcutaneous tissue, unspecified: Secondary | ICD-10-CM | POA: Diagnosis not present

## 2022-07-16 DIAGNOSIS — L853 Xerosis cutis: Secondary | ICD-10-CM

## 2022-07-16 MED ORDER — MUPIROCIN 2 % EX OINT: 1.0000 | TOPICAL_OINTMENT | Freq: Two times a day (BID) | CUTANEOUS | 0 refills | Status: AC

## 2022-07-16 NOTE — Patient Instructions (Addendum)
It was wonderful to see you today.  Please bring ALL of your medications with you to every visit.   Today we talked about:  Overall, your toe looks like it is healing well.  We would recommend another week of a topical antibiotic called mupirocin. Use this twice daily.   For your dry skin, I would recommend Amlactin. You can buy this over the counter.    Return to see [place] in 1 week.   Thank you for coming to your visit as scheduled. We have had a large "no-show" problem lately, and this significantly limits our ability to see and care for patients. As a friendly reminder- if you cannot make your appointment please call to cancel. We do have a no show policy for those who do not cancel within 24 hours. Our policy is that if you miss or fail to cancel an appointment within 24 hours, 3 times in a 65-monthperiod, you may be dismissed from our clinic.   Thank you for choosing [facility]   Please call [phone] any questions about today's appointment.  Please be sure to schedule follow up at the front  desk before you leave today.   ASharion Settler DO PGY-3 Family Medicine

## 2022-07-16 NOTE — Progress Notes (Signed)
    SUBJECTIVE:   CHIEF COMPLAINT / HPI:   Kimberly Browning is a 47 y.o. female who presents to the [facility] clinic today to discuss the following concerns:   F/u Left Great Toe s/p Toenail removal  Patient presents today to follow-up on her left great toe.  She had it removed on 1/18 for onychomycosis.  This was then complicated with the toenail bed infection, she has completed a course of clindamycin and I saw her last on 2/2 for follow up.  Her toe seem to be improved at that time.  She was advised to use bacitracin to her toe at night and wash it off gently in the morning.  She returns today for follow-up.  She reports good adherence to the bacitracin.  She still notes difficulty in bending her toe and feels that her gait is abnormal.  She also notes dry skin to the soles of her feet.  PERTINENT  PMH / PSH: Not pertinent   OBJECTIVE:   BP 118/70   Pulse 62   Ht '5\' 3"'$  (1.6 m)   Wt 117 lb 6.4 oz (53.3 kg)   LMP 07/31/2020   SpO2 100%   BMI 20.80 kg/m    General: NAD, pleasant, able to participate in exam Respiratory: normal effort Left Great Toe: warm and dry. Erythematous great toe but not warm to touch. Not edematous. Does not seem tender to palpation. Toenail bed with yellow crusting.  Skin: Very dry soles of feet bilaterally with peeling skin MSK: Normal gait appreciated Psych: Normal affect and mood    ASSESSMENT/PLAN:   1. Toe infection Overall seems to be healing well. Will switch to another topical abx and follow up in 1 week to monitor.  - mupirocin ointment (BACTROBAN) 2 %; Apply 1 Application topically 2 (two) times daily for 7 days.  Dispense: 14 g; Refill: 0  2. Dry skin Especially noted at soles of feet. Recommend using AmLactin to help with gentle exfoliation and skin hydration.    [name], [name]

## 2022-07-19 ENCOUNTER — Telehealth: Payer: Self-pay

## 2022-07-19 NOTE — Telephone Encounter (Signed)
Patient LVM on nurse line requesting to speak with PCP.   She reports she can not remember if she is supposed to put the ointment on her toe and then place a sock over it? Or if she is supposed to put the ointment on and "let it be?"    Will forward to PCP.   She has a FU scheduled for 2/16.

## 2022-07-20 ENCOUNTER — Encounter: Payer: Self-pay | Admitting: Family Medicine

## 2022-07-20 NOTE — Telephone Encounter (Signed)
Patient calls back with same question.  Advised that Dr. Nita Sells is in clinic this afternoon.   In the meantime, advised that she should apply ointment and keep uncovered as long as she is sitting around the house. If se goes out then she should cover it.  If that is incorrect information or we need to tell her more advised that someone would give her a call back. Christen Bame, CMA

## 2022-07-20 NOTE — Telephone Encounter (Signed)
Sent MyChart message to patient

## 2022-07-23 ENCOUNTER — Ambulatory Visit: Payer: Self-pay | Admitting: Family Medicine

## 2022-07-23 NOTE — Progress Notes (Deleted)
    SUBJECTIVE:   CHIEF COMPLAINT / HPI:   Kimberly Browning is a 47 y.o. female who presents to the [facility] clinic today to discuss the following concerns:   F/u Left Great Toe s/p Toenail Removal Patient presents again today for follow up on her toe. She was last seen on 2/9 and advised to use Mupirocin ointment to her toe. She previously completed a course of clindamycin as well.    PERTINENT  PMH / PSH: BPD   OBJECTIVE:   LMP 07/31/2020    General: NAD, pleasant, able to participate in exam Cardiac: RRR, no murmurs. Respiratory: CTAB, normal effort, No wheezes, rales or rhonchi Abdomen: Bowel sounds present, nontender, nondistended, no hepatosplenomegaly. Extremities: no edema or cyanosis. Skin: warm and dry, no rashes noted Neuro: alert, no obvious focal deficits Psych: Normal affect and mood  ASSESSMENT/PLAN:   No problem-specific Assessment & Plan notes found for this encounter.     [name], [place]

## 2022-07-23 NOTE — Patient Instructions (Incomplete)
It was wonderful to see you today.  Please bring ALL of your medications with you to every visit.   Today we talked about:  **  Thank you for coming to your visit as scheduled. We have had a large "no-show" problem lately, and this significantly limits our ability to see and care for patients. As a friendly reminder- if you cannot make your appointment please call to cancel. We do have a no show policy for those who do not cancel within 24 hours. Our policy is that if you miss or fail to cancel an appointment within 24 hours, 3 times in a 6-month period, you may be dismissed from our clinic.   Thank you for choosing [facility].   Please call [phone] with any questions about today's appointment.  Please be sure to schedule follow up at the front  desk before you leave today.   Tekeya Geffert, DO PGY-3 Family Medicine   

## 2022-07-28 ENCOUNTER — Telehealth: Payer: Self-pay

## 2022-07-28 DIAGNOSIS — L600 Ingrowing nail: Secondary | ICD-10-CM

## 2022-07-28 NOTE — Telephone Encounter (Signed)
Patient calls nurse line requesting referral to podiatry for dry feet.   She was seen on 2/9 and states that she discussed this with provider.   Will forward to PCP.   Talbot Grumbling, RN

## 2022-07-29 NOTE — Telephone Encounter (Signed)
Patient returns call to nurse line. Advised of podiatry referral contact information.   Patient appreciative.   Talbot Grumbling, RN

## 2022-08-10 ENCOUNTER — Ambulatory Visit: Payer: Medicare Other | Admitting: Podiatry

## 2022-08-17 ENCOUNTER — Ambulatory Visit: Payer: Medicare Other | Admitting: Podiatry

## 2022-08-19 ENCOUNTER — Ambulatory Visit: Payer: Medicare Other | Admitting: Podiatry

## 2022-08-31 ENCOUNTER — Ambulatory Visit (INDEPENDENT_AMBULATORY_CARE_PROVIDER_SITE_OTHER): Payer: Medicare Other | Admitting: Podiatry

## 2022-08-31 DIAGNOSIS — B353 Tinea pedis: Secondary | ICD-10-CM | POA: Diagnosis not present

## 2022-08-31 DIAGNOSIS — L853 Xerosis cutis: Secondary | ICD-10-CM | POA: Diagnosis not present

## 2022-08-31 MED ORDER — KETOCONAZOLE 2 % EX CREA: 1.0000 | TOPICAL_CREAM | Freq: Every day | CUTANEOUS | 2 refills | Status: DC

## 2022-08-31 MED ORDER — AMMONIUM LACTATE 12 % EX CREA: 1.0000 | TOPICAL_CREAM | CUTANEOUS | 0 refills | Status: DC | PRN

## 2022-08-31 NOTE — Progress Notes (Signed)
  Subjective:  Patient ID: Kimberly Browning, female    DOB: 1975-11-22,  MRN: [id number]  Chief Complaint  Patient presents with   Tinea Pedis    np bilateral dry skin on  feet ( left worse) has tried alot of things otc    47 y.o. female presents with the above complaint. History confirmed with patient.  She has had dry skin and scaling on both feet, also had part of an ingrown nail removed recently.  Objective:  Physical Exam: warm, good capillary refill, no trophic changes or ulcerative lesions, normal DP and PT pulses, normal sensory exam, and she has some evidence of tinea pedis with dry scaling skin, no interdigital maceration, xerosis cutis and healing nail avulsion site on left hallux.  Assessment:   1. Tinea pedis of both feet   2. Xerosis cutis      Plan:  Patient was evaluated and treated and all questions answered.  Discussed the etiology and treatment options for tinea pedis.  Discussed topical and oral treatment.  Recommended topical treatment with 2% ketoconazole cream.  This was sent to the patient's pharmacy.  Also discussed appropriate foot hygiene, use of antifungal spray such as Tinactin in shoes, as well as cleaning her foot surfaces such as showers and bathroom floors with bleach.  I also recommended utilizing ammonium lactate cream for excellent effect.  I evaluated her left great toenail that was treated for ingrown toenail and appears to be healing well I discussed with her this is a normal appearance 1 month out.  She will return to see me as needed.   Return if symptoms worsen or fail to improve.

## 2022-09-06 ENCOUNTER — Telehealth: Payer: Self-pay | Admitting: Podiatry

## 2022-09-06 NOTE — Telephone Encounter (Signed)
Pt states she is experiencing a lot of itching when using the prescribed mediations  on her feet. Pt wants to know if there is a different cream she can use.  ammonium lactate (AMLACTIN) 12 % cream   ketoconazole (NIZORAL) 2 % cream   [place] ([place]), [place] - [address]   Please advise.

## 2022-09-07 MED ORDER — BETAMETHASONE DIPROPIONATE 0.05 % EX CREA: TOPICAL_CREAM | Freq: Two times a day (BID) | CUTANEOUS | 0 refills | Status: DC

## 2022-09-07 NOTE — Telephone Encounter (Signed)
Patient states that she has stopped both medications and she is still itching. Patient states that she can't take benadryl for the itching and rash due to it makes her stomach upset.

## 2022-09-07 NOTE — Addendum Note (Signed)
Addended bySherryle Lis, Erving Sassano R on: 09/07/2022 04:57 PM   Modules accepted: Orders

## 2022-09-08 ENCOUNTER — Telehealth: Payer: Self-pay | Admitting: *Deleted

## 2022-09-08 NOTE — Telephone Encounter (Signed)
Patient is calling to make sure that her new prescription she picked up is the replacement for the creams that were causing her problems, gave her the name and she verbalized understanding that it was the one she picked up today.

## 2022-09-08 NOTE — Telephone Encounter (Signed)
Attempted to contact the patient, no answer and no voicemail set up.

## 2022-09-14 ENCOUNTER — Telehealth: Payer: Self-pay | Admitting: Podiatry

## 2022-09-14 NOTE — Telephone Encounter (Signed)
Pt stated that the cream is not working, feet are feel rough on the bottom, and she can't wear socks when she uses the cream. The socks are taking the cream off. Please advise

## 2022-09-16 ENCOUNTER — Telehealth: Payer: Self-pay | Admitting: Podiatry

## 2022-09-16 DIAGNOSIS — L853 Xerosis cutis: Secondary | ICD-10-CM

## 2022-09-16 DIAGNOSIS — B353 Tinea pedis: Secondary | ICD-10-CM

## 2022-09-16 NOTE — Telephone Encounter (Signed)
Patient called and stated that the medication  betamethasone dipropionate 0.05 % cream  is not helping her heels. She doesn't like to wear socks because they irritate her feet. She would like to know what to do.  Please advise

## 2022-09-17 NOTE — Telephone Encounter (Signed)
Referral packet was faxed over today on 09/17/22.

## 2022-09-20 ENCOUNTER — Telehealth: Payer: Self-pay | Admitting: Podiatry

## 2022-09-20 NOTE — Telephone Encounter (Signed)
Pt called back and stated that the cream isnt working. Pt was sent to Nurse triage to get medical advise

## 2022-09-21 NOTE — Telephone Encounter (Signed)
Patient has been scheduled 09/08/23, called [facility],their office are booking out a year as of now, patient will be placed on cancellation list as well.

## 2022-10-27 ENCOUNTER — Encounter (HOSPITAL_COMMUNITY): Payer: Self-pay

## 2022-10-27 ENCOUNTER — Ambulatory Visit (HOSPITAL_COMMUNITY)
Admission: EM | Admit: 2022-10-27 | Discharge: 2022-10-27 | Disposition: A | Discharge status: Home / Self Care | Payer: Medicare Other | Attending: Emergency Medicine | Admitting: Emergency Medicine

## 2022-10-27 DIAGNOSIS — B86 Scabies: Secondary | ICD-10-CM

## 2022-10-27 MED ORDER — TRIAMCINOLONE ACETONIDE 0.1 % EX CREA: 1.0000 | TOPICAL_CREAM | Freq: Two times a day (BID) | CUTANEOUS | 0 refills | Status: DC

## 2022-10-27 MED ORDER — IVERMECTIN 3 MG PO TABS: 200.0000 ug/kg | ORAL_TABLET | ORAL | 0 refills | Status: DC

## 2022-10-27 NOTE — ED Triage Notes (Signed)
Pt c/o possible poison ivy onset ~ last week started on foot then moved to [facility] and eye.

## 2022-10-27 NOTE — Discharge Instructions (Signed)
I have enclosed information about scabies and I hope you find helpful.  Also included are instructions for getting rid of scabies in your home.  I sent a prescription for ivermectin to your pharmacy.  Please take 3-1/2 tablets when you pick up your prescription today and take your second dose of 3-1/2 tablets 14 days from now.  I also sent a prescription for triamcinolone cream to your pharmacy that you can apply to the itchy areas twice daily while you are waiting for the ivermectin to take full effect.  In 28 days, if your rash is still present, please follow-up with the dermatology specialist for further evaluation.

## 2022-10-27 NOTE — ED Provider Notes (Signed)
[facility]    CSN: [id number] Arrival date & time: 10/27/22  1618    HISTORY   Chief Complaint  Patient presents with   Rash   HPI Kimberly Browning is a pleasant, 47 y.o. female who presents to urgent care today. Patient reports a 2-day history of rash that began on her feet then appeared both antecubitus and is not present on her left upper face.  Patient states she has never had a similar rash in the past.  Patient states she has not been doing any yard work but her boyfriend told her he thinks it may be poison ivy.  Patient states has not tried anything to alleviate the itching from the rash.  Patient states she has been scratching a lot because she cannot help it.   The history is provided by the patient.   Past Medical History:  Diagnosis Date   Anxiety    Bipolar disorder (HCC)    Depression    Dry skin    hands   Feeling of incomplete bladder emptying    GERD (gastroesophageal reflux disease)    Hypercholesteremia    Nocturia    Sleep apnea    Urge urinary incontinence    Vitamin D deficiency    Wears contact lenses    Patient Active Problem List   Diagnosis Date Noted   Ingrown toenail without infection 05/26/2022   New daily persistent headache 12/10/2021   Tension headache 09/21/2021   Weight loss 09/21/2021   Vaginal itching 08/07/2021   Loose stools 08/07/2021   Chest wall asymmetry 04/27/2021   General counselling and advice on contraception 04/07/2021   Diarrhea 02/25/2021   Overweight (BMI 25.0-29.9) 12/01/2020   Globus sensation 12/01/2020   Dysphagia 11/25/2020   Vaginal discharge 11/20/2020   Abdominal wall pain in flank 08/27/2020   Symptomatic mammary hypertrophy 08/15/2020   Elevated serum creatinine 07/17/2020   IUD contraception 03/18/2020   Onychomycosis of left great toe 10/24/2019   Obesity (BMI 30.0-34.9) 08/24/2019   Depression with anxiety    Gastroesophageal reflux disease without esophagitis 01/29/2016    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   Acne 04/14/2012   History of tobacco use 06/09/2009   History of molar pregnancy, antepartum 12/12/2008   INSOMNIA 12/11/2008   HERPES LABIALIS 10/10/2007   Hyperlipidemia 01/03/2007   Urinary incontinence, mixed 11/25/2006   BIPOLAR DISORDER 08/04/2006   Seborrheic dermatitis 08/04/2006   Past Surgical History:  Procedure Laterality Date   BREAST REDUCTION SURGERY  05/10/2011   Procedure: MAMMARY REDUCTION BILATERAL (BREAST);  Surgeon: Rosalio Macadamia;  Location: [facility];  Service: Plastics;  Laterality: Bilateral;  bilateral breast reduction   DILATION AND EVACUATION  12/26/2008   x 2   INTERSTIM IMPLANT PLACEMENT N/A 07/09/2014   Procedure: Leane Platt IMPLANT FIRST STAGE;  Surgeon: Martina Sinner, MD;  Location: [facility];  Service: Urology;  Laterality: N/A;   INTERSTIM IMPLANT PLACEMENT N/A 07/09/2014   Procedure: Leane Platt IMPLANT SECOND STAGE;  Surgeon: Martina Sinner, MD;  Location: [facility];  Service: Urology;  Laterality: N/A;   INTRAUTERINE DEVICE (IUD) INSERTION  sept 2011   mirena   WISDOM TOOTH EXTRACTION  as a teenager   OB History   No obstetric history on file.    Home Medications    Prior to Admission medications   Medication Sig Start Date End Date Taking? Authorizing Provider  amantadine (SYMMETREL) 100 MG capsule Take 100 mg by mouth daily.  01/02/20   [provider]  ammonium lactate (AMLACTIN) 12 % cream Apply 1 Application topically as needed for dry skin. 08/31/22   McDonald, Rachelle Hora, DPM  betamethasone dipropionate 0.05 % cream Apply topically 2 (two) times daily. 09/07/22   McDonald, Rachelle Hora, DPM  diclofenac (VOLTAREN) 75 MG EC tablet Take 1 tablet by mouth 2 (two) times daily.    [provider]  doxepin (SINEQUAN) 150 MG capsule TAKE 2 CAPSULES BY MOUTH 45 MINUTES BEFORE BED.    [provider]  hydrOXYzine (ATARAX) 50 MG tablet  SMARTSIG:1 Tablet(s) By Mouth Morning-Night 08/17/22   [provider]  imipramine (TOFRANIL) 25 MG tablet Take 25 mg by mouth at bedtime. 08/17/22   [provider]  ketoconazole (NIZORAL) 2 % cream Apply 1 Application topically daily. 08/31/22   Edwin Cap, DPM  lamoTRIgine (LAMICTAL) 200 MG tablet Take 400 mg by mouth daily. 08/02/20   [provider]  meclizine (ANTIVERT) 25 MG tablet     [provider]  metFORMIN (GLUCOPHAGE-XR) 750 MG 24 hr tablet     [provider]  metoCLOPramide (REGLAN) 10 MG tablet TAKE 1 TABLET BY MOUTH EVERY 6 HOURS AS NEEDED FOR NAUSEA FOR VOMITING Patient not taking: Reported on 07/09/2022 08/06/21   Sabino Dick, DO  mirtazapine (REMERON) 30 MG tablet Take 30 mg by mouth at bedtime. 01/15/20   [provider]  propranolol (INDERAL) 40 MG tablet Take 1 tablet (40 mg total) by mouth 2 (two) times daily. Patient not taking: Reported on 02/04/2022 12/09/21   Simmons-Robinson, Tawanna Cooler, MD  ranitidine (ZANTAC) 300 MG tablet Take 1 tablet by mouth at bedtime.    [provider]  rOPINIRole (REQUIP) 0.25 MG tablet     [provider]  SUMAtriptan (IMITREX) 25 MG tablet Take 1 tablet (25 mg total) by mouth every 2 (two) hours as needed for migraine or headache. May repeat in 2 hours if headache persists or recurs. Patient not taking: Reported on 05/04/2022 12/09/21   Simmons-Robinson, Tawanna Cooler, MD  temazepam (RESTORIL) 15 MG capsule Take 3 capsules by mouth daily. 07/28/20   [provider]    Family History Family History  Adopted: Yes  Problem Relation Age of Onset   Headache Neg Hx    Migraines Neg Hx    Social History Social History   Tobacco Use   Smoking status: Former    Packs/day: 0.25    Years: 19.00    Additional pack years: 0.00    Total pack years: 4.75    Types: Cigarettes    Quit date: 09/23/2019    Years since quitting: 3.0    Passive exposure: Past   Smokeless  tobacco: Never   Tobacco comments:     1 pp3d  Vaping Use   Vaping Use: Some days  Substance Use Topics   Alcohol use: Not Currently   Drug use: No   Allergies   Geodon [ziprasidone hydrochloride], Hibiclens [chlorhexidine], Sulfamethoxazole-trimethoprim, Doxycycline, Gabapentin, Other, Penicillins, Promethazine, and Depakote [divalproex sodium]  Review of Systems Review of Systems Pertinent findings revealed after performing a 14 point review of systems has been noted in the history of present illness.  Physical Exam Vital Signs BP 109/73 (BP Location: Left Arm)   Pulse 78   Temp 98.5 F (36.9 C) (Oral)   Resp 16   LMP 07/31/2020   SpO2 98%   No data found.  Physical Exam Vitals and nursing note reviewed.  Constitutional:  General: She is not in acute distress.    Appearance: Normal appearance.  HENT:     Head: Normocephalic and atraumatic.  Eyes:     Pupils: Pupils are equal, round, and reactive to light.  Cardiovascular:     Rate and Rhythm: Regular rhythm.  Pulmonary:     Effort: Pulmonary effort is normal.     Breath sounds: Normal breath sounds.  Musculoskeletal:        General: Normal range of motion.     Cervical back: Normal range of motion and neck supple.  Skin:    General: Skin is warm and dry.     Findings: Rash (Rash concerning for scabies, please see photos below.) present.  Neurological:     General: No focal deficit present.     Mental Status: She is alert and oriented to person, place, and time. Mental status is at baseline.  Psychiatric:        Mood and Affect: Mood normal.        Behavior: Behavior normal.        Thought Content: Thought content normal.        Judgment: Judgment normal.                 Visual Acuity Right Eye Distance:   Left Eye Distance:   Bilateral Distance:    Right Eye Near:   Left Eye Near:    Bilateral Near:     UC Couse / Diagnostics / Procedures:     Radiology No results  found.  Procedures Procedures (including critical care time) EKG  Pending results:  Labs Reviewed - No data to display  Medications Ordered in UC: Medications - No data to display  UC Diagnoses / Final Clinical Impressions(s)   I have reviewed the triage vital signs and the nursing notes.  Pertinent labs & imaging results that were available during my care of the patient were reviewed by me and considered in my medical decision making (see chart for details).    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  Patient politely refused permethrin cream citing sensitive skin.  Patient provided with 3.5 tablets of ivermectin to be taken once every 10 days.  Patient provided with instructions for eradicating scabies from her home.  Patient provided with triamcinolone cream for itching while she is waiting for ivermectin to take full effect.  Conservative care recommended.  Return precautions advised,  Please see discharge instructions below for details of plan of care as provided to patient. ED Prescriptions     Medication Sig Dispense Auth. Provider   triamcinolone cream (KENALOG) 0.1 % Apply 1 Application topically 2 (two) times daily. Apply to affected area(s) twice daily , do not apply to face. 80 g Theadora Rama Scales, PA-C   ivermectin (STROMECTOL) 3 MG TABS tablet Take 3.5 tablets (10,500 mcg total) by mouth every 14 (fourteen) days for 2 doses. Take first dose today.  Take second dose 2 weeks from today. 7 tablet Theadora Rama Scales, PA-C      PDMP not reviewed this encounter.  Pending results:  Labs Reviewed - No data to display  Discharge Instructions:   Discharge Instructions      I have enclosed information about scabies and I hope you find helpful.  Also included are instructions for getting rid of scabies in your home.  I sent a prescription for ivermectin  to  your pharmacy.  Please take 3-1/2 tablets when you pick up your prescription today and take your second dose of 3-1/2 tablets 14 days from now.  I also sent a prescription for triamcinolone cream to your pharmacy that you can apply to the itchy areas twice daily while you are waiting for the ivermectin to take full effect.  In 28 days, if your rash is still present, please follow-up with the dermatology specialist for further evaluation.      Disposition Upon Discharge:  Condition: stable for discharge home  Patient presented with an acute illness with associated systemic symptoms and significant discomfort requiring urgent management. In my opinion, this is a condition that a prudent lay person (someone who possesses an average knowledge of health and medicine) may potentially expect to result in complications if not addressed urgently such as respiratory distress, impairment of bodily function or dysfunction of bodily organs.   Routine symptom specific, illness specific and/or disease specific instructions were discussed with the patient and/or caregiver at length.   As such, the patient has been evaluated and assessed, work-up was performed and treatment was provided in alignment with urgent care protocols and evidence based medicine.  Patient/parent/caregiver has been advised that the patient may require follow up for further testing and treatment if the symptoms continue in spite of treatment, as clinically indicated and appropriate.  Patient/parent/caregiver has been advised to return to the [facility] or PCP if no better; to PCP or the Emergency Department if new signs and symptoms develop, or if the current signs or symptoms continue to change or worsen for further workup, evaluation and treatment as clinically indicated and appropriate  The patient will follow up with their current PCP if and as advised. If the patient does not currently have a PCP we will assist them in obtaining one.    The patient may need specialty follow up if the symptoms continue, in spite of conservative treatment and management, for further workup, evaluation, consultation and treatment as clinically indicated and appropriate.  Patient/parent/caregiver verbalized understanding and agreement of plan as discussed.  All questions were addressed during visit.  Please see discharge instructions below for further details of plan.  This office note has been dictated using Teaching laboratory technician.  Unfortunately, this method of dictation can sometimes lead to typographical or grammatical errors.  I apologize for your inconvenience in advance if this occurs.  Please do not hesitate to reach out to me if clarification is needed.      Theadora Rama Scales, [place] 10/27/22 [phone]

## 2022-10-28 ENCOUNTER — Telehealth: Payer: Self-pay

## 2022-10-28 DIAGNOSIS — B86 Scabies: Secondary | ICD-10-CM

## 2022-10-28 MED ORDER — IVERMECTIN 3 MG PO TABS
200.0000 ug/kg | ORAL_TABLET | ORAL | 0 refills | Status: DC
Start: 2022-10-28 — End: 2022-11-01

## 2022-10-28 NOTE — Telephone Encounter (Signed)
Completed and Rx sent with diagnosis code

## 2022-10-28 NOTE — Telephone Encounter (Signed)
Patient calls nurse line regarding prescription for Scabies. She was seen in the urgent care yesterday and was prescribed Ivermectin. Patient reports that she cannot afford this prescription.   Called pharmacy. Pharmacist reports that with patient's medicare plan, it requires a diagnosis code.   I am unable to provide this over the phone, as Medicare requires it to be submitted on the prescription.   Forwarding to precepting attending, as Medicare also requires attending for coverage.   Veronda Prude, RN

## 2022-11-01 ENCOUNTER — Ambulatory Visit (HOSPITAL_COMMUNITY)
Admission: EM | Admit: 2022-11-01 | Discharge: 2022-11-01 | Disposition: A | Discharge status: Home / Self Care | Payer: Medicare Other | Attending: Emergency Medicine | Admitting: Emergency Medicine

## 2022-11-01 ENCOUNTER — Encounter (HOSPITAL_COMMUNITY): Payer: Self-pay

## 2022-11-01 DIAGNOSIS — L259 Unspecified contact dermatitis, unspecified cause: Secondary | ICD-10-CM

## 2022-11-01 MED ORDER — PREDNISONE 10 MG (21) PO TBPK: ORAL_TABLET | Freq: Every day | ORAL | 0 refills | Status: DC

## 2022-11-01 NOTE — ED Triage Notes (Signed)
Pt c/o rash that is worsening in her groin area reports she was seen at this UC a few days ago and has since been getting worse. Rash also on face around left eye, on chest.

## 2022-11-01 NOTE — ED Provider Notes (Signed)
[facility]    CSN: [id number] Arrival date & time: 11/01/22  1317     History   Chief Complaint Chief Complaint  Patient presents with   Rash    HPI Kimberly Browning is a 47 y.o. female.  Here with rash Seen on 5/22 and dx with scabies. See previous provider note for full visit details. She started ivermectin. Reporting rash is worsening, increasing pruritus Worsening on face and arms. Has spread to her bilateral thighs as well  She was trying kenalog cream without relief No shortness of breath or trouble breathing. Denies oral swelling  Denies known exposures to allergens. No new meds, food, products, etc  Past Medical History:  Diagnosis Date   Anxiety    Bipolar disorder (HCC)    Depression    Dry skin    hands   Feeling of incomplete bladder emptying    GERD (gastroesophageal reflux disease)    Hypercholesteremia    Nocturia    Sleep apnea    Urge urinary incontinence    Vitamin D deficiency    Wears contact lenses     Patient Active Problem List   Diagnosis Date Noted   Ingrown toenail without infection 05/26/2022   New daily persistent headache 12/10/2021   Tension headache 09/21/2021   Weight loss 09/21/2021   Vaginal itching 08/07/2021   Loose stools 08/07/2021   Chest wall asymmetry 04/27/2021   General counselling and advice on contraception 04/07/2021   Diarrhea 02/25/2021   Overweight (BMI 25.0-29.9) 12/01/2020   Globus sensation 12/01/2020   Dysphagia 11/25/2020   Vaginal discharge 11/20/2020   Abdominal wall pain in flank 08/27/2020   Symptomatic mammary hypertrophy 08/15/2020   Elevated serum creatinine 07/17/2020   IUD contraception 03/18/2020   Onychomycosis of left great toe 10/24/2019   Obesity (BMI 30.0-34.9) 08/24/2019   Depression with anxiety    Gastroesophageal reflux disease without esophagitis 01/29/2016   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   Acne 04/14/2012   History of tobacco use 06/09/2009   History of  molar pregnancy, antepartum 12/12/2008   INSOMNIA 12/11/2008   HERPES LABIALIS 10/10/2007   Hyperlipidemia 01/03/2007   Urinary incontinence, mixed 11/25/2006   BIPOLAR DISORDER 08/04/2006   Seborrheic dermatitis 08/04/2006    Past Surgical History:  Procedure Laterality Date   BREAST REDUCTION SURGERY  05/10/2011   Procedure: MAMMARY REDUCTION BILATERAL (BREAST);  Surgeon: Rosalio Macadamia;  Location: [facility];  Service: Plastics;  Laterality: Bilateral;  bilateral breast reduction   DILATION AND EVACUATION  12/26/2008   x 2   INTERSTIM IMPLANT PLACEMENT N/A 07/09/2014   Procedure: Leane Platt IMPLANT FIRST STAGE;  Surgeon: Martina Sinner, MD;  Location: [facility];  Service: Urology;  Laterality: N/A;   INTERSTIM IMPLANT PLACEMENT N/A 07/09/2014   Procedure: Leane Platt IMPLANT SECOND STAGE;  Surgeon: Martina Sinner, MD;  Location: [facility] [place];  Service: Urology;  Laterality: N/A;   INTRAUTERINE DEVICE (IUD) INSERTION  sept 2011   mirena   WISDOM TOOTH EXTRACTION  as a teenager    OB History   No obstetric history on file.      Home Medications    Prior to Admission medications   Medication Sig Start Date End Date Taking? Authorizing Provider  predniSONE (STERAPRED UNI-PAK 21 TAB) 10 MG (21) TBPK tablet Take by mouth daily. Please take as directed 11/01/22  Yes Leighla Chestnutt, Lurena Joiner, PA-C  amantadine (SYMMETREL) 100 MG capsule Take 100 mg by mouth daily. 01/02/20  [provider]  ammonium lactate (AMLACTIN) 12 % cream Apply 1 Application topically as needed for dry skin. 08/31/22   McDonald, Rachelle Hora, DPM  diclofenac (VOLTAREN) 75 MG EC tablet Take 1 tablet by mouth 2 (two) times daily.    [provider]  doxepin (SINEQUAN) 150 MG capsule TAKE 2 CAPSULES BY MOUTH 45 MINUTES BEFORE BED.    [provider]  hydrOXYzine (ATARAX) 50 MG tablet SMARTSIG:1 Tablet(s) By Mouth Morning-Night 08/17/22   [provider]  imipramine (TOFRANIL) 25 MG tablet Take 25 mg by mouth at bedtime. 08/17/22   [provider]  lamoTRIgine (LAMICTAL) 200 MG tablet Take 400 mg by mouth daily. 08/02/20   [provider]  meclizine (ANTIVERT) 25 MG tablet     [provider]  metFORMIN (GLUCOPHAGE-XR) 750 MG 24 hr tablet     [provider]  mirtazapine (REMERON) 30 MG tablet Take 30 mg by mouth at bedtime. 01/15/20   [provider]  ranitidine (ZANTAC) 300 MG tablet Take 1 tablet by mouth at bedtime.    [provider]  rOPINIRole (REQUIP) 0.25 MG tablet     [provider]  SUMAtriptan (IMITREX) 25 MG tablet Take 1 tablet (25 mg total) by mouth every 2 (two) hours as needed for migraine or headache. May repeat in 2 hours if headache persists or recurs. Patient not taking: Reported on 05/04/2022 12/09/21   Simmons-Robinson, Tawanna Cooler, MD  temazepam (RESTORIL) 15 MG capsule Take 3 capsules by mouth daily. 07/28/20   [provider]    Family History Family History  Adopted: Yes  Problem Relation Age of Onset   Headache Neg Hx    Migraines Neg Hx     Social History Social History   Tobacco Use   Smoking status: Former    Packs/day: 0.25    Years: 19.00    Additional pack years: 0.00    Total pack years: 4.75    Types: Cigarettes    Quit date: 09/23/2019    Years since quitting: 3.1    Passive exposure: Past   Smokeless tobacco: Never   Tobacco comments:     1 pp3d  Vaping Use   Vaping Use: Some days  Substance Use Topics   Alcohol use: Not Currently   Drug use: No     Allergies   Geodon [ziprasidone hydrochloride], Hibiclens [chlorhexidine], Sulfamethoxazole-trimethoprim, Doxycycline, Gabapentin, Other, Penicillins, Promethazine, and Depakote [divalproex sodium]   Review of Systems Review of Systems  Skin:  Positive for rash.   As per HPI  Physical Exam Triage Vital Signs ED Triage Vitals [11/01/22 1402]  Enc  Vitals Group     BP 95/63     Pulse Rate 70     Resp 16     Temp 98.4 F (36.9 C)     Temp Source Oral     SpO2 98 %     Weight      Height      Head Circumference      Peak Flow      Pain Score 0     Pain Loc      Pain Edu?      Excl. in GC?    No data found.  Updated Vital Signs BP 95/63 (BP Location: Right Arm)   Pulse 70   Temp 98.4 F (36.9 C) (Oral)   Resp 16   LMP 07/31/2020   SpO2 98%   Physical Exam Vitals and nursing note reviewed.  Constitutional:      General: She is not in acute distress.    Appearance: Normal appearance.  HENT:     Mouth/Throat:     Mouth: Mucous membranes are moist.     Pharynx: Oropharynx is clear. No posterior oropharyngeal erythema.     Comments: No swelling. Normal phonation.  Eyes:     Conjunctiva/sclera: Conjunctivae normal.     Pupils: Pupils are equal, round, and reactive to light.  Cardiovascular:     Rate and Rhythm: Normal rate and regular rhythm.     Pulses: Normal pulses.     Heart sounds: Normal heart sounds.  Pulmonary:     Effort: Pulmonary effort is normal. No respiratory distress.     Breath sounds: Normal breath sounds. No wheezing.     Comments: Clear lungs throughout. Normal WOB Abdominal:     General: Bowel sounds are normal.     Tenderness: There is no abdominal tenderness.  Musculoskeletal:        General: Normal range of motion.     Cervical back: Normal range of motion.  Skin:    Findings: Rash present. Rash is macular, papular and vesicular.     Comments: Erythematous maculopapular rash with some vesicles noted to left forehead, cheek, neck and chest. There are two patches of erythematous rash on bilateral inner thighs. Several vesicular lesions in linear streaking pattern bilat antecubital   Neurological:     Mental Status: She is alert and oriented to person, place, and time.      UC Treatments / Results  Labs (all labs ordered are listed, but only abnormal results are displayed) Labs  Reviewed - No data to display  EKG   Radiology No results found.  Procedures Procedures (including critical care time)  Medications Ordered in UC Medications - No data to display  Initial Impression / Assessment and Plan / UC Course  I have reviewed the triage vital signs and the nursing notes.  Pertinent labs & imaging results that were available during my care of the patient were reviewed by me and considered in my medical decision making (see chart for details).  Does not appear to be scabies Rash has worsened since previous visit, compared to pictures taken by provider on 5/22. Suspect contact dermatitis. Advised to discontinue kenalog and ivermectin, instead will do 21 tab prednisone taper. Recommend to follow with her PCP if still having problems. Return or ED for new or sudden worsening of symptoms. Patient agreeable to plan. All questions answered  Final Clinical Impressions(s) / UC Diagnoses   Final diagnoses:  Contact dermatitis, unspecified contact dermatitis type, unspecified trigger     Discharge Instructions      I do not think you have scabies. It seems more like a contact dermatitis.  You can stop the ivermectin  Instead, please take the Prednisone (steroid) as prescribed. 6 pills day 1 5 pills day 2 4 pills day 3 3 pills day 4 2 pills day 5 1 pill day 6  Finish all the pills!! Please follow-up with your primary care provider if you are still having problems    ED Prescriptions     Medication Sig Dispense Auth. Provider   predniSONE (STERAPRED UNI-PAK 21 TAB) 10 MG (21) TBPK tablet Take by mouth daily. Please take as directed 21 tablet Enes Wegener, Lurena Joiner, PA-C      PDMP not reviewed this encounter.   Levell Tavano, Lurena Joiner, PA-C 11/01/22 1500

## 2022-11-01 NOTE — Discharge Instructions (Addendum)
I do not think you have scabies. It seems more like a contact dermatitis.  You can stop the ivermectin  Instead, please take the Prednisone (steroid) as prescribed. 6 pills day 1 5 pills day 2 4 pills day 3 3 pills day 4 2 pills day 5 1 pill day 6  Finish all the pills!! Please follow-up with your primary care provider if you are still having problems

## 2022-11-02 ENCOUNTER — Ambulatory Visit: Payer: Medicare Other | Admitting: Podiatry

## 2022-11-03 ENCOUNTER — Ambulatory Visit: Payer: Medicare Other | Admitting: Dermatology

## 2022-11-05 ENCOUNTER — Encounter: Payer: Self-pay | Admitting: Family Medicine

## 2022-11-05 ENCOUNTER — Ambulatory Visit (INDEPENDENT_AMBULATORY_CARE_PROVIDER_SITE_OTHER): Payer: Medicare Other | Admitting: Family Medicine

## 2022-11-05 ENCOUNTER — Telehealth: Payer: Self-pay

## 2022-11-05 VITALS — BP 116/56 | HR 64 | Ht 63.0 in | Wt 119.8 lb

## 2022-11-05 DIAGNOSIS — Z872 Personal history of diseases of the skin and subcutaneous tissue: Secondary | ICD-10-CM | POA: Diagnosis not present

## 2022-11-05 DIAGNOSIS — L259 Unspecified contact dermatitis, unspecified cause: Secondary | ICD-10-CM | POA: Insufficient documentation

## 2022-11-05 MED ORDER — CETIRIZINE HCL 10 MG PO TABS
10.0000 mg | ORAL_TABLET | Freq: Every day | ORAL | 0 refills | Status: DC
Start: 2022-11-05 — End: 2023-02-18

## 2022-11-05 MED ORDER — HYDROCORTISONE 0.5 % EX OINT
1.0000 | TOPICAL_OINTMENT | Freq: Two times a day (BID) | CUTANEOUS | 0 refills | Status: DC
Start: 2022-11-05 — End: 2022-11-11

## 2022-11-05 NOTE — Assessment & Plan Note (Addendum)
-  urticarial rash, likely secondary to contact dermatitis and eczema -prescribed zyrtec daily -continue atarax as needed at bedtime -prescribed hydrocortisone cream to apply to affected area -encouraged avoiding itching as this can further spread the rash -take 2 tablets of prednisone daily until completion of course -pending CBC and CMP to assess for possible drug reaction during short course of ivermectin although patient states her symptoms started prior to starting ivermectin  -close follow up in 1 week to monitor progression of symptoms

## 2022-11-05 NOTE — Progress Notes (Signed)
    SUBJECTIVE:   CHIEF COMPLAINT / HPI:   Patient presents for follow up from urgent care visit a few days ago. Rash started about 2 weeks ago, started along her feet and toes. She does have ant issues near her house and this may be contributing. Denies any known exposures otherwise, unsure if she came in contact with any poison ivy. Denies having this in the past. Denies fever, chills and drainage. Has noticed it has been spreading throughout the rest of her legs and arms. Reports that it is very itchy and irritating. Denies pain or other concerns at this time. Was given prednisone taper at the urgent care, on day 5 of the course but she has been taking less than the amount prescribed since it kept her up at night.   OBJECTIVE:   BP (!) 116/56   Pulse 64   Ht 5\' 3"  (1.6 m)   Wt 119 lb 12.8 oz (54.3 kg)   LMP 07/31/2020   SpO2 100%   BMI 21.22 kg/m   General: Patient well-appearing, in no acute distress. Resp: normal work of breathing noted  Derm: urticarial erythematous rash noted along inner thighs bilaterally with excoriations and mild surrounding erythema along upper and lower extremities bilaterally    ASSESSMENT/PLAN:   Contact dermatitis -urticarial rash, likely secondary to contact dermatitis and eczema -prescribed zyrtec daily -continue atarax as needed at bedtime -prescribed hydrocortisone cream to apply to affected area -encouraged avoiding itching as this can further spread the rash -take 2 tablets of prednisone daily until completion of course -pending CBC and CMP to assess for possible drug reaction during short course of ivermectin although patient states her symptoms started prior to starting ivermectin  -close follow up in 1 week to monitor progression of symptoms    Kimberly Browning Robyne Peers, DO [facility]

## 2022-11-05 NOTE — Telephone Encounter (Signed)
Patient calls nurse line in regards to prescriptions sent in today for itching.   She reports those will not work for me (cetirizine and hydrocortisone ointment.)  Spoke with [place]. She reports she strongly encourages patient to try these medications.   Patient contacted and advised to try medications over the weekend.   Patient to call me if symptoms are still persistent next week.

## 2022-11-05 NOTE — Patient Instructions (Signed)
It was great seeing you today!  Today we discussed your rash, this seems like a contact dermatitis. I have prescribed zyrtec, please take this oral pill daily. I have also prescribed an ointment called hydrocortisone, please apply this to the affected areas multiple times a day.   Please take 2 tablets a day of the prednisone until you complete the course.  Please try to avoid itching as this can cause the rash to further spread.   We will get blood work today to check for an infection and monitor for other signs.   Please follow up at your next scheduled appointment in 1 week, if anything arises between now and then, please don't hesitate to contact our office.   Thank you for allowing [place] to be a part of your medical care!  Thank you, Dr. Robyne Peers

## 2022-11-06 LAB — CBC
Hematocrit: 38 % (ref 34.0–46.6)
Hemoglobin: 12.4 g/dL (ref 11.1–15.9)
MCH: 27.4 pg (ref 26.6–33.0)
MCHC: 32.6 g/dL (ref 31.5–35.7)
MCV: 84 fL (ref 79–97)
Platelets: 309 10*3/uL (ref 150–450)
RBC: 4.52 x10E6/uL (ref 3.77–5.28)
RDW: 14.2 % (ref 11.7–15.4)
WBC: 11.5 10*3/uL — ABNORMAL HIGH (ref 3.4–10.8)

## 2022-11-06 LAB — COMPREHENSIVE METABOLIC PANEL
ALT: 16 IU/L (ref 0–32)
AST: 16 IU/L (ref 0–40)
Albumin/Globulin Ratio: 2.1 (ref 1.2–2.2)
Albumin: 4.6 g/dL (ref 3.9–4.9)
Alkaline Phosphatase: 102 IU/L (ref 44–121)
BUN/Creatinine Ratio: 14 (ref 9–23)
BUN: 13 mg/dL (ref 6–24)
Bilirubin Total: 0.3 mg/dL (ref 0.0–1.2)
CO2: 24 mmol/L (ref 20–29)
Calcium: 9.8 mg/dL (ref 8.7–10.2)
Chloride: 101 mmol/L (ref 96–106)
Creatinine, Ser: 0.9 mg/dL (ref 0.57–1.00)
Globulin, Total: 2.2 g/dL (ref 1.5–4.5)
Glucose: 90 mg/dL (ref 70–99)
Potassium: 4.1 mmol/L (ref 3.5–5.2)
Sodium: 141 mmol/L (ref 134–144)
Total Protein: 6.8 g/dL (ref 6.0–8.5)
eGFR: 80 mL/min/{1.73_m2} (ref >59)

## 2022-11-08 NOTE — Telephone Encounter (Signed)
Called patient and advised of provider message.   She will follow up on 11/11/22.  Veronda Prude, RN

## 2022-11-08 NOTE — Telephone Encounter (Signed)
Patient returns call to nurse line. She reports that the hydrocortisone makes her skin itch worse.   She is requesting an alternative ointment to the hydrocortisone.   Please advise.  Veronda Prude, RN

## 2022-11-10 IMAGING — CR DG LUMBAR SPINE COMPLETE 4+V
5 series · 5 of 5 positions shown · non-contrast
Comparison: 0383

CLINICAL DATA: Lumbar pain, SI joint pain

EXAM:
LUMBAR SPINE - COMPLETE 4+ VIEW

[w lumbar spine ap]
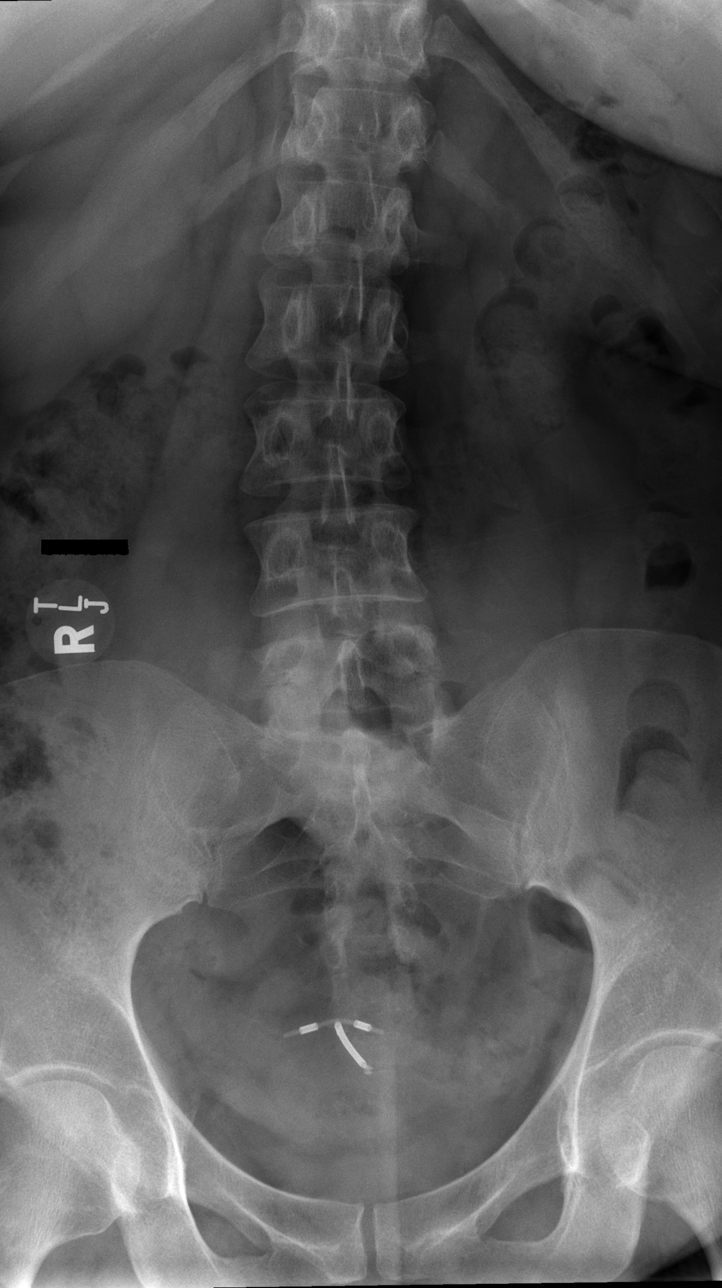

[w lumbar spine obl (1 of 2)]
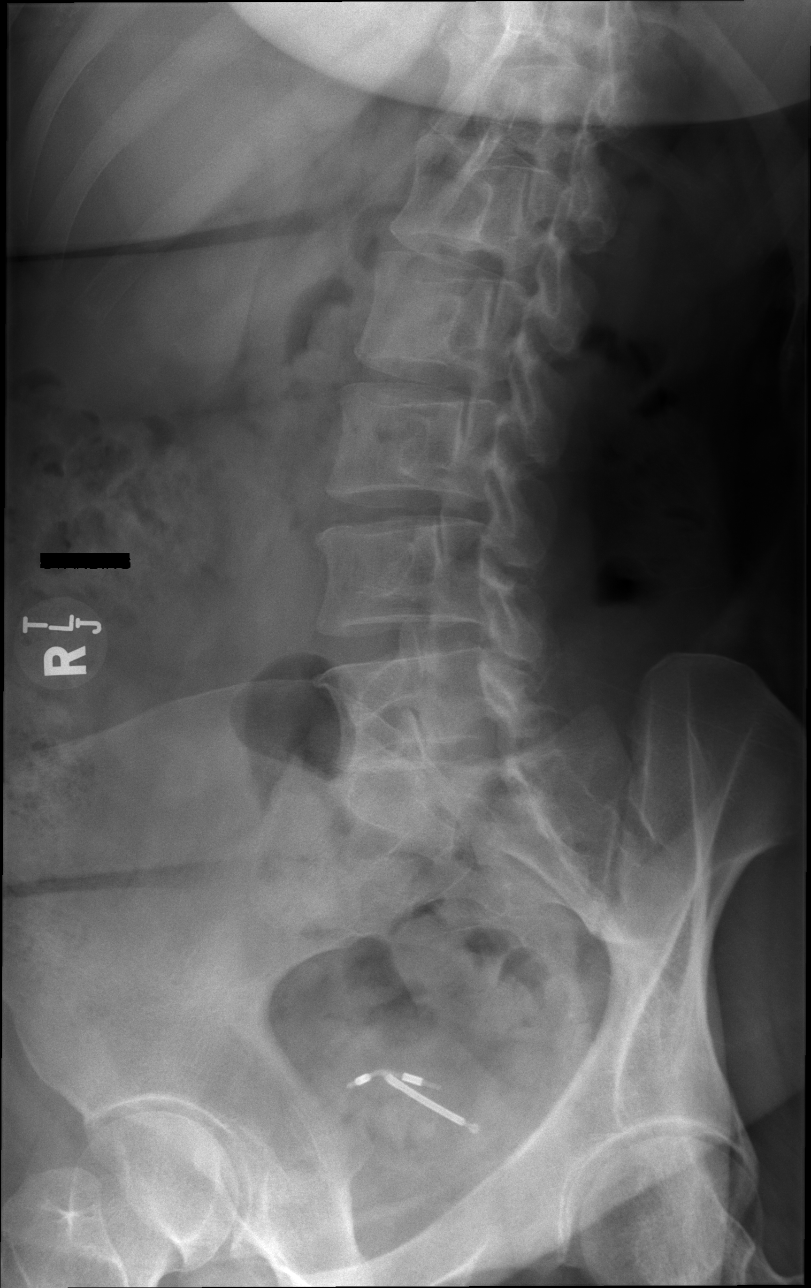

[w lumbar spine obl (2 of 2)]
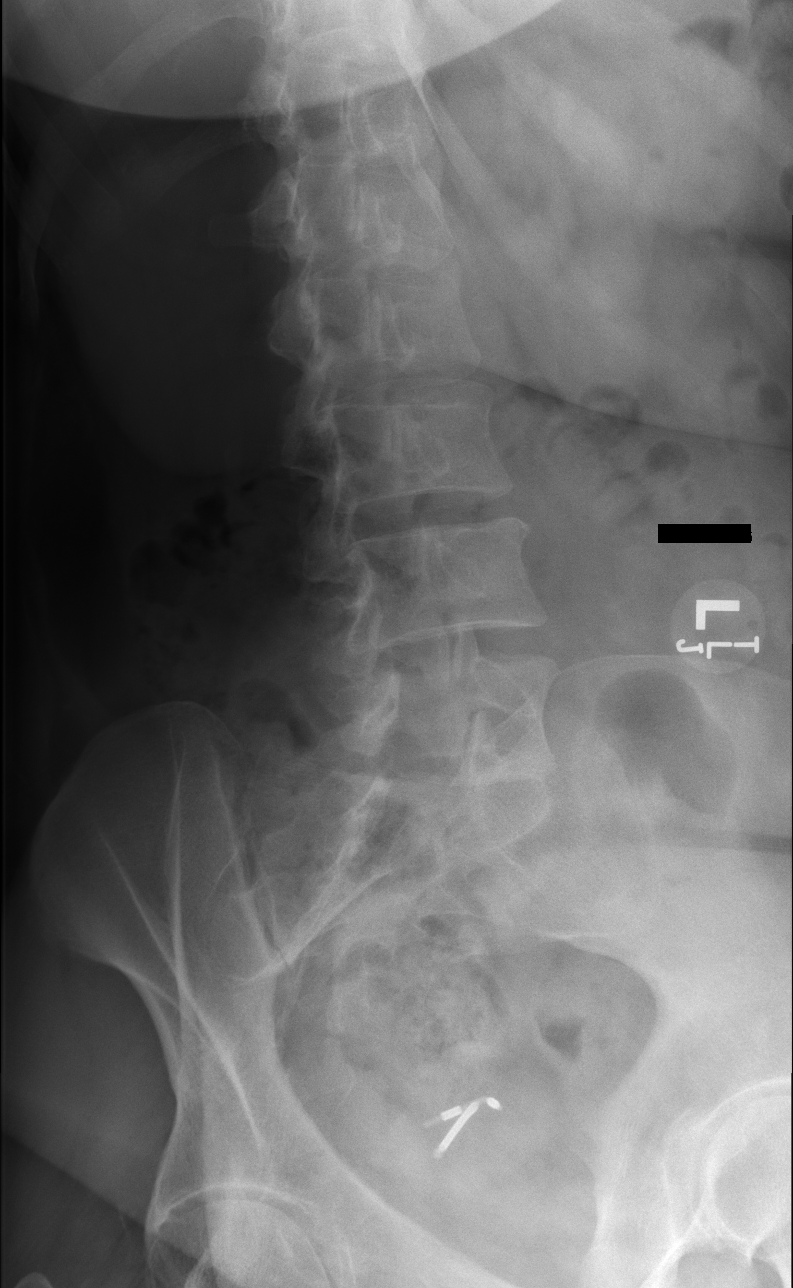

[w lumbar spine lat]
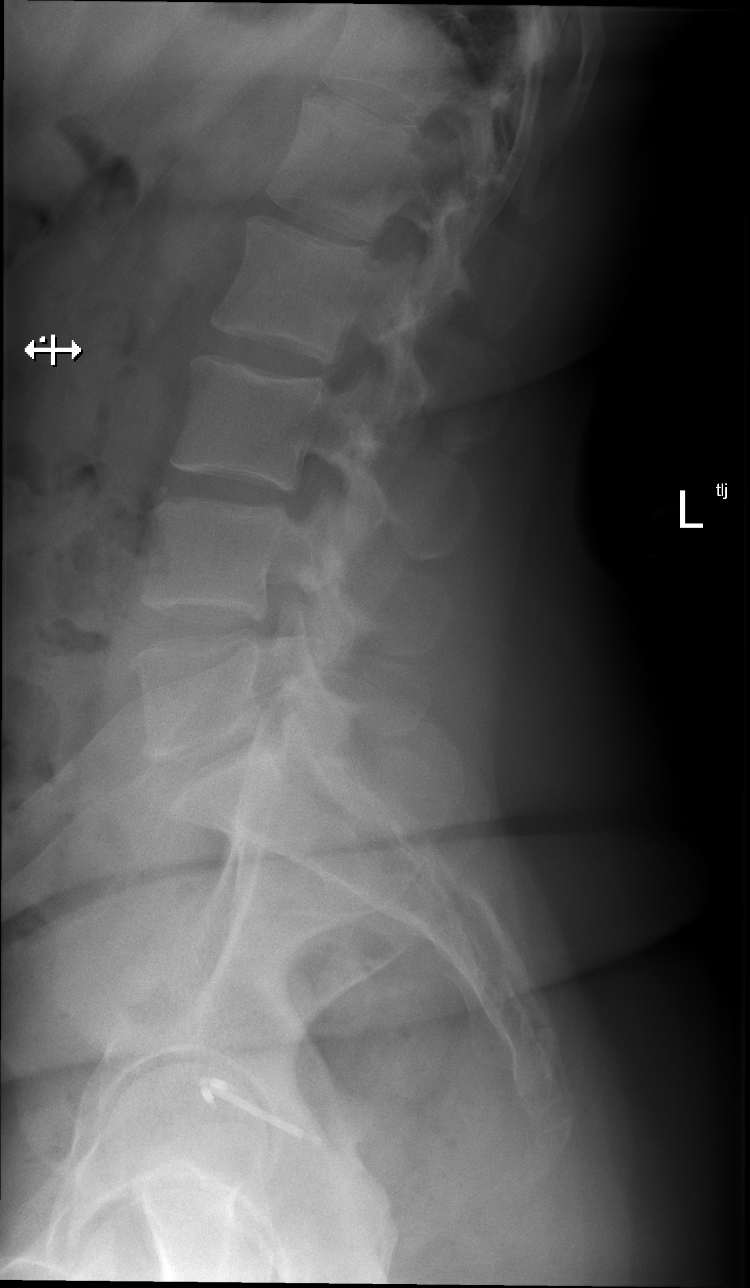

[w lumbar l-5 s-1 spot]
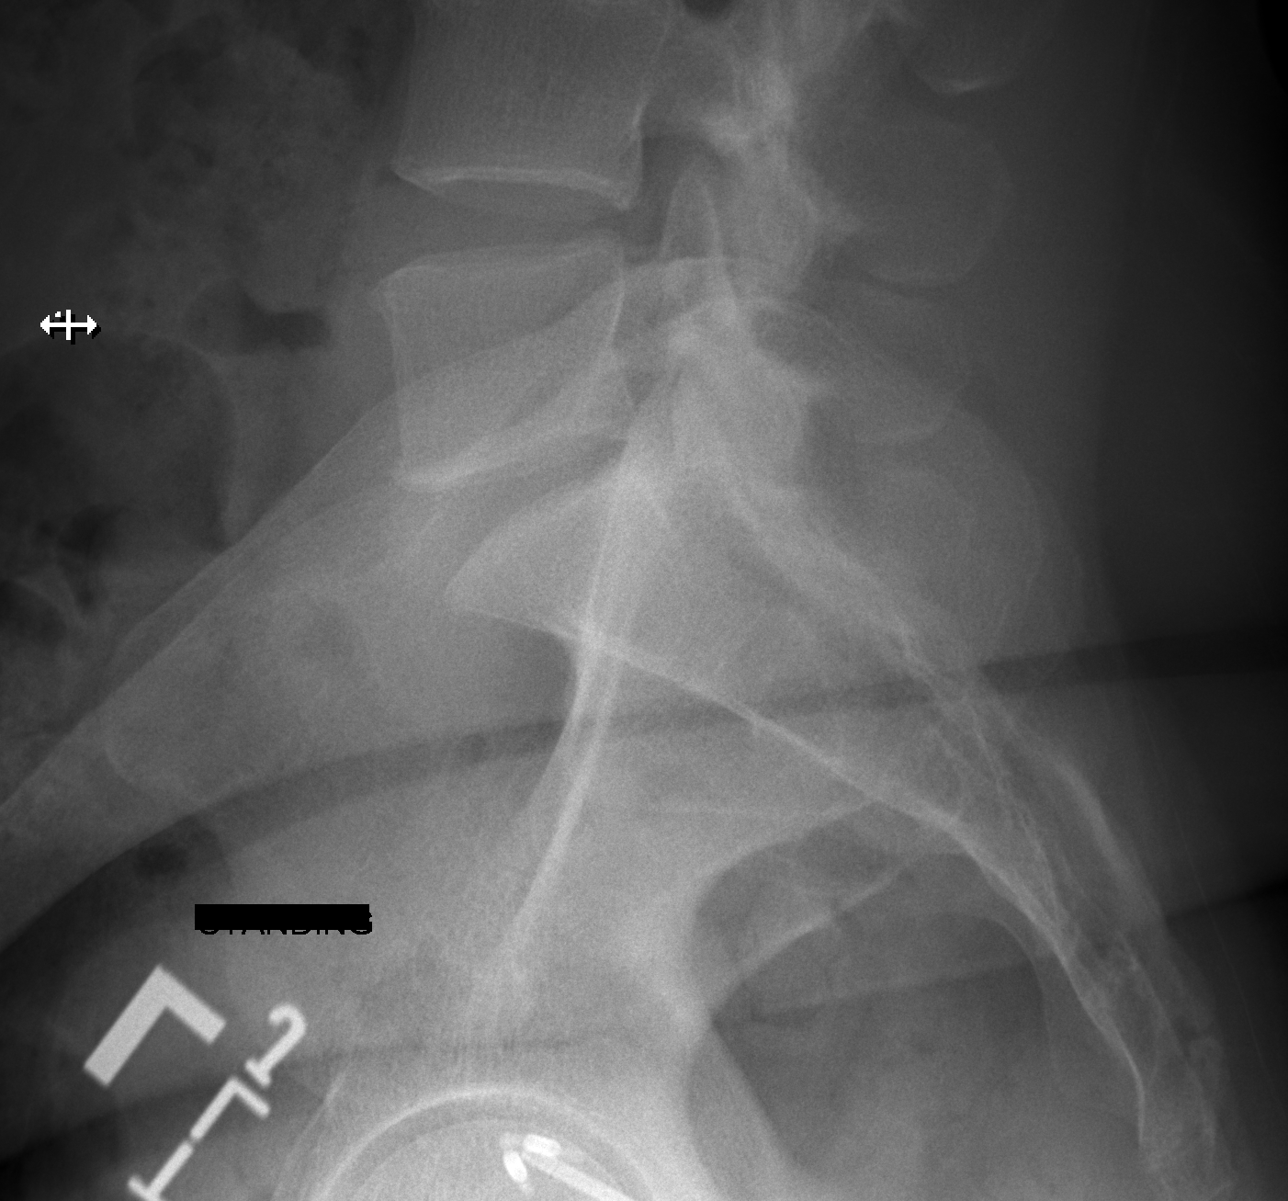

[5 of 5 positions shown; findings below may reference images not displayed]

FINDINGS: Slight dextrocurvature. Five lumbar type vertebral bodies. No
evidence of pars break. Anteroposterior alignment is maintained.
Minor lower lumbar disc space narrowing. No significant facet
hypertrophy. Intrauterine device is present. Sacroiliac joints are
unremarkable
IMPRESSION: Minor degenerative changes.

## 2022-11-11 ENCOUNTER — Ambulatory Visit (HOSPITAL_COMMUNITY)
Admission: EM | Admit: 2022-11-11 | Discharge: 2022-11-11 | Disposition: A | Discharge status: Home / Self Care | Payer: Medicare Other | Attending: Emergency Medicine | Admitting: Emergency Medicine

## 2022-11-11 ENCOUNTER — Encounter: Payer: Self-pay | Admitting: Family Medicine

## 2022-11-11 ENCOUNTER — Telehealth: Payer: Self-pay

## 2022-11-11 ENCOUNTER — Ambulatory Visit (INDEPENDENT_AMBULATORY_CARE_PROVIDER_SITE_OTHER): Payer: Medicare Other | Admitting: Student

## 2022-11-11 ENCOUNTER — Encounter (HOSPITAL_COMMUNITY): Payer: Self-pay | Admitting: *Deleted

## 2022-11-11 VITALS — BP 103/53 | HR 72 | Ht 63.0 in | Wt 122.0 lb

## 2022-11-11 DIAGNOSIS — L282 Other prurigo: Secondary | ICD-10-CM

## 2022-11-11 DIAGNOSIS — L249 Irritant contact dermatitis, unspecified cause: Secondary | ICD-10-CM

## 2022-11-11 DIAGNOSIS — L259 Unspecified contact dermatitis, unspecified cause: Secondary | ICD-10-CM | POA: Diagnosis not present

## 2022-11-11 MED ORDER — METHYLPREDNISOLONE SODIUM SUCC 125 MG IJ SOLR
INTRAMUSCULAR | Status: AC
  Filled 2022-11-11: qty 2

## 2022-11-11 MED ORDER — METHYLPREDNISOLONE SODIUM SUCC 125 MG IJ SOLR
60.0000 mg | Freq: Once | INTRAMUSCULAR | Status: AC
  Administered 2022-11-11: 60 mg via INTRAMUSCULAR

## 2022-11-11 MED ORDER — TRIAMCINOLONE ACETONIDE 0.1 % EX OINT: 1.0000 | TOPICAL_OINTMENT | Freq: Two times a day (BID) | CUTANEOUS | 0 refills | Status: DC

## 2022-11-11 MED ORDER — DIPHENHYDRAMINE HCL 2 % EX CREA: TOPICAL_CREAM | Freq: Three times a day (TID) | CUTANEOUS | 0 refills | Status: DC | PRN

## 2022-11-11 NOTE — Assessment & Plan Note (Signed)
Severe pruritic rash  and exam finding highly suspicious of contact dermatitis.  On exam has diffuse erythematous rash suspicious of contact dermatitis.  Inner thigh lesion most consistent with urticaria.  Suspect rash are worsen by constant itchiness from patient. Distribution of rash are mostly seen on areas reachable by patient.  Few to no lesions on the back.  Patient is currently on hydroxyzine. -Rx triamcinolone 0.1% ointment to be applied twice daily -Continue hydroxyzine as prescribed -Patient to avoid constant itchiness on the affected area -Decrease son exposure -Follow up in 1-2 weeks

## 2022-11-11 NOTE — Telephone Encounter (Signed)
Patient calls nurse line regarding triamcinolone ointment.   She states that her skin is itching and burning "really badly." She applied ointment approx 10 minutes ago.   She states that she looked up the side effects of ointment and they can include itching, burning and irritation.   She states, "no one is helping me out, I don't understand why nobody can figure this out."   Forwarding to Dr. Elliot Gurney.   Veronda Prude, RN

## 2022-11-11 NOTE — Patient Instructions (Signed)
It was wonderful to meet you today. Thank you for allowing me to be a part of your care. Below is a short summary of what we discussed at your visit today:  Suspect the rash you have on your skill is due to allergic reaction.  We have sent in prescription for Triamcinolone ointment which you should apply 2 time daily  Also try to avoid to itch/ scratch your skin  Follow up with your PCP IN 1-2 weeks  Please bring all of your medications to every appointment!  If you have any questions or concerns, please do not hesitate to contact us via phone or MyChart message.   Jerre Simon, MD [facility]

## 2022-11-11 NOTE — ED Triage Notes (Signed)
Pt has a dx of scabies and she seen a provider today and he gave her TAC ointment but it made her lesions worse she say. She is waiting for a call back but wants something else done for them. She has been seen multiple times for this issue.

## 2022-11-11 NOTE — ED Provider Notes (Signed)
[facility]    CSN: [id number] Arrival date & time: 11/11/22  1912      History   Chief Complaint Chief Complaint  Patient presents with   Rash    HPI Kimberly Browning is a 47 y.o. female.  Here with persisting rash and itching She was originally seen 5/22, treated for possible scabies with ivermectin and kenalog.  Then seen by me on 5/27. I diagnosed as contact dermatitis. Was given prednisone that she did not take.  At that time she had refused injection.  She went to her primary care 5/31 who also thought contact dermatitis, gave zyrtec and hydrocortisone cream.  Went to PCP again today, still thought to be contact dermatitis, given kenalog  She is here because the steroid cream makes her skin burn. She wants something else.   Past Medical History:  Diagnosis Date   Anxiety    Bipolar disorder (HCC)    Depression    Dry skin    hands   Feeling of incomplete bladder emptying    GERD (gastroesophageal reflux disease)    Hypercholesteremia    Nocturia    Sleep apnea    Urge urinary incontinence    Vitamin D deficiency    Wears contact lenses     Patient Active Problem List   Diagnosis Date Noted   Contact dermatitis 11/05/2022   Ingrown toenail without infection 05/26/2022   New daily persistent headache 12/10/2021   Tension headache 09/21/2021   Weight loss 09/21/2021   Vaginal itching 08/07/2021   Loose stools 08/07/2021   Chest wall asymmetry 04/27/2021   General counselling and advice on contraception 04/07/2021   Diarrhea 02/25/2021   Overweight (BMI 25.0-29.9) 12/01/2020   Globus sensation 12/01/2020   Dysphagia 11/25/2020   Vaginal discharge 11/20/2020   Abdominal wall pain in flank 08/27/2020   Symptomatic mammary hypertrophy 08/15/2020   Elevated serum creatinine 07/17/2020   IUD contraception 03/18/2020   Onychomycosis of left great toe 10/24/2019   Obesity (BMI 30.0-34.9) 08/24/2019   Depression with anxiety    Gastroesophageal  reflux disease without esophagitis 01/29/2016   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   Acne 04/14/2012   History of tobacco use 06/09/2009   History of molar pregnancy, antepartum 12/12/2008   INSOMNIA 12/11/2008   HERPES LABIALIS 10/10/2007   Hyperlipidemia 01/03/2007   Urinary incontinence, mixed 11/25/2006   BIPOLAR DISORDER 08/04/2006   Seborrheic dermatitis 08/04/2006    Past Surgical History:  Procedure Laterality Date   BREAST REDUCTION SURGERY  05/10/2011   Procedure: MAMMARY REDUCTION BILATERAL (BREAST);  Surgeon: Rosalio Macadamia;  Location: [facility];  Service: Plastics;  Laterality: Bilateral;  bilateral breast reduction   DILATION AND EVACUATION  12/26/2008   x 2   INTERSTIM IMPLANT PLACEMENT N/A 07/09/2014   Procedure: Leane Platt IMPLANT FIRST STAGE;  Surgeon: Martina Sinner, MD;  Location: [facility];  Service: Urology;  Laterality: N/A;   INTERSTIM IMPLANT PLACEMENT N/A 07/09/2014   Procedure: Leane Platt IMPLANT SECOND STAGE;  Surgeon: Martina Sinner, MD;  Location: [facility];  Service: Urology;  Laterality: N/A;   INTRAUTERINE DEVICE (IUD) INSERTION  sept 2011   mirena   WISDOM TOOTH EXTRACTION  as a teenager    OB History   No obstetric history on file.      Home Medications    Prior to Admission medications   Medication Sig Start Date End Date Taking? Authorizing Provider  amantadine (SYMMETREL) 100 MG capsule  Take 100 mg by mouth daily. 01/02/20  Yes [provider]  ammonium lactate (AMLACTIN) 12 % cream Apply 1 Application topically as needed for dry skin. 08/31/22  Yes McDonald, Rachelle Hora, DPM  cetirizine (ZYRTEC ALLERGY) 10 MG tablet Take 1 tablet (10 mg total) by mouth daily. 11/05/22  Yes Ganta, Anupa, DO  diclofenac (VOLTAREN) 75 MG EC tablet Take 1 tablet by mouth 2 (two) times daily.   Yes [provider]  diphenhydrAMINE (BENADRYL) 2 % cream Apply topically 3 (three) times daily as  needed for itching. 11/11/22  Yes Breckan Cafiero, PA-C  doxepin (SINEQUAN) 150 MG capsule TAKE 2 CAPSULES BY MOUTH 45 MINUTES BEFORE BED.   Yes [provider]  hydrOXYzine (ATARAX) 50 MG tablet SMARTSIG:1 Tablet(s) By Mouth Morning-Night 08/17/22  Yes [provider]  imipramine (TOFRANIL) 25 MG tablet Take 25 mg by mouth at bedtime. 08/17/22  Yes [provider]  lamoTRIgine (LAMICTAL) 200 MG tablet Take 400 mg by mouth daily. 08/02/20  Yes [provider]  meclizine (ANTIVERT) 25 MG tablet    Yes [provider]  metFORMIN (GLUCOPHAGE-XR) 750 MG 24 hr tablet    Yes [provider]  mirtazapine (REMERON) 30 MG tablet Take 30 mg by mouth at bedtime. 01/15/20  Yes [provider]  ranitidine (ZANTAC) 300 MG tablet Take 1 tablet by mouth at bedtime.   Yes [provider]  rOPINIRole (REQUIP) 0.25 MG tablet    Yes [provider]  SUMAtriptan (IMITREX) 25 MG tablet Take 1 tablet (25 mg total) by mouth every 2 (two) hours as needed for migraine or headache. May repeat in 2 hours if headache persists or recurs. 12/09/21  Yes Simmons-Robinson, Makiera, MD  temazepam (RESTORIL) 15 MG capsule Take 3 capsules by mouth daily. 07/28/20  Yes [provider]    Family History Family History  Adopted: Yes  Problem Relation Age of Onset   Headache Neg Hx    Migraines Neg Hx     Social History Social History   Tobacco Use   Smoking status: Former    Packs/day: 0.25    Years: 19.00    Additional pack years: 0.00    Total pack years: 4.75    Types: Cigarettes    Quit date: 09/23/2019    Years since quitting: 3.1    Passive exposure: Past   Smokeless tobacco: Never   Tobacco comments:     1 pp3d  Vaping Use   Vaping Use: Some days  Substance Use Topics   Alcohol use: Not Currently   Drug use: No     Allergies   Geodon [ziprasidone hydrochloride], Hibiclens [chlorhexidine], Sulfamethoxazole-trimethoprim,  Doxycycline, Gabapentin, Other, Penicillins, Promethazine, and Depakote [divalproex sodium]   Review of Systems Review of Systems  Skin:  Positive for rash.   As per HPI  Physical Exam Triage Vital Signs ED Triage Vitals  Enc Vitals Group     BP 11/11/22 1933 (!) 115/56     Pulse Rate 11/11/22 1933 67     Resp 11/11/22 1933 18     Temp 11/11/22 1933 98.8 F (37.1 C)     Temp Source 11/11/22 1933 Oral     SpO2 11/11/22 1933 98 %     Weight --      Height --      Head Circumference --      Peak Flow --      Pain Score 11/11/22 1932 0     Pain Loc --  Pain Edu? --      Excl. in GC? --    No data found.  Updated Vital Signs BP (!) 115/56 (BP Location: Right Arm)   Pulse 67   Temp 98.8 F (37.1 C) (Oral)   Resp 18   LMP 07/31/2020   SpO2 98%    Physical Exam Vitals and nursing note reviewed.  Constitutional:      General: She is not in acute distress.    Appearance: Normal appearance.  HENT:     Mouth/Throat:     Mouth: Mucous membranes are moist.     Pharynx: Oropharynx is clear. No posterior oropharyngeal erythema.  Eyes:     Conjunctiva/sclera: Conjunctivae normal.     Pupils: Pupils are equal, round, and reactive to light.  Cardiovascular:     Rate and Rhythm: Normal rate and regular rhythm.     Pulses: Normal pulses.     Heart sounds: Normal heart sounds.  Pulmonary:     Effort: Pulmonary effort is normal. No respiratory distress.     Breath sounds: Normal breath sounds. No wheezing.  Abdominal:     General: Bowel sounds are normal.     Tenderness: There is no abdominal tenderness.  Musculoskeletal:        General: Normal range of motion.     Cervical back: Normal range of motion.  Skin:    Findings: Rash present.     Comments: Several excoriated areas of skin. Bumps on bilat arms and legs. Actually appear to be fewer than when I saw her last week  Neurological:     Mental Status: She is alert and oriented to person, place, and time.      UC Treatments / Results  Labs (all labs ordered are listed, but only abnormal results are displayed) Labs Reviewed - No data to display  EKG  Radiology No results found.  Procedures Procedures (including critical care time)  Medications Ordered in UC Medications  methylPREDNISolone sodium succinate (SOLU-MEDROL) 125 mg/2 mL injection 60 mg (60 mg Intramuscular Given 11/11/22 2001)    Initial Impression / Assessment and Plan / UC Course  I have reviewed the triage vital signs and the nursing notes.  Pertinent labs & imaging results that were available during my care of the patient were reviewed by me and considered in my medical decision making (see chart for details).  Discussed at this time I recommend we try intramuscular steroid.  She has tried several topical creams and pills by mouth that she did not like.  She is itching in the clinic. IM Solu-Medrol given She has not liked any of the steroid creams given and they cause her skin to burn.  I recommend trying plain topical Benadryl. Provided with 5 different dermatology clinics.  Recommend to call each one and see who will see her soonest  Final Clinical Impressions(s) / UC Diagnoses   Final diagnoses:  Contact dermatitis, unspecified contact dermatitis type, unspecified trigger  Pruritic rash     Discharge Instructions      The steroid injection given today will help with itching. It can take 30-minutes for this to start working. Then it should provided several hours of itch relief  I recommend to use topical benadryl cream. This is NOT a steroid and should help sooth the skin.  Please call the dermatology clinics to see who has soonest appointment. You will need to follow up with them if this is persisting.     ED Prescriptions  Medication Sig Dispense Auth. Provider   diphenhydrAMINE (BENADRYL) 2 % cream Apply topically 3 (three) times daily as needed for itching. 30 g Jariana Shumard, Lurena Joiner, PA-C       PDMP not reviewed this encounter.   Donterius Filley, Ray Church 11/11/22 2005

## 2022-11-11 NOTE — Progress Notes (Signed)
ti   SUBJECTIVE:   CHIEF COMPLAINT / HPI:   Patient is a 47 year old female presenting for follow-up of rash Rash started 3 weeks ago from the toes   Gradually progressed to thighs, pelvic, abdomen, arm and head Patient was recently seen in Urgent care twice for diffused rash Was initially treated with IV nystatin for concerns of scabies. After no improvement she was given 21 days dose of tapered prednisone. Patient reported short lived mild improvement with prednisone. Previous reluctant to use any topical medication Currently not taking any oral medication other than hydroxyzine and lamotrigine. PERTINENT  PMH / PSH: Reviewed   OBJECTIVE:   BP (!) 103/53   Pulse 72   Ht 5\' 3"  (1.6 m)   Wt 122 lb (55.3 kg)   LMP 07/31/2020   SpO2 100%   BMI 21.61 kg/m    Physical Exam General: Alert, well appearing, NAD Cardiovascular: RRR, No Murmurs, Normal S2/S2 Respiratory: CTAB, No wheezing or Rales Skin: Multiple diffused erythematous rash with some excoriations located mostly on the abdomen, hands, and leg. Erythematous, raise with wheals appearing rash located on the inner thighs       ASSESSMENT/PLAN:   Contact dermatitis Severe pruritic rash  and exam finding highly suspicious of contact dermatitis.  On exam has diffuse erythematous rash suspicious of contact dermatitis.  Inner thigh lesion most consistent with urticaria.  Suspect rash are worsen by constant itchiness from patient. Distribution of rash are mostly seen on areas reachable by patient.  Few to no lesions on the back.  Patient is currently on hydroxyzine. -Rx triamcinolone 0.1% ointment to be applied twice daily -Continue hydroxyzine as prescribed -Patient to avoid constant itchiness on the affected area -Decrease son exposure -Follow up in 1-2 weeks      Jerre Simon, MD [facility]

## 2022-11-11 NOTE — Discharge Instructions (Addendum)
The steroid injection given today will help with itching. It can take 30-minutes for this to start working. Then it should provided several hours of itch relief  I recommend to use topical benadryl cream. This is NOT a steroid and should help sooth the skin.  Please call the dermatology clinics to see who has soonest appointment. You will need to follow up with them if this is persisting.

## 2022-11-15 ENCOUNTER — Telehealth: Payer: Self-pay

## 2022-11-15 NOTE — Telephone Encounter (Signed)
Patient calls nurse line in regards to Dermatology referral.   She reports she has an apt in August, however does not know where. She is requesting to be seen somewhere sooner.   Will forward to the referral coordinator.

## 2022-11-16 NOTE — Telephone Encounter (Signed)
LM for patient with information on upcoming appt with [place] [facility].  She will need to keep this appointment as a 2 month wait is a good time frame for this speciality.  I did offer in the voicemail for patient to see derm clinic here and that she can call back to schedule.  Kymberlie Brazeau [facility] Address: [address] [place], [place] [address] Phone: [phone]

## 2022-11-23 ENCOUNTER — Ambulatory Visit (INDEPENDENT_AMBULATORY_CARE_PROVIDER_SITE_OTHER): Payer: Medicare Other

## 2022-11-23 DIAGNOSIS — Z0000 Encounter for general adult medical examination without abnormal findings: Secondary | ICD-10-CM | POA: Diagnosis not present

## 2022-11-23 NOTE — Patient Instructions (Signed)

## 2022-11-23 NOTE — Progress Notes (Signed)
Subjective:   Kimberly Browning is a 47 y.o. female who presents for an Initial Medicare Annual Wellness Visit.  I connected with  Kimberly Browning on 11/23/22 by a audio enabled telemedicine application and verified that I am speaking with the correct person using two identifiers.  Patient Location: Home  Provider Location: Home Office  I discussed the limitations of evaluation and management by telemedicine. The patient expressed understanding and agreed to proceed.    Review of Systems    Per HPI unless specifically indicated below.        Objective:    Today's Vitals   11/23/22 1056  PainSc: 0-No pain   There is no height or weight on file to calculate BMI.     11/11/2022   11:52 AM 11/05/2022    1:49 PM 07/16/2022    1:56 PM 07/09/2022    2:04 PM 07/01/2022    1:35 PM 06/24/2022   10:46 AM 12/09/2021   11:15 AM  Advanced Directives  Does Patient Have a Medical Advance Directive? No No No No No No No  Would patient like information on creating a medical advance directive?   No - Patient declined No - Patient declined No - Patient declined  No - Patient declined    Current Medications (verified) Outpatient Encounter Medications as of 11/23/2022  Medication Sig   amantadine (SYMMETREL) 100 MG capsule Take 100 mg by mouth daily.   doxepin (SINEQUAN) 150 MG capsule TAKE 2 CAPSULES BY MOUTH 45 MINUTES BEFORE BED.   hydrOXYzine (ATARAX) 50 MG tablet SMARTSIG:1 Tablet(s) By Mouth Morning-Night   mirtazapine (REMERON) 30 MG tablet Take 30 mg by mouth at bedtime.   temazepam (RESTORIL) 15 MG capsule Take 3 capsules by mouth daily.   ammonium lactate (AMLACTIN) 12 % cream Apply 1 Application topically as needed for dry skin. (Patient not taking: Reported on 11/23/2022)   cetirizine (ZYRTEC ALLERGY) 10 MG tablet Take 1 tablet (10 mg total) by mouth daily. (Patient not taking: Reported on 11/23/2022)   diclofenac (VOLTAREN) 75 MG EC tablet Take 1 tablet by mouth 2 (two) times daily.  (Patient not taking: Reported on 11/23/2022)   diphenhydrAMINE (BENADRYL) 2 % cream Apply topically 3 (three) times daily as needed for itching. (Patient not taking: Reported on 11/23/2022)   imipramine (TOFRANIL) 25 MG tablet Take 25 mg by mouth at bedtime. (Patient not taking: Reported on 11/23/2022)   lamoTRIgine (LAMICTAL) 200 MG tablet Take 400 mg by mouth daily. (Patient not taking: Reported on 11/23/2022)   meclizine (ANTIVERT) 25 MG tablet  (Patient not taking: Reported on 11/23/2022)   metFORMIN (GLUCOPHAGE-XR) 750 MG 24 hr tablet  (Patient not taking: Reported on 11/23/2022)   ranitidine (ZANTAC) 300 MG tablet Take 1 tablet by mouth at bedtime. (Patient not taking: Reported on 11/23/2022)   rOPINIRole (REQUIP) 0.25 MG tablet  (Patient not taking: Reported on 11/23/2022)   SUMAtriptan (IMITREX) 25 MG tablet Take 1 tablet (25 mg total) by mouth every 2 (two) hours as needed for migraine or headache. May repeat in 2 hours if headache persists or recurs. (Patient not taking: Reported on 11/23/2022)   No facility-administered encounter medications on file as of 11/23/2022.    Allergies (verified) Geodon [ziprasidone hydrochloride], Hibiclens [chlorhexidine], Sulfamethoxazole-trimethoprim, Doxycycline, Gabapentin, Other, Penicillins, Promethazine, and Depakote [divalproex sodium]   History: Past Medical History:  Diagnosis Date   Anxiety    Bipolar disorder (HCC)    Depression    Dry skin    hands  Feeling of incomplete bladder emptying    GERD (gastroesophageal reflux disease)    Hypercholesteremia    Nocturia    Sleep apnea    Urge urinary incontinence    Vitamin D deficiency    Wears contact lenses    Past Surgical History:  Procedure Laterality Date   BREAST REDUCTION SURGERY  05/10/2011   Procedure: MAMMARY REDUCTION BILATERAL (BREAST);  Surgeon: Kimberly Browning;  Location: [facility];  Service: Plastics;  Laterality: Bilateral;  bilateral breast reduction    DILATION AND EVACUATION  12/26/2008   x 2   INTERSTIM IMPLANT PLACEMENT N/A 07/09/2014   Procedure: Kimberly Browning IMPLANT FIRST STAGE;  Surgeon: Kimberly Sinner, MD;  Location: [facility];  Service: Urology;  Laterality: N/A;   INTERSTIM IMPLANT PLACEMENT N/A 07/09/2014   Procedure: Kimberly Browning IMPLANT SECOND STAGE;  Surgeon: Kimberly Sinner, MD;  Location: [facility];  Service: Urology;  Laterality: N/A;   INTRAUTERINE DEVICE (IUD) INSERTION  sept 2011   mirena   WISDOM TOOTH EXTRACTION  as a teenager   Family History  Adopted: Yes  Problem Relation Age of Onset   Headache Neg Hx    Migraines Neg Hx    Social History   Socioeconomic History   Marital status: Divorced    Spouse name: Not on file   Number of children: 1   Years of education: Not on file   Highest education level: Not on file  Occupational History   Occupation: disablitiy  Tobacco Use   Smoking status: Former    Packs/day: 0.25    Years: 19.00    Additional pack years: 0.00    Total pack years: 4.75    Types: Cigarettes    Quit date: 09/23/2019    Years since quitting: 3.1    Passive exposure: Past   Smokeless tobacco: Never   Tobacco comments:     1 pp3d  Vaping Use   Vaping Use: Some days   Substances: Nicotine, Flavoring  Substance and Sexual Activity   Alcohol use: Not Currently   Drug use: No   Sexual activity: Yes    Birth control/protection: None  Other Topics Concern   Not on file  Social History Narrative   Caffeine: cup coffee daily   Working: on disability    Education: 12 th grade   Live alone, one daughter.    Social Determinants of Health   Financial Resource Strain: Low Risk  (11/23/2022)   Overall Financial Resource Strain (CARDIA)    Difficulty of Paying Living Expenses: Not hard at all  Food Insecurity: No Food Insecurity (11/23/2022)   Hunger Vital Sign    Worried About Running Out of Food in the Last Year: Never true    Ran Out of Food in the  Last Year: Never true  Transportation Needs: No Transportation Needs (11/23/2022)   PRAPARE - Administrator, Civil Service (Medical): No    Lack of Transportation (Non-Medical): No  Physical Activity: Inactive (11/23/2022)   Exercise Vital Sign    Days of Exercise per Week: 0 days    Minutes of Exercise per Session: 0 min  Stress: Stress Concern Present (11/23/2022)   [employer] of Occupational Health - Occupational Stress Questionnaire    Feeling of Stress : To some extent  Social Connections: Socially Isolated (11/23/2022)   Social Connection and Isolation Panel [NHANES]    Frequency of Communication with Friends and Family: More than three times a week  Frequency of Social Gatherings with Friends and Family: More than three times a week    Attends Religious Services: Never    Database administrator or Organizations: No    Attends Engineer, structural: Never    Marital Status: Never married    Tobacco Counseling Counseling given: Not Answered Tobacco comments:  1 pp3d   Clinical Intake:  Pre-visit preparation completed: No  Pain : No/denies pain Pain Score: 0-No pain     Nutritional Status: BMI of 19-24  Normal Nutritional Risks: None Diabetes: No  How often do you need to have someone help you when you read instructions, pamphlets, or other written materials from your doctor or pharmacy?: 1 - Never  Interpreter Needed?: No  Information entered by :: Laurel Dimmer, CMA   Activities of Daily Living    11/23/2022   10:54 AM  In your present state of health, do you have any difficulty performing the following activities:  Hearing? 0  Vision? 1  [facility]  Difficulty concentrating or making decisions? 1  Walking or climbing stairs? 1  Dressing or bathing? 0  Doing errands, shopping? 0    Patient Care Team: Sabino Dick, DO as PCP - General (Family Medicine) Pricilla Riffle, MD as PCP - Cardiology  (Cardiology)  Indicate any recent Medical Services you may have received from other than [facility] providers in the past year (date may be approximate).     Assessment:   This is a routine wellness examination for Marlin.   Hearing/Vision screen Denies any hearing issues. Denies any change to her vision.Annual Eye Exam.   Dietary issues and exercise activities discussed: Current Exercise Habits: The patient does not participate in regular exercise at present, Exercise limited by: None identified   Goals Addressed   None    Depression Screen    11/23/2022   11:00 AM 11/23/2022   10:53 AM 11/11/2022   11:52 AM 07/16/2022    1:56 PM 07/09/2022    2:05 PM 07/01/2022    1:35 PM 06/24/2022   10:47 AM  PHQ 2/9 Scores  PHQ - 2 Score 0 0  1 4 1 4   PHQ- 9 Score 1   2 7 7 10   Exception Documentation   Patient refusal        Fall Risk    11/23/2022   10:53 AM 10/08/2021    1:34 PM 09/21/2021    2:07 PM 08/07/2021   10:38 AM 04/07/2021    2:50 PM  Fall Risk   Falls in the past year? 0 0 0 0 0  Number falls in past yr: 0 0 0 0 0  Injury with Fall? 0 0 0 0 0  Risk for fall due to : No Fall Risks      Follow up Falls evaluation completed        MEDICARE RISK AT HOME: No medicare risk noted.    TIMED UP AND GO:  Was the test performed? No    Cognitive Function:        11/23/2022   10:58 AM  6CIT Screen  What Year? 0 points  What month? 0 points  What time? 0 points  Count back from 20 0 points  Months in reverse 0 points  Repeat phrase 2 points  Total Score 2 points    Immunizations Immunization History  Administered Date(s) Administered   Influenza Split 03/05/2010, 04/02/2011, 04/17/2012, 03/15/2013   Influenza Whole 04/06/2007, 03/26/2009   Influenza,inj,Quad PF,6+ Mos  03/06/2015, 05/14/2016, 04/19/2017   Influenza,inj,quad, With Preservative 03/12/2014   PFIZER(Purple Top)SARS-COV-2 Vaccination 09/06/2019, 10/01/2019, 05/23/2020   Pneumococcal Polysaccharide-23  03/06/2015   Td 06/07/1997   Tdap 04/02/2011    TDAP status: Due, Education has been provided regarding the importance of this vaccine. Advised may receive this vaccine at local pharmacy or Health Dept. Aware to provide a copy of the vaccination record if obtained from local pharmacy or Health Dept. Verbalized acceptance and understanding.  Flu Vaccine status: Up to date  Pneumococcal vaccine status: Up to date  Covid-19 vaccine status: Information provided on how to obtain vaccines.   Qualifies for Shingles Vaccine? No      Screening Tests Health Maintenance  Topic Date Due   DTaP/Tdap/Td (3 - Td or Tdap) 04/01/2021   COVID-19 Vaccine (4 - 2023-24 season) 02/05/2022   INFLUENZA VACCINE  01/06/2023   Medicare Annual Wellness (AWV)  11/23/2023   PAP SMEAR-Modifier  10/08/2024   Colonoscopy  05/30/2031   Hepatitis C Screening  Completed   HIV Screening  Completed   HPV VACCINES  Aged Out    Health Maintenance  Health Maintenance Due  Topic Date Due   DTaP/Tdap/Td (3 - Td or Tdap) 04/01/2021   COVID-19 Vaccine (4 - 2023-24 season) 02/05/2022    Colorectal cancer screening: Type of screening: Colonoscopy. Completed 05/29/21. Repeat every 10 years  Mammogram status: 08/22/20 completed  Bone Density Scan: not applicable   Lung Cancer Screening: (Low Dose CT Chest recommended if Age 79-80 years, 20 pack-year currently smoking OR have quit w/in 15years.) does not qualify.   Lung Cancer Screening Referral: No applicable   Additional Screening:  Hepatitis C Screening: does qualify; Completed 07/16/2020  Vision Screening: Recommended annual ophthalmology exams for early detection of glaucoma and other disorders of the eye. Is the patient up to date with their annual eye exam?  Yes  Who is the provider or what is the name of the office in which the patient attends annual eye exams? [facility]  If pt is not established with a provider, would they like to be  referred to a provider to establish care? No .   Dental Screening: Recommended annual dental exams for proper oral hygiene   Community Resource Referral / Chronic Care Management: CRR required this visit?  No   CCM required this visit?  No     Plan:     I have personally reviewed and noted the following in the patient's chart:   Medical and social history Use of alcohol, tobacco or illicit drugs  Current medications and supplements including opioid prescriptions. Patient is not currently taking opioid prescriptions. Functional ability and status Nutritional status Physical activity Advanced directives List of other physicians Hospitalizations, surgeries, and ER visits in previous 12 months Vitals Screenings to include cognitive, depression, and falls Referrals and appointments  In addition, I have reviewed and discussed with patient certain preventive protocols, quality metrics, and best practice recommendations. A written personalized care plan for preventive services as well as general preventive health recommendations were provided to patient.     Lonna Cobb, CMA   11/23/2022   After Visit Summary: (MyChart) Due to this being a telephonic visit, the after visit summary with patients personalized plan was offered to patient via MyChart   Nurse Notes: Approximately 30 minute Non-Face -To-Face Medicare Wellness Visit

## 2022-11-25 ENCOUNTER — Ambulatory Visit (INDEPENDENT_AMBULATORY_CARE_PROVIDER_SITE_OTHER): Payer: Medicare Other | Admitting: Student

## 2022-11-25 VITALS — BP 112/67 | HR 69 | Ht 63.0 in | Wt 122.4 lb

## 2022-11-25 DIAGNOSIS — L249 Irritant contact dermatitis, unspecified cause: Secondary | ICD-10-CM | POA: Diagnosis not present

## 2022-11-25 MED ORDER — BETAMETHASONE VALERATE 0.1 % EX CREA
TOPICAL_CREAM | Freq: Two times a day (BID) | CUTANEOUS | 0 refills | Status: DC
Start: 2022-11-25 — End: 2023-02-18

## 2022-11-25 NOTE — Assessment & Plan Note (Signed)
Rash is improved. On exam has two distinct rash, one is more consistent with contact dermatitis. Suspect possible scabies with the pustular rash. Will switch her topical from triamcinolone to betamethasone given patients complain. 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 -Rx Betamethasone cream 0.1% -Advised use of loose clothing to minimize sweating -Follow up with PCP about menopause and hot flashes -Will consider Ivermectin for scabies if rash continues -Encouraged patient to wash all her beddings

## 2022-11-25 NOTE — Patient Instructions (Addendum)
It was wonderful to meet you today. Thank you for allowing me to be a part of your care. Below is a short summary of what we discussed at your visit today:  Glad to see some of the rashes are cleared.  I have sent in prescription for a different steroid cream. You will use it once daily for 5 days at a time.  I recommend you wear more loose cloths as this could help prevent the sweating that usually trigger your itchiness.  I suspect you could possible have scabies. If the sterid cream does not work we will treat you again for scabies.  You will need to wash all your bedding including blankets   If you have any questions or concerns, please do not hesitate to contact us via phone or MyChart message.   Jerre Simon, MD [facility]

## 2022-11-25 NOTE — Progress Notes (Signed)
    SUBJECTIVE:   CHIEF COMPLAINT / HPI:   47 year old female poresentiung for follow up for rash Reports severe generalized purities with rash Rash onset was about 1-2 months She thinks the sweatiness which worsen her itchiness is attributed to her hot flashes from menopause Has tried triamcinolone cream which she said made the itchiness worse Was given Steroid shot at Urgent care. Report feeling manic, restlessness and difficulty sleeping.  PERTINENT  PMH / PSH: Reviewed  OBJECTIVE:   BP 112/67   Pulse 69   Ht 5\' 3"  (1.6 m)   Wt 122 lb 6.4 oz (55.5 kg)   LMP 07/31/2020   SpO2 100%   BMI 21.68 kg/m    Physical Exam General: Alert, well appearing, NAD Cardiovascular: RRR, No Murmurs, Normal S2/S2 Respiratory: CTAB, No wheezing or Rales Skin: Malar erythematous rash in the inner thigh bilaterally. Linear pustular rash present on the right inner thigh. ASSESSMENT/PLAN:   Contact dermatitis Rash is improved. On exam has two distinct rash, one is more consistent with contact dermatitis. Suspect possible scabies with the pustular rash. Will switch her topical from triamcinolone to betamethasone given patients complain. 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 -Rx Betamethasone cream 0.1% -Advised use of loose clothing to minimize sweating -Follow up with PCP about menopause and hot flashes -Will consider Ivermectin for scabies if rash continues -Encouraged patient to wash all her beddings     Jerre Simon, MD [facility]

## 2022-12-01 ENCOUNTER — Other Ambulatory Visit: Payer: Self-pay

## 2022-12-01 NOTE — Telephone Encounter (Signed)
Patient calls nurse line requesting a refill on her nausea medication.    Will send to PCP.

## 2022-12-02 MED ORDER — MECLIZINE HCL 25 MG PO TABS: ORAL_TABLET | ORAL | 0 refills | Status: DC

## 2022-12-17 ENCOUNTER — Ambulatory Visit: Payer: Medicare Other | Admitting: Family Medicine

## 2022-12-31 ENCOUNTER — Other Ambulatory Visit: Payer: Self-pay

## 2022-12-31 ENCOUNTER — Ambulatory Visit: Payer: Medicare Other | Admitting: Family Medicine

## 2022-12-31 VITALS — BP 114/71 | HR 72 | Wt 124.6 lb

## 2022-12-31 DIAGNOSIS — Z713 Dietary counseling and surveillance: Secondary | ICD-10-CM | POA: Diagnosis not present

## 2022-12-31 DIAGNOSIS — Z131 Encounter for screening for diabetes mellitus: Secondary | ICD-10-CM

## 2022-12-31 LAB — POCT GLYCOSYLATED HEMOGLOBIN (HGB A1C): Hemoglobin A1C: 4.8 % (ref 4.0–5.6)

## 2022-12-31 NOTE — Patient Instructions (Signed)
It was wonderful to see you today!  Today we discussed your concerns about your eating habits.  I will check your A1c to to see if the increased sweets have changed your blood sugar but because there is no indication on your previous labs you may have to pay out-of-pocket for this test.  As we discussed, hunger is a normal symptom when your body is not getting the nutrition it needs.  You should try readymade foods such as chicken salad which are easy for you to chew and do not require preparation so that you are eating regular meals every day.  If you continue to experience increased hunger after eating regularly, can make an appointment to discuss your appetite with your primary care provider.  Please follow-up in about a month or sooner if you have any questions.  Thank you for choosing [facility].   Please call [phone] with any questions about today's appointment.   If you had blood work today, I will send you a MyChart message or a letter if results are normal. Otherwise, I will give you a call.   If you had a referral placed, they will call you to set up an appointment. Please give [place] a call if you don't hear back in the next 2 weeks.   If you need additional refills before your next appointment, please call your pharmacy first.   Gerrit Heck, DO Family Medicine

## 2022-12-31 NOTE — Progress Notes (Signed)
    SUBJECTIVE:   CHIEF COMPLAINT / HPI:   Kimberly Browning is a 47 year old woman here today to discuss appetite changes and weight concerns.  Her weight in office 6/20 was 122 pounds.   Today she is very concerned that she is eating too many sweets and it has sected her blood sugar.  She denies any increased urination or increased thirst.  She states that she eats 2 boxes of cookies per month and drinks multiple eyes coffees per week.  She denies any regular meals other than occasional meals out with her sister.  She has a dog which she walks regularly 3 times per day.  She reports occasional yogurt consumption but finds herself turning back to the cookies as her primary source of food.  PERTINENT  PMH / PSH: None  OBJECTIVE:   BP 114/71   Pulse 72   Wt 124 lb 9.6 oz (56.5 kg)   LMP 07/31/2020   SpO2 100%   BMI 22.07 kg/m   General: Alert and oriented, no apparent distress.  ASSESSMENT/PLAN:   Dietary counseling Discussed with patient that likely her hunger is due to inadequate nutrition.  Discussed adding readymade foods such as chicken salad to her diet as she does not like to cook and has difficulty chewing certain foods because of her dentures.  Advised patient to try and get 1 regular meal in per day follow-up with her primary care doctor to further discuss.  Ordered hemoglobin A1c for reassurance as patient is very concerned that she is diabetic.   Gerrit Heck, DO [facility]

## 2022-12-31 NOTE — Assessment & Plan Note (Addendum)
Discussed with patient that likely her hunger is due to inadequate nutrition.  Discussed adding readymade foods such as chicken salad to her diet as she does not like to cook and has difficulty chewing certain foods because of her dentures.  Advised patient to try and get 1 regular meal in per day follow-up with her primary care doctor to further discuss.  Ordered hemoglobin A1c for reassurance as patient is very concerned that she is diabetic.

## 2023-01-11 ENCOUNTER — Ambulatory Visit: Payer: Medicare Other | Admitting: Family Medicine

## 2023-01-16 NOTE — Progress Notes (Deleted)
    SUBJECTIVE:   CHIEF COMPLAINT / HPI: check iron levels  ***  PERTINENT  PMH / PSH: ***  OBJECTIVE:   LMP 07/31/2020   ***  ASSESSMENT/PLAN:   No problem-specific Assessment & Plan notes found for this encounter.     Kimberly Curling, DO [facility]

## 2023-01-17 ENCOUNTER — Ambulatory Visit: Payer: Medicare Other | Admitting: Family Medicine

## 2023-01-24 ENCOUNTER — Ambulatory Visit: Payer: Medicare Other | Admitting: Dermatology

## 2023-01-25 ENCOUNTER — Telehealth: Payer: Self-pay

## 2023-01-25 NOTE — Telephone Encounter (Signed)
Patient calls nurse line reporting vaginal bleeding.   She reports a small amount when she wiped. She denies any abdominal cramping or excessive bleeding.   She reports she went through menopause already and should not be starting a period.   Advised she would need an evaluation. She reports she has an apt on 8/23.  Precautions discussed in the meantime.

## 2023-01-27 NOTE — Progress Notes (Signed)
    SUBJECTIVE:   CHIEF COMPLAINT / HPI: vaginal bleeding x4 days s/p menopause and weight loss  Patient reports that she has been having vaginal bleeding for the past four days and experiencing some lower back pain for the past two days after sleeping. LMP about 3 years ago. She reports that when she wipes after using the bathroom she had some bleeding, however she doesn't think it would fill a whole pad. Denies any abdominal pain or cramping. She hasn't had sexual intercourse over the past year.   Denies any dysuria, increased frequency or urgency.   PERTINENT  PMH / PSH: GERD, bipolar disorder, contact dermatitis  OBJECTIVE:   BP 108/68   Pulse 68   Ht 5\' 3"  (1.6 m)   Wt 124 lb 9.6 oz (56.5 kg)   LMP 07/31/2020   SpO2 100%   BMI 22.07 kg/m   General: NAD, awake and alert HEENT: Normocephalic, atraumatic. Conjunctiva normal. No nasal discharge. Cardiovascular: RRR. No M/R/G Respiratory: CTAB, normal WOB on RA. No wheezing, crackles, rhonchi, or diminished breath sounds. Abdomen: Soft, non-tender, non-distended. Bowel sounds normoactive Extremities: No BLE edema, no deformities or significant joint findings. No CVA tenderness. TTP over lower back, but no radiation Skin: Warm and dry. Neuro: A&Ox3. No focal neurological deficits. F GU: Normally developed genitalia with no external lesions or eruptions. Some dark brown bleeding noted externally and off the cervix. Vagina and cervix show no lesions, inflammation, or tenderness.  ASSESSMENT/PLAN:   Postmenopausal bleeding Vaginal bleeding for the past 4 days, last menstrual period about 3 years ago.  Does not fill up a whole pad, but endorses bleeding every time she wipes.  Pelvic exam revealed a small amount of dark brown blood internally and externally. Wet prep and hCG negative. - Discussed that postmenopausal bleeding arises some concern and further workup must be conducted.  Patient understood. - Called patient to clarify that  she requires two different appointments one for pelvic US and one for colpo clinic after CMA shared that the patient expressed some confusion. We also discussed her lab results being negative for pregnancy test, wet prep, and UA was WNL except for some blood likely from her vagina - Pelvic ultrasound which was scheduled for 02/02/23 at 4 PM - Colpo clinic appointment 02/03/23 at 11:30 AM for further evaluation and endometrial biopsy    [name], DO [facility]

## 2023-01-28 ENCOUNTER — Telehealth: Payer: Self-pay

## 2023-01-28 ENCOUNTER — Ambulatory Visit: Payer: Medicare Other | Admitting: Family Medicine

## 2023-01-28 VITALS — BP 108/68 | HR 68 | Ht 63.0 in | Wt 124.6 lb

## 2023-01-28 DIAGNOSIS — N950 Postmenopausal bleeding: Secondary | ICD-10-CM | POA: Diagnosis present

## 2023-01-28 LAB — POCT UA - MICROSCOPIC ONLY: WBC, Ur, HPF, POC: NONE SEEN (ref 0–5)

## 2023-01-28 LAB — POCT URINALYSIS DIP (MANUAL ENTRY)
Bilirubin, UA: NEGATIVE
Glucose, UA: NEGATIVE mg/dL
Ketones, POC UA: NEGATIVE mg/dL
Leukocytes, UA: NEGATIVE
Nitrite, UA: NEGATIVE
Protein Ur, POC: NEGATIVE mg/dL
Spec Grav, UA: 1.02 (ref 1.010–1.025)
Urobilinogen, UA: 0.2 E.U./dL
pH, UA: 6 (ref 5.0–8.0)

## 2023-01-28 LAB — POCT WET PREP (WET MOUNT)
Clue Cells Wet Prep Whiff POC: NEGATIVE
Trichomonas Wet Prep HPF POC: ABSENT

## 2023-01-28 LAB — POCT URINE PREGNANCY: Preg Test, Ur: NEGATIVE

## 2023-01-28 NOTE — Patient Instructions (Addendum)
It was great to see you today! Thank you for choosing [facility] for your primary care. Kimberly Browning was seen for vaginal bleeding x4 days.  Today we addressed: We did a pelvic exam and noticed that you do have some bleeding, we are worried that since you haven't had your period in 3 years it could be postmenopausal bleeding which requires further workup. We will get a pelvic ultrasound and schedule you next Thursday for an appointment in our colposcopy clinic.  Urinalysis and wet prep done today to assess for infection. If you start soaking through a pad an hour, please give [place] a call.  If you haven't already, sign up for My Chart to have easy access to your labs results, and communication with your primary care physician.  We are checking some labs today. If they are abnormal, I will call you. If they are normal, I will send you a MyChart message (if it is active) or a letter in the mail. If you do not hear about your labs in the next 2 weeks, please call the office.  You should return to our clinic Return in about 6 days (around 02/03/2023). Please arrive 15 minutes before your appointment to ensure smooth check in process.  We appreciate your efforts in making this happen.  Thank you for allowing me to participate in your care, [name], DO 01/28/2023, 12:24 PM PGY-1, [facility]

## 2023-01-28 NOTE — Telephone Encounter (Signed)
Called patient with information about upcoming ultrasound.  Patient has [facility] and will call to reschedule as the time does not work for her.  Glennie Hawk, CMA

## 2023-01-28 NOTE — Assessment & Plan Note (Addendum)
Vaginal bleeding for the past 4 days, last menstrual period about 3 years ago.  Does not fill up a whole pad, but endorses bleeding every time she wipes.  Pelvic exam revealed a small amount of dark brown blood internally and externally. Wet prep and hCG negative. - Discussed that postmenopausal bleeding arises some concern and further workup must be conducted.  Patient understood. - Called patient to clarify that she requires two different appointments one for pelvic US and one for colpo clinic after CMA shared that the patient expressed some confusion. We also discussed her lab results being negative for pregnancy test, wet prep, and UA was WNL except for some blood likely from her vagina - Pelvic ultrasound which was scheduled for 02/02/23 at 4 PM - Colpo clinic appointment 02/03/23 at 11:30 AM for further evaluation and endometrial biopsy

## 2023-02-02 ENCOUNTER — Ambulatory Visit (HOSPITAL_COMMUNITY): Payer: Medicare Other

## 2023-02-03 ENCOUNTER — Ambulatory Visit (HOSPITAL_COMMUNITY)
Admission: RE | Admit: 2023-02-03 | Discharge: 2023-02-03 | Disposition: A | Discharge status: Home / Self Care | Payer: Medicare Other | Source: Ambulatory Visit | Attending: Family Medicine | Admitting: Family Medicine

## 2023-02-03 ENCOUNTER — Ambulatory Visit: Payer: Medicare Other | Admitting: Family Medicine

## 2023-02-03 ENCOUNTER — Other Ambulatory Visit (HOSPITAL_COMMUNITY)
Admission: RE | Admit: 2023-02-03 | Discharge: 2023-02-03 | Disposition: A | Discharge status: Home / Self Care | Payer: Medicare Other | Source: Ambulatory Visit | Attending: Family Medicine | Admitting: Family Medicine

## 2023-02-03 VITALS — BP 108/60 | HR 61 | Ht 63.0 in | Wt 125.4 lb

## 2023-02-03 DIAGNOSIS — N950 Postmenopausal bleeding: Secondary | ICD-10-CM | POA: Insufficient documentation

## 2023-02-03 NOTE — Patient Instructions (Addendum)
It was great to see you! Thank you for allowing me to participate in your care! We performed and completed your endometrial biopsy today.  You may have a little bit of vaginal spotting and some vaginal cramping in the next 24 hours.  You should not have a lot of bleeding and you should not have a lot of pain.  If any of those occur, please let us know as soon as possible.  I will contact you early next week with the results of this biopsy as well as results of your ultrasound that you had done today and we can discuss any next needed steps.

## 2023-02-03 NOTE — Progress Notes (Signed)
    SUBJECTIVE:   CHIEF COMPLAINT / HPI: post menopausal bleeding  Vaginal bleeding Saw Dr. Fatima Blank 01/28/23, 3-4 days vaginal bleeding, last LMP 3 years ago Last pap 05/23 negative HPV, NILM Unsure of when last menstrual cycle was  Still has hot flashes, had them for years, but only has them in the summer Firmly denies any vaginal bleeding for more than 2 years prior to this  PERTINENT  PMH / PSH: Postmenopausal bleeding, Hx of Genital herpes, Hx of Molar pregnancy  OBJECTIVE:   BP 108/60   Pulse 61   Ht 5\' 3"  (1.6 m)   Wt 125 lb 6.4 oz (56.9 kg)   LMP 07/31/2020   SpO2 98%   BMI 22.21 kg/m   General: NAD, well appearing Neuro: A&O Respiratory: normal WOB on RA Extremities: Moving all 4 extremities equally   ASSESSMENT/PLAN:   Postmenopausal bleeding    PROCEDURE:  A timeout protocol was performed prior to initiating the procedure Bimanual examination was done to determine the position of the uterus.  Vaginal speculum was inserted, and the cervix was visualized.  The cervix was cleansed with antiseptic solution.  The device was inserted through the cervical canal, into the uterine cavity, and up to the fundus. A tenaculum may be placed on the cervix, but usually this is not necessary  The depth of the uterus was determined with the instrument The instrument was withdrawn as it was rotated 3-4 times to obtain the sample which was sent in formalin to pathology    Followup: The patient tolerated the procedure well without complications.  Standard post-procedure care is explained and return precautions are given.   No follow-ups on file.  Celine Mans, MD [facility]

## 2023-02-04 ENCOUNTER — Telehealth: Payer: Self-pay

## 2023-02-04 LAB — FOLLICLE STIMULATING HORMONE: FSH: 73.9 m[IU]/mL

## 2023-02-04 NOTE — Telephone Encounter (Signed)
Patient calls nurse line requesting to speak with provider in regards to lab results.   She reports she viewed these on mychart.   Will forward to ordering provider.

## 2023-02-04 NOTE — Progress Notes (Signed)
I am not sure that the bleeding is truly postmenopausal.  She has not had a period for several years but she is also on several medications that could mask that.  For that reason we will check FSH.  Today we did endometrial biopsy as well.  I think the sample was adequate for evaluation although somewhat scant.  I will call her next week with the results of the pathology.  Her pelvic ultrasound has been done but not read.  I will also update her on that when I see her.

## 2023-02-04 NOTE — Telephone Encounter (Signed)
Spoke with patient about her results.  FSH level documents postmenopausal state.  Her other test results are not back.  I reminded her that we will be closed Monday for Labor Day and I will probably be talking with her either Tuesday or Wednesday when the rest of the results come back.

## 2023-02-08 ENCOUNTER — Encounter: Payer: Self-pay | Admitting: Family Medicine

## 2023-02-10 LAB — SURGICAL PATHOLOGY

## 2023-02-11 NOTE — Telephone Encounter (Signed)
Tried to call patient and got her voicemail.  She had previously told me it would be okay to leave message so I left the message that her results were normal.  I told her I would try to call her back on Monday to see if she had questions.  I had tried to get some additional testing done on that sample to subtype HPV but for what ever reason they were unable to do that.  Unfortunately that caused a delay in me getting back to her and I apologize for that.

## 2023-02-11 NOTE — Telephone Encounter (Signed)
Patient calls nurse line multiple times requesting pathology results.   Will forward to [place].

## 2023-02-14 ENCOUNTER — Ambulatory Visit: Payer: Medicare Other

## 2023-02-15 ENCOUNTER — Telehealth: Payer: Self-pay | Admitting: Family Medicine

## 2023-02-15 DIAGNOSIS — N950 Postmenopausal bleeding: Secondary | ICD-10-CM

## 2023-02-15 NOTE — Telephone Encounter (Signed)
Left voice message that I was calling about her test results and if she would call back and leave me sometimes, I would call her.  Otherwise I will send her note in the mail.  Specifically her endometrial biopsy was negative.  The pelvic ultrasound showed a 6.3 mm stripe with a very small cyst.  This could be causing some bleeding.  The options would be to follow her for continued bleeding and if she had that, send her to OB/GYN for consideration of histo sonogram.  If she did not have any more bleeding, could repeat pelvic ultrasound in 6 months.  I feel fairly certain that this is totally benign but would like to get follow-up of some sort.

## 2023-02-18 ENCOUNTER — Encounter: Payer: Self-pay | Admitting: Family Medicine

## 2023-02-18 NOTE — Telephone Encounter (Signed)
Spoke to her via phone Discussed follow up options which were essentially three: Repeat PUS +/- endometrial; biopsy (based on PUS results and whether or not she has further bleeding) Planned repeat both PUS and endometrial biopsy regardless in 3 months Evaluation by GYN in 2-3 months  - After discussion, she agreed to getting an eval by GYN. She is aware they may want to repeat tests, likely no sooner than 2-3 months. They may also want to do additional or different testing (sonohysterography) - I have placed GYN referral, and she will let me know if she does not hear from their office in 10-14 days.  - I reassured her that repeat testing would not be useful if immediately performed so I am not concerned if we cannot get her into their clinic for 2-3 months (but she should hear from their office in 10-14 days for scheduling appt)  She appreciated call.  She can call me back if questions or issues in interim.

## 2023-02-24 ENCOUNTER — Ambulatory Visit: Payer: Medicare Other | Admitting: Family Medicine

## 2023-03-22 ENCOUNTER — Encounter: Payer: Self-pay | Admitting: Student

## 2023-03-22 ENCOUNTER — Ambulatory Visit (INDEPENDENT_AMBULATORY_CARE_PROVIDER_SITE_OTHER): Payer: Medicare Other | Admitting: Student

## 2023-03-22 VITALS — BP 109/86 | HR 63 | Ht 63.0 in | Wt 123.4 lb

## 2023-03-22 DIAGNOSIS — G44209 Tension-type headache, unspecified, not intractable: Secondary | ICD-10-CM

## 2023-03-22 MED ORDER — ONDANSETRON HCL 4 MG PO TABS: 4.0000 mg | ORAL_TABLET | Freq: Three times a day (TID) | ORAL | 0 refills | Status: DC | PRN

## 2023-03-22 MED ORDER — IBUPROFEN 600 MG PO TABS
600.0000 mg | ORAL_TABLET | Freq: Three times a day (TID) | ORAL | 0 refills | Status: DC | PRN
Start: 2023-03-22 — End: 2023-08-04

## 2023-03-22 NOTE — Patient Instructions (Addendum)
It was wonderful to see you today. Thank you for allowing me to be a part of your care. Below is a short summary of what we discussed at your visit today:  Your headache as described today is most likely tension type headache.  And this could be attributed to your ongoing menopause.  I would recommend taking ibuprofen for the headache.  You can interchange it with over-the-counter Tylenol.   If you have any questions or concerns, please do not hesitate to contact us via phone or MyChart message.   Jerre Simon, MD [facility]

## 2023-03-22 NOTE — Progress Notes (Signed)
    SUBJECTIVE:   CHIEF COMPLAINT / HPI:   Patient is a 47 year old female presenting today for concerns of migraine.   Describes her headache as daily band like distribution with photophobia.  Headache has been on for the past few months With associated nausea but no emesis  Takes as needed Aleve No extremity weakness, no vision changes, tearing or loss of conscious. No recent head trauma. Has had history of headache for years  Tried  Imitrex and propranolol in the past per chart review Patient doesn't remember trying these meds     PERTINENT  PMH / PSH: Reviewed   OBJECTIVE:   BP 109/86   Pulse 63   Ht 5\' 3"  (1.6 m)   Wt 123 lb 6.4 oz (56 kg)   LMP 07/31/2020   SpO2 (!) 88%   BMI 21.86 kg/m    Physical Exam General: Alert, well appearing, NAD Cardiovascular: RRR, No Murmurs, Normal S2/S2 Respiratory: CTAB, No wheezing or Rales Abdomen: No distension or tenderness Extremities: No edema on extremities   Neuro: CN 2-12 intact, no focal neurologic deficit   ASSESSMENT/PLAN:   Tension headache Patient headache could be tension vs migraine headache. Given distribution and presentation suspect headache to be more son tension headache likely attributed to her menopause. No red flag signs and neurologic exam was unremarkable. -Rx PRN Ibuprofen and Zofran -Recommend adequate Hydration -ED precaution discussed -Will consider referral to headache clinic if symptoms continues or worse     Jerre Simon, MD [facility]

## 2023-03-22 NOTE — Assessment & Plan Note (Addendum)
Patient headache could be tension vs migraine headache. Given distribution and presentation suspect headache to be more son tension headache likely attributed to her menopause. No red flag signs and neurologic exam was unremarkable. -Rx PRN Ibuprofen and Zofran -Recommend adequate Hydration -ED precaution discussed -Will consider referral to headache clinic if symptoms continues or worse

## 2023-04-04 NOTE — Progress Notes (Addendum)
    SUBJECTIVE:   CHIEF COMPLAINT / HPI: nausea  Nausea for a couple weeks and sometimes vomits as well. She can't associate any specific foods that make it worse and  nothing seems to makes it better. Has tried Ibuprofen and Zofran prescribed at he last visit, which didn't help. Answered "I don't know" to what makes it worse, better, or any associates symptoms.  Patient's LMP was about 3 years ago. Previously has reported occasional migraines with associated nausea which she has tried Aleve for. Has previously tried Imitrex and Propranolol per chart review, but doesn't remember taking these meds.   Her weight has remained consistent with in the past couple visit between 124-126 lbs. Patient denies abdominal pain and headaches today. She reports that she can still eat and drink in moderation, just feels nauseous after.  PERTINENT  PMH / PSH:  GERD, bipolar disorder, contact dermatitis  OBJECTIVE:   BP 118/76   Pulse 73   Wt 126 lb (57.2 kg)   LMP 07/31/2020   SpO2 98%   BMI 22.32 kg/m   General: Awake and Alert in NAD HEENT: Normocephalic, atraumatic. Conjunctiva normal. No nasal discharge Cardiovascular: RRR. No M/R/G Respiratory: CTAB, normal WOB on RA. No wheezing, crackles, rhonchi, or diminished breath sounds. Abdomen: Soft, non-tender, non-distended. Bowel sounds normoactive Extremities: No BLE edema, no deformities or significant joint findings. Skin: Warm and dry. Neuro: A&Ox3. No focal neurological deficits.  ASSESSMENT/PLAN:   Assessment & Plan Nausea Unclear cause of nausea and vomiting per patient history. Has tried Zofran with no relief. Differential for nausea include: GERD (could be likely due to discomfort after eating), Nausea associated with migraines (possible, but she doesn't report headaches as consistently as her nausea and affiliates them with eating), Anxiety (could be playing a role d/t her hx of anxiety and recent medical workup), Illness anxiety disorder  (possibly due to hyperfixation on her symptoms without clear/known details). - Trial Reglan 10 mg q8h PRN - Trial Pantoprazole 20 mg daily to see if GERD is playing a role in her nausea - Follow up in 4 weeks to further investigate  [name], DO [facility]

## 2023-04-05 ENCOUNTER — Ambulatory Visit: Payer: Medicare Other | Admitting: Family Medicine

## 2023-04-05 ENCOUNTER — Encounter: Payer: Self-pay | Admitting: Family Medicine

## 2023-04-05 VITALS — BP 118/76 | HR 73 | Wt 126.0 lb

## 2023-04-05 DIAGNOSIS — K219 Gastro-esophageal reflux disease without esophagitis: Secondary | ICD-10-CM | POA: Diagnosis not present

## 2023-04-05 DIAGNOSIS — R110 Nausea: Secondary | ICD-10-CM | POA: Diagnosis present

## 2023-04-05 MED ORDER — PANTOPRAZOLE SODIUM 20 MG PO TBEC
20.0000 mg | DELAYED_RELEASE_TABLET | Freq: Every day | ORAL | 0 refills | Status: DC
Start: 2023-04-05 — End: 2023-08-04

## 2023-04-05 MED ORDER — METOCLOPRAMIDE HCL 5 MG PO TABS
5.0000 mg | ORAL_TABLET | Freq: Three times a day (TID) | ORAL | 0 refills | Status: DC | PRN
Start: 2023-04-05 — End: 2023-08-04

## 2023-04-05 NOTE — Patient Instructions (Addendum)
It was great to see you today! Thank you for choosing [facility] for your primary care. Kimberly Browning was seen for nausea.  Today we addressed: Nausea - take Reglan as needed for nausea along with pantoprazole to see if this resolves your symptoms.  If you haven't already, sign up for My Chart to have easy access to your labs results, and communication with your primary care physician.   You should return to our clinic Return in about 4 weeks (around 05/03/2023). Please arrive 15 minutes before your appointment to ensure smooth check in process.  We appreciate your efforts in making this happen.  Thank you for allowing me to participate in your care, [name], DO 04/05/2023, 2:19 PM PGY-1, [facility]

## 2023-04-08 ENCOUNTER — Other Ambulatory Visit: Payer: Self-pay

## 2023-04-08 ENCOUNTER — Encounter: Payer: Self-pay | Admitting: Family Medicine

## 2023-04-08 ENCOUNTER — Ambulatory Visit (INDEPENDENT_AMBULATORY_CARE_PROVIDER_SITE_OTHER): Payer: Medicare Other | Admitting: Family Medicine

## 2023-04-08 VITALS — BP 112/66 | HR 64 | Wt 126.1 lb

## 2023-04-08 DIAGNOSIS — Z1330 Encounter for screening examination for mental health and behavioral disorders, unspecified: Secondary | ICD-10-CM | POA: Diagnosis not present

## 2023-04-08 DIAGNOSIS — N950 Postmenopausal bleeding: Secondary | ICD-10-CM | POA: Diagnosis not present

## 2023-04-08 NOTE — Progress Notes (Signed)
GYNECOLOGY OFFICE VISIT NOTE  History:   Kimberly Browning is a 47 y.o. No obstetric history on file. here today for second opinion.  Patient had post menopausal bleeding in August (three months ago) Had TVUS that showed endometrial thickness of 6 mm Then had EMB with benign pathology Told by PCP to come for second opinion No bleeding since then  Health Maintenance Due  Topic Date Due   DTaP/Tdap/Td (3 - Td or Tdap) 04/01/2021   COVID-19 Vaccine (4 - 2023-24 season) 02/06/2023    Past Medical History:  Diagnosis Date   Anxiety    Bipolar disorder (HCC)    Depression    Dry skin    hands   Feeling of incomplete bladder emptying    GERD (gastroesophageal reflux disease)    Hypercholesteremia    Nocturia    Sleep apnea    Urge urinary incontinence    Vitamin D deficiency    Wears contact lenses     Past Surgical History:  Procedure Laterality Date   BREAST REDUCTION SURGERY  05/10/2011   Procedure: MAMMARY REDUCTION BILATERAL (BREAST);  Surgeon: Rosalio Macadamia;  Location: [facility];  Service: Plastics;  Laterality: Bilateral;  bilateral breast reduction   DILATION AND EVACUATION  12/26/2008   x 2   INTERSTIM IMPLANT PLACEMENT N/A 07/09/2014   Procedure: Leane Platt IMPLANT FIRST STAGE;  Surgeon: Martina Sinner, MD;  Location: [facility];  Service: Urology;  Laterality: N/A;   INTERSTIM IMPLANT PLACEMENT N/A 07/09/2014   Procedure: Leane Platt IMPLANT SECOND STAGE;  Surgeon: Martina Sinner, MD;  Location: [facility];  Service: Urology;  Laterality: N/A;   INTRAUTERINE DEVICE (IUD) INSERTION  sept 2011   mirena   WISDOM TOOTH EXTRACTION  as a teenager    The following portions of the patient's history were reviewed and updated as appropriate: allergies, current medications, past family history, past medical history, past social history, past surgical history and problem list.   Health Maintenance:   Last pap: Lab  Results  Component Value Date   DIAGPAP  10/08/2021    - Negative for Intraepithelial Lesions or Malignancy (NILM)   DIAGPAP - Benign reactive/reparative changes 10/08/2021   HPVHIGH Negative 10/08/2021    Last mammogram:  08/22/20 - BIRADS 1   Review of Systems:  Pertinent items noted in HPI and remainder of comprehensive ROS otherwise negative.  Physical Exam:  BP 112/66   Pulse 64   Wt 126 lb 1.6 oz (57.2 kg)   LMP 07/31/2020   BMI 22.34 kg/m  CONSTITUTIONAL: Well-developed, well-nourished female in no acute distress.  HEENT:  Normocephalic, atraumatic. External right and left ear normal. No scleral icterus.  NECK: Normal range of motion, supple, no masses noted on observation SKIN: No rash noted. Not diaphoretic. No erythema. No pallor. MUSCULOSKELETAL: Normal range of motion. No edema noted. NEUROLOGIC: Alert and oriented to person, place, and time. Normal muscle tone coordination.  PSYCHIATRIC: Normal mood and affect. Normal behavior. Normal judgment and thought content. RESPIRATORY: Effort normal, no problems with respiration noted  Labs and Imaging No results found for this or any previous visit (from the past 168 hour(s)). No results found.    Assessment and Plan:   Problem List Items Addressed This Visit       Other   Postmenopausal bleeding - Primary    No recurrence. Imaging and labs reviewed in detail. Discussed that the workup that has been done is exactly what I  would have done and is very reassuring. I recommended against pursuing additional workup such as hysteroscopy unless she has recurrence of bleeding. All questions answered.        Routine preventative health maintenance measures emphasized. Please refer to After Visit Summary for other counseling recommendations.   Return for as needed.    Total face-to-face time with patient: 15 minutes.  Over 50% of encounter was spent on counseling and coordination of care.   Venora Maples,  MD/MPH Attending Family Medicine Physician, [facility], [facility]

## 2023-04-08 NOTE — Assessment & Plan Note (Signed)
No recurrence. Imaging and labs reviewed in detail. Discussed that the workup that has been done is exactly what I would have done and is very reassuring. I recommended against pursuing additional workup such as hysteroscopy unless she has recurrence of bleeding. All questions answered.

## 2023-05-12 ENCOUNTER — Ambulatory Visit: Payer: Medicare Other | Admitting: Family Medicine

## 2023-05-24 NOTE — Progress Notes (Deleted)
    SUBJECTIVE:   CHIEF COMPLAINT / HPI: cyst on hand  ***  PERTINENT  PMH / PSH: GERD, bipolar disorder, contact dermatitis  OBJECTIVE:   LMP 07/31/2020   General: Awake and Alert in NAD HEENT: NCAT. Sclera anicteric. No rhinorrhea. No oropharyngeal erythema. Cardiovascular: RRR. No M/R/G Respiratory: CTAB, normal WOB on RA. No wheezing, crackles, rhonchi, or diminished breath sounds. Abdomen: Soft, non-tender, non-distended. Bowel sounds normoactive/hypoactive/hyperactive. *** Extremities: Able to move all extremities equally. No BLE edema, no deformities or significant joint findings. Skin: Warm and dry. No abrasions or rashes noted. Neuro: A&Ox***. No focal neurological deficits.  ASSESSMENT/PLAN:   No problem-specific Assessment & Plan notes found for this encounter.     [name], DO [facility]

## 2023-05-26 ENCOUNTER — Ambulatory Visit: Payer: Medicare Other | Admitting: Family Medicine

## 2023-06-17 ENCOUNTER — Telehealth (INDEPENDENT_AMBULATORY_CARE_PROVIDER_SITE_OTHER): Payer: Medicare Other | Admitting: Student

## 2023-06-17 ENCOUNTER — Telehealth: Payer: Self-pay

## 2023-06-17 DIAGNOSIS — F4321 Adjustment disorder with depressed mood: Secondary | ICD-10-CM

## 2023-06-17 DIAGNOSIS — F4320 Adjustment disorder, unspecified: Secondary | ICD-10-CM | POA: Insufficient documentation

## 2023-06-17 NOTE — Assessment & Plan Note (Addendum)
 Symptoms sound most fitting with grief reaction after loss of her pet. I am not concerned about any organic cause of her symptoms at this time including gastroparesis, malignancy, pancreatitis, increased intracranial pressure. She says that she is feeling very guilty about having to euthanize her cat and has really affected her appetite. I encouraged her to try to eat small snacks throughout the day with protein, carbs and fat to help stabilize blood sugar. Discussed the mind-body connection. Provided with list of therapists for CBT that accept her insurance.  We discussed the importance of therapy and helping with grief. I discussed return precautions should she develop worst headache of her life, intractable nausea or vomiting, suicidal ideation, weakness in her arms or legs, syncopal episodes.

## 2023-06-17 NOTE — Telephone Encounter (Signed)
 Patient calls nurse line requesting increased dosage on metoclopramide  or alternative medication to help with her nausea.   She feels like the current dosage of 5 mg is not helping as much as it was.   Scheduled virtual visit this afternoon with Dr. Dameron to further discuss options for nausea.   Chiquita JAYSON English, RN

## 2023-06-17 NOTE — Progress Notes (Addendum)
 [facility] Telemedicine Visit  Patient consented to have virtual visit and was identified by name and date of birth. Method of visit: Telephone  Encounter participants: Patient: Kimberly Browning - located at [address] [place] Provider: Barabara Dama - located at home  Chief Complaint: Nausea  HPI:  Kimberly Browning is a 48 year-old female who would like to discuss nausea. She says she has not been feeling well since December 3rd because she had to put her have her cat euthanized.  She has been feeling a lot of guilt since then.  She had to do this because she could not afford the cost of the Food.  She has not been able to eat well since this happened. Having almost complete loss of appetite.  She takes hydroxyzine at night which helps her sleep.  No head trauma. No vision changes, loss of consciousness.  She says that she is taking Ibuprofen  600 mg for headaches, which she gets weekly.  Not currently following with a therapist or psychiatrist.  ROS: per HPI  Pertinent PMHx:   Exam:  LMP 07/31/2020   Respiratory: Speaks in full sentences without difficulty  Assessment/Plan:  Grief reaction Symptoms sound most fitting with grief reaction after loss of her pet. I am not concerned about any organic cause of her symptoms at this time including gastroparesis, malignancy, pancreatitis, increased intracranial pressure. She says that she is feeling very guilty about having to euthanize her cat and has really affected her appetite. I encouraged her to try to eat small snacks throughout the day with protein, carbs and fat to help stabilize blood sugar. Discussed the mind-body connection. Provided with list of therapists for CBT that accept her insurance.  We discussed the importance of therapy and helping with grief. I discussed return precautions should she develop worst headache of her life, intractable nausea or vomiting, suicidal  ideation, weakness in her arms or legs, syncopal episodes.    Time spent during visit with patient: 20 minutes

## 2023-06-17 NOTE — Patient Instructions (Addendum)
Therapy and Counseling Resources Most providers on this list will take Medicaid. Patients with commercial insurance or Medicare should contact their insurance company to get a list of in network providers.  [facility] (takes children) Location 1: [address] [place], [place] [address] Location 2: [address] [place], [place] [address] [phone]   [facility] (spanish speaking therapist available)(habla espanol)(take medicare and medicaid)  [address], [place], [place] [address], [place] [email or web address] [phone]  [facility] [address] [place], [place] [address] [phone]  [facility]   [address]   [place], [place] [address]      [phone]  [facility] (spanish available) [address]  [place], [place] [address] [phone]  [facility] (take medicaid and medicare) [address]  [place], [place] [address]       [phone]     [facility] (virtual only) [phone]  [facility] [address], [place], [place] [phone]  [facility]:  [address] [place] [phone]  [facility]:  [address], [place] [phone]  [facility] (under & uninsured) [address]   [place] [phone]    [email or web address]    [facility] [address]  [place]    [phone]  [facility] -[address]     Phone:  [phone]     [place]-  [address]  [phone]   [facility] [address]      [place], [place] [phone]  [facility]: [address], [place], [place]  [phone]   [facility] (Spanish therapist) [email or web address]  [address]    [place] [address]    [phone]    [facility]   [address],  [place], [place]  [phone]   [facility]  [address] [place]  [phone]  [facility]  [address] [place], [place]        [phone]  Open Access/Walk In Clinic under & uninsured  [facility]  [address] [place], [place] [phone] Crisis [phone]  [facility] [place],  (Spanish)   [address], [place]: ([phone]) 8:30 - 12; 1 - 2:30  [facility],  [address], [place]    ([phone]):8:30 - 12; 2 - 3PM  [facility] [place],  [address],  [place]; ([phone]):   Mon - Fri 8 AM - 5 PM  [facility] [address] [place]  MWF 12:30 to 3:00 or call to schedule an appointment  [phone]  Specific Provider options Psychology Today  [email or web address] click on find a therapist  enter your zip code left side and select or tailor a therapist for your specific need.   [facility] Provider Directory [email or web address]  (Medicaid)   Follow all drop down to find a provider  Social Support program [facility] [place] [phone] or [email or web address] [address], [place], [place] Recovery support and educational   24- Hour Availability:   [facility]  [address] [place], [place] [phone] Crisis [phone]  [facility]  Crisis Line [phone]  [facility]   Crisis Service  [phone]   [facility] [place] [facility]  [phone] (after hours)  [facility]/Mobile Crisis   1-877-626-1772  [place] National Suicide Hotline  1-800-273-8255 (TALK)  Call 911 or go to emergency room  [facility]  (800-256-2452);  [place]   [facility]   ([phone]); [place], [place], [place], [place], [place], [place], [place]  

## 2023-06-23 ENCOUNTER — Ambulatory Visit: Payer: Medicare Other | Admitting: Family Medicine

## 2023-06-25 DIAGNOSIS — M25521 Pain in right elbow: Secondary | ICD-10-CM | POA: Insufficient documentation

## 2023-06-29 DIAGNOSIS — G5621 Lesion of ulnar nerve, right upper limb: Secondary | ICD-10-CM | POA: Insufficient documentation

## 2023-07-09 NOTE — Progress Notes (Deleted)
    SUBJECTIVE:   CHIEF COMPLAINT / HPI: Cyst  ***  PERTINENT  PMH / PSH: GERD, bipolar disorder, contact dermatitis   OBJECTIVE:   LMP 07/31/2020   General: Awake and Alert in NAD HEENT: NCAT. Sclera anicteric. No rhinorrhea. No oropharyngeal erythema. Cardiovascular: RRR. No M/R/G Respiratory: CTAB, normal WOB on RA. No wheezing, crackles, rhonchi, or diminished breath sounds. Abdomen: Soft, non-tender, non-distended. Bowel sounds normoactive/hypoactive/hyperactive. *** Extremities: Able to move all extremities equally. No BLE edema, no deformities or significant joint findings. Skin: Warm and dry. No abrasions or rashes noted. Neuro: A&Ox***. No focal neurological deficits.  ASSESSMENT/PLAN:   No problem-specific Assessment & Plan notes found for this encounter.     Kathrine Melena, DO  [facility]

## 2023-07-15 ENCOUNTER — Ambulatory Visit: Payer: Medicare Other | Admitting: Family Medicine

## 2023-07-19 NOTE — Progress Notes (Signed)
SUBJECTIVE:   CHIEF COMPLAINT / HPI: nausea and headaches  Previously presented with similar symptoms however could not give adequate description of what is her symptoms are associated with.  At her last visit she was prescribed Reglan 10 mg to take as needed and pantoprazole was trialed to see if GERD could be contributing to her nausea.  She has not followed up for this since October 2024 until now.  She feels like her headaches, depression, and anxiety are related to her cat Garfield's death.  She has not tried therapy which Dr. Melissa Noon recommended at her last visit.  She shares that she talks to her daughter about it instead.  She reports having a headache every morning while walking up. Persistent over the last week.  Difficult to shake her head d/t pain.  Describes the pain as pounding, wrapping around her head, however light does not seem to bother her nor does looking at screens.  No known history of migraines.  Has tried Ibuprofen 600 mg and that resolves it temporarily, but then her symptoms return the next morning.  Denies any blurry vision or spots in her vision.  Denies this being the worst headache of her life and she is still able to do her daily activities.  She is supposed to follow up with her eye doctor soon it has been delayed due to insurance issues.  Headaches make her nausea worse, but this is been a concern for her since October.  She reports that the nausea has gotten somewhat better, but has worsened this past week with the headaches.  No episodes of vomiting.  She reports not taking the Protonix or Reglan.    She has been able to eat seaweed, soft pretzels, crackers with dip/cream, rice krispies with syrup and whip cream.  She normally snacks instead of eating a whole meal, since she can't cook.    PERTINENT  PMH / PSH: GERD, bipolar disorder, contact dermatitis   OBJECTIVE:   BP 110/60   Pulse 65   Wt 129 lb 12.8 oz (58.9 kg)   LMP 07/31/2020   SpO2 97%   BMI  22.99 kg/m   General: Awake and Alert in NAD HEENT: NCAT. Sclera anicteric.. TTP over temples and occipital region. Cardiovascular: RRR. No M/R/G Respiratory: CTAB, normal WOB on RA. No wheezing, crackles, rhonchi, or diminished breath sounds. Abdomen: Soft, non-tender, non-distended. Bowel sounds normoactive Extremities: Able to move all extremities equally. No BLE edema, no deformities or significant joint findings. Skin: Warm and dry. No abrasions or rashes noted. Neuro:  CN II: PERRL CN III, IV,VI: EOMI CV V: Normal sensation in V1, V2, V3 CVII: Symmetric smile and brow raise CN VIII: Normal hearing CN IX,X: Symmetric palate raise  CN XI: 5/5 shoulder shrug CN XII: Symmetric tongue protrusion  UE and LE strength 5/5 Normal sensation in UE and LE bilaterally   ASSESSMENT/PLAN:   Assessment & Plan Chronic tension-type headache, intractable Unclear cause of nausea and vomiting.  Has been recurrent and persistent for over a year.  She has followed up with neurology in the past and gotten MRI of the brain w/o contrast which was negative.  Previously has trialed abortive therapy with no improvement.  Differential includes: Tension HA - Most likely secondary to the nature and description of her headaches, chronicity, poor appetite/diet and hydration.  This could also positional due to her way of sleeping or the pillow she uses since these headaches primarily occur in the AM. Migraines -  Considered due to having persistent headaches that occur repeatedly that are pounding and present with nausea, at times. However, her headaches aren't unilateral, have been recurrent, and last for several days without benefit from abortive therapy in the past. She also denies any photophobia or phonophobia.  IIH - Considered however, patient has no vision changes and she has previously had an MRI of her brain w/o contrast w/ no significant findings.  She is also has gotten yearly eye exams, and is due for one  soon.  No neurological deficits noted on exam. Anxiety/depression/mood disorder - Could evidently be playing a role as well with the loss of her cat exacerbating her depression/anxiety.  Reports that recently her Atarax was increased to 75 mg daily. OSA - Unlikely d/t previous sleep study on 03/23/2022 which did not show any significant obstructive or central sleep apnea with an AHI of 0.3/h, O2 nadir 91%, no significant snoring.  Medication Overuse Headache - Seems like she uses medications intermittently to treat her headaches and they have been exacerbated mainly over this past week.  - Toradol 30 mg/ml once in clinic - Discussed scheduling an appointment with the neurology group she had previously seen for further workup (information was provided on AVS) or other treatment options. Advised her to reach out if they require a new referral.   [name], DO [facility]

## 2023-07-21 ENCOUNTER — Encounter: Payer: Self-pay | Admitting: Family Medicine

## 2023-07-21 ENCOUNTER — Ambulatory Visit: Payer: Medicare Other | Admitting: Family Medicine

## 2023-07-21 VITALS — BP 110/60 | HR 65 | Wt 129.8 lb

## 2023-07-21 DIAGNOSIS — G44221 Chronic tension-type headache, intractable: Secondary | ICD-10-CM | POA: Diagnosis present

## 2023-07-21 MED ORDER — KETOROLAC TROMETHAMINE 30 MG/ML IJ SOLN
30.0000 mg | Freq: Once | INTRAMUSCULAR | Status: AC
Start: 2023-07-21 — End: 2023-07-21
  Administered 2023-07-21: 30 mg via INTRAVENOUS

## 2023-07-21 NOTE — Patient Instructions (Signed)
It was great to see you today! Thank you for choosing [facility] for your primary care. Kimberly Browning was seen for headache and nausea.  Today we addressed: There is no clear cause of your symptoms at this time. This could be due to migraines vs poor nutrition/hydration vs medication side effects from your psychiatric medications. We will treat your symptoms today with Toradol to help improve your symptoms.  We recommend that you follow-up with neurology like you have seen in the past. If your symptoms get worse and this feels like the worst headache of your life, you are having vision changes or pressure in your head/eyes please don't hesitate to go to the ED to be assessed further so no concerning findings are missed.  Previously you saw Dr. Frances Furbish in November 2023. Please call them and see if you could get another appointment, otherwise we can send another referral if needed. The address and phone number is listed below.  [facility] [address] [place],  [place]  [address] Main: [phone]  You should return to our clinic No follow-ups on file. Please arrive 15 minutes before your appointment to ensure smooth check in process.  We appreciate your efforts in making this happen.  Thank you for allowing me to participate in your care, [name], DO 07/21/2023, 3:54 PM PGY-1, [facility]

## 2023-07-29 ENCOUNTER — Telehealth: Payer: Self-pay

## 2023-07-29 ENCOUNTER — Ambulatory Visit: Payer: Medicare Other | Admitting: Student

## 2023-07-29 VITALS — BP 108/62 | HR 88 | Temp 98.3°F | Wt 126.4 lb

## 2023-07-29 DIAGNOSIS — R110 Nausea: Secondary | ICD-10-CM

## 2023-07-29 DIAGNOSIS — G4489 Other headache syndrome: Secondary | ICD-10-CM

## 2023-07-29 MED ORDER — KETOROLAC TROMETHAMINE 30 MG/ML IJ SOLN
30.0000 mg | Freq: Once | INTRAMUSCULAR | Status: AC
Start: 2023-07-29 — End: 2023-07-29
  Administered 2023-07-29: 30 mg via INTRAMUSCULAR

## 2023-07-29 MED ORDER — MAGNESIUM OXIDE (ELEMENTAL) 400 MG PO TABS: 1.0000 | ORAL_TABLET | Freq: Every day | ORAL | 2 refills | Status: DC | PRN

## 2023-07-29 MED ORDER — ONDANSETRON 4 MG PO TBDP
4.0000 mg | ORAL_TABLET | Freq: Once | ORAL | Status: AC
Start: 2023-07-29 — End: 2023-07-29
  Administered 2023-07-29: 4 mg via ORAL

## 2023-07-29 NOTE — Patient Instructions (Addendum)
Kimberly Browning,  We are going to treat your headache today with some Toradol and Zofran for the nausea.  I am sending in some magnesium oxide which she can try to use to stop future headaches.  The most important thing for you to do for now is to call your neurologist at [phone] and ask for an appointment as soon as possible.  You may need a daily controller medication but given that you are already on some centrally acting medicines like your Lamictal and amantadine, I do not want to cause inadvertent drug interactions and defer to your neurologist on this issue.  Eliezer Mccoy, MD

## 2023-07-29 NOTE — Telephone Encounter (Signed)
Patient calls nurse line regarding today's visit.   She is asking why provider prescribed her magnesium. Advised of the following message.  Ms. Quinones,   We are going to treat your headache today with some Toradol and Zofran for the nausea.  I am sending in some magnesium oxide which she can try to use to stop future headaches.  The most important thing for you to do for now is to call your neurologist at [phone] and ask for an appointment as soon as possible.  You may need a daily controller medication but given that you are already on some centrally acting medicines like your Lamictal and amantadine, I do not want to cause inadvertent drug interactions and defer to your neurologist on this issue.   Eliezer Mccoy, MD       Patient is asking if Dr. Marisue Humble is going to send in nausea medication.   Advised that I would send message to provider and call her with update.   Veronda Prude, RN

## 2023-07-29 NOTE — Progress Notes (Signed)
    SUBJECTIVE:   CHIEF COMPLAINT / HPI:   Headache  Nausea Has a longstanding history of chronic, near daily headaches that often present with nausea.  Previously followed by [facility] neuro though it appears she has not seen them since 04/2022.  She had a normal MRI of the brain at that time.  She has tried preventive treatment with propranolol and abortive treatment with Imitrex neither of which were helpful.  She has also tried preventive therapy with amitriptyline that was likewise ineffective. Of note, she self identifies as being "addicted" to [facility] and tells me that she watched TikTok videos on her phone for 14 hours yesterday. Her present headache has been present for the past 3 days and is accompanied by nausea.  No photophobia/phonophobia.  Her headache is described as "wraparound" and has not really localized anywhere in her head.  She denies any changes in her vision. She has been trying to treat her headaches with ibuprofen 600 with minimal to no effect.  She denies taking this particularly often and tells me that her last dose was about 2 days ago.  PERTINENT  PMH / PSH: Bipolar disorder, GERD, chronic headaches  OBJECTIVE:   BP 108/62   Pulse 88   Wt 126 lb 6.4 oz (57.3 kg)   LMP 07/31/2020   SpO2 97%   BMI 22.39 kg/m   Gen: Well-appearing and NAD HENT: Atraumatic Pulm; Normal WOB on RA Neuro: EOMs intact. CN II-XII intact; strength and sensation intact throughout. Gait is normal and speech is fluent.   ASSESSMENT/PLAN:   Assessment & Plan Other headache syndrome Chronic headaches.  The history is not obviously suggestive of migraine, though she does have pounding headaches that present with nausea.  Has tried and failed a number of both control and abortive agents in the past.  Choosing a next best option for her is a bit challenging given that she is already on a number of centrally acting medications for her bipolar disorder and is at high risk for drug-drug  interactions.  I am reassured by her normal MR within the past 2 years.  Though should she start to develop new or changing symptoms, certainly would consider repeating her neuroimaging.  Of note, she did have a normal sleep study in October 2023. -Migraine versus tension headache possibly related to her excessive screen time and phone use.  Advised reduction of screen time. -Will treat today's acute headache with Toradol and Zofran -Defer trial of any new controller medication to neurology.  She may be a good candidate for Nurtec or possibly erenumab -Magnesium oxide offered as a trial for abortive therapy that is unlikely to Aromasin to any potential drug interactions.  Has not responded well to sumatriptan or NSAIDs in the past. -Advised her to give her neurologists a call.  Will need their expertise in managing this complex patient.     Eliezer Mccoy, MD [facility]

## 2023-08-01 ENCOUNTER — Telehealth: Payer: Self-pay | Admitting: Neurology

## 2023-08-01 NOTE — Telephone Encounter (Signed)
 This pt need OV next available and then waitlist,  can see NP

## 2023-08-01 NOTE — Telephone Encounter (Signed)
 Pt said started having headaches a couple of weeks ago.Seen someone at PCP office because PCP was not there last week, they told me to take the Magoxide they prescribed and  see neurologist. When I take the medication it stops the headache, but headaches comes back. Would like a call from the nurse to discuss a work in appt.

## 2023-08-02 NOTE — Telephone Encounter (Signed)
 Scheduled appointment with Amy on 08/04/23 at 1:30 pm

## 2023-08-02 NOTE — Progress Notes (Addendum)
 Chief Complaint  Patient presents with   Follow-up    Pt in 1 alone Pt here for headaches f/u Pt states for the last 2 weeks daily headaches Pt states nausea with headaches      HISTORY OF PRESENT ILLNESS:  08/17/23 ALL:  Kimberly Browning is a 48 y.o. female here today for follow up for headaches. She was last seen by Dr Frances Furbish 04/2022. She reported daily headaches but unclear if she was taking propranolol and sumatriptan. She was advised to call back with med list and amitriptyline would be considered if she could wean mirtazapine. Advised to discuss with Kathrin Ruddy, psychiatry NP.   Since, she reports headaches have worsened. Having 12-15 headache a month. She describes the pain as a throbbing pain, unilateral but can be bilateral. She is occasionally nauseated with headache. No clear light or sound sensitivity. Worse with activity. She takes ibuprofen but does not feel it works. She has not had any significant vision changes. Last eye exam 2 years ago. She drinks about 16 ounces of water daily. She is grieving the loss of her cat of 10 years. She is sleeping well.   Meds tried previously: divalproex (rash), gabapentin, lamotrigine, propranolol contraindicated d/t low BP, topiramate contraindicated due to depression/mood disorder, amitriptyline contraindicated d/t multiple sedating meds and mood disorder, ibuprofen   HISTORY (copied from Dr Teofilo Pod previous note)  Kimberly Browning is a 48 year old left-handed woman with an underlying medical history of vitamin D deficiency, reflux disease, hyperlipidemia, depression, anxiety, sleep apnea, who presents for follow-up consultation of her recurrent headaches after interim testing.  The patient is unaccompanied today.  I saw her at the request of her primary care on 02/04/2022, at which time she reported a 65-month history of frequent headaches.  She was advised to proceed with additional testing including a brain MRI as well as a sleep study.  She had a  baseline sleep study on 03/23/2022 which did not show any significant obstructive or central sleep apnea with an AHI of 0.3/h, O2 nadir 91%, no significant snoring.  She did have decreased sleep efficiency, difficulty initiating and maintaining sleep.   She had a brain MRI w/wo contrast on 02/22/22 and I reviewed the results:    IMPRESSION:   This is a normal MRI of the brain with and without contrast   Today, 04/19/2022: She reports nearly daily headaches.  She does not currently have a headache.  She had an up-to-date eye examination this year.  She initially indicated that she does not take propranolol or sumatriptan and but then reports taking sumatriptan hand only as needed and taking propranolol at bedtime only.  She reports not taking any caffeine daily.  She takes mirtazapine at bedtime, 30 mg.  She is followed by, Ellis Savage, NP for mental health.   REVIEW OF SYSTEMS: Out of a complete 14 system review of symptoms, the patient complains only of the following symptoms, headaches, depression and all other reviewed systems are negative.   ALLERGIES: Allergies  Allergen Reactions   Geodon [Ziprasidone Hydrochloride] Shortness Of Breath and Swelling   Hibiclens [Chlorhexidine] Itching    Redness and itching after using CHG wipes   Sulfamethoxazole-Trimethoprim Other (See Comments)    hallucinations   Doxycycline Other (See Comments)    Unknown reaction   Gabapentin Other (See Comments)    Unknown reaction   Other Other (See Comments)    Bath and body works body wash and lotion caused itching, reddness and rash  Penicillins Other (See Comments)    Unknown reaction Did it involve swelling of the face/tongue/throat, SOB, or low BP? Unknown Did it involve sudden or severe rash/hives, skin peeling, or any reaction on the inside of your mouth or nose? Unknown Did you need to seek medical attention at a hospital or doctor's office? Unknown When did it last happen?  unknown     If all  above answers are "NO", may proceed with cephalosporin use.   Promethazine Other (See Comments)   Depakote [Divalproex Sodium] Rash     HOME MEDICATIONS: Outpatient Medications Prior to Visit  Medication Sig Dispense Refill   hydrOXYzine (ATARAX) 50 MG tablet 75 mg.     lamoTRIgine (LAMICTAL) 200 MG tablet Take 400 mg by mouth daily.     temazepam (RESTORIL) 15 MG capsule Take 3 capsules by mouth daily.     amantadine (SYMMETREL) 100 MG capsule Take 100 mg by mouth daily.     ibuprofen (ADVIL) 600 MG tablet Take 1 tablet (600 mg total) by mouth every 8 (eight) hours as needed. 30 tablet 0   Magnesium Oxide, Elemental, 400 MG TABS Take 1 tablet by mouth daily as needed (headache). (Patient not taking: Reported on 08/04/2023) 100 tablet 2   metoCLOPramide (REGLAN) 5 MG tablet Take 1 tablet (5 mg total) by mouth every 8 (eight) hours as needed for nausea. (Patient not taking: Reported on 08/04/2023) 20 tablet 0   pantoprazole (PROTONIX) 20 MG tablet Take 1 tablet (20 mg total) by mouth daily. (Patient not taking: Reported on 08/04/2023) 30 tablet 0   No facility-administered medications prior to visit.     PAST MEDICAL HISTORY: Past Medical History:  Diagnosis Date   Anxiety    Bipolar disorder (HCC)    Depression    Dry skin    hands   Feeling of incomplete bladder emptying    GERD (gastroesophageal reflux disease)    Herpes simplex virus (HSV) infection 10/10/2007   Qualifier: Diagnosis of   By: Karn Pickler MD, Milas Kocher SNOMED Dx Update Oct 2024     History of molar pregnancy, antepartum 12/12/2008   History of tobacco use 06/09/2009   Qualifier: Diagnosis of   By: Knox Royalty      History of smokeing 4-6 cigarettes/day since she was 48 years old.   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 Is motivated for weight loss. Not physically active.  Has trialed Metformin - d/c'd due to GI upset, Pheniramine - unable to tolerate, Saxenda 3mg  but was discontinued as it was "not appropriate" per last PCP. Has had success with Phentermine 37.5mg  but developed insomnia - opted to discontinued given unlikely long term improvement. Self discontinued Wellb   Sleep apnea    Symptomatic mammary hypertrophy 08/15/2020   Urge urinary incontinence    Vitamin D deficiency    Wears contact lenses      PAST SURGICAL HISTORY: Past Surgical History:  Procedure Laterality Date   BREAST REDUCTION SURGERY  05/10/2011   Procedure: MAMMARY REDUCTION BILATERAL (BREAST);  Surgeon: Rosalio Macadamia;  Location: [facility];  Service: Plastics;  Laterality: Bilateral;  bilateral breast reduction   DILATION AND EVACUATION  12/26/2008   x 2  INTERSTIM IMPLANT PLACEMENT N/A 07/09/2014   Procedure: Leane Platt IMPLANT FIRST STAGE;  Surgeon: Martina Sinner, MD;  Location: [facility];  Service: Urology;  Laterality: N/A;   INTERSTIM IMPLANT PLACEMENT N/A 07/09/2014   Procedure: Leane Platt IMPLANT SECOND STAGE;  Surgeon: Martina Sinner, MD;  Location: [facility];  Service: Urology;  Laterality: N/A;   INTRAUTERINE DEVICE (IUD) INSERTION  sept 2011   mirena   WISDOM TOOTH EXTRACTION  as a teenager     FAMILY HISTORY: Family History  Adopted: Yes  Problem Relation Age of Onset   Headache Neg Hx    Migraines Neg Hx      SOCIAL HISTORY: Social History   Socioeconomic History   Marital status: Divorced    Spouse name: Not on file   Number of children: 1   Years of education: Not on file   Highest education level: Not on file  Occupational History   Occupation: disablitiy  Tobacco Use   Smoking status: Former    Current packs/day: 0.00    Average packs/day: 0.3 packs/day for 19.0 years (4.8 ttl pk-yrs)    Types: Cigarettes    Start date:  09/22/2000    Quit date: 09/23/2019    Years since quitting: 3.9    Passive exposure: Past   Smokeless tobacco: Never   Tobacco comments:    Vapes  Vaping Use   Vaping status: Former   Substances: Nicotine, Flavoring  Substance and Sexual Activity   Alcohol use: Not Currently   Drug use: No   Sexual activity: Yes    Birth control/protection: None  Other Topics Concern   Not on file  Social History Narrative   Caffeine: cup coffee daily   Working: on disability    Education: 12 th grade   Live alone, one daughter.    Social Drivers of Corporate investment banker Strain: Low Risk  (11/23/2022)   Overall Financial Resource Strain (CARDIA)    Difficulty of Paying Living Expenses: Not hard at all  Food Insecurity: No Food Insecurity (11/23/2022)   Hunger Vital Sign    Worried About Running Out of Food in the Last Year: Never true    Ran Out of Food in the Last Year: Never true  Transportation Needs: No Transportation Needs (11/23/2022)   PRAPARE - Administrator, Civil Service (Medical): No    Lack of Transportation (Non-Medical): No  Physical Activity: Inactive (11/23/2022)   Exercise Vital Sign    Days of Exercise per Week: 0 days    Minutes of Exercise per Session: 0 min  Stress: Stress Concern Present (11/23/2022)   [employer] of Occupational Health - Occupational Stress Questionnaire    Feeling of Stress : To some extent  Social Connections: Socially Isolated (11/23/2022)   Social Connection and Isolation Panel [NHANES]    Frequency of Communication with Friends and Family: More than three times a week    Frequency of Social Gatherings with Friends and Family: More than three times a week    Attends Religious Services: Never    Database administrator or Organizations: No    Attends Banker Meetings: Never    Marital Status: Never married  Intimate Partner Violence: Not At Risk (11/23/2022)   Humiliation, Afraid, Rape, and Kick questionnaire     Fear of Current or Ex-Partner: No    Emotionally Abused: No    Physically Abused: No    Sexually Abused: No  PHYSICAL EXAM  Vitals:   08/04/23 1319  BP: 105/66  Pulse: 68  Weight: 131 lb (59.4 kg)  Height: 5\' 4"  (1.626 m)   Body mass index is 22.49 kg/m.  Generalized: Well developed, in no acute distress  Cardiology: normal rate and rhythm, no murmur auscultated  Respiratory: clear to auscultation bilaterally    Neurological examination  Mentation: Alert oriented to time, place, history taking. Follows all commands speech and language fluent Cranial nerve II-XII: Pupils were equal round reactive to light. Extraocular movements were full, visual field were full on confrontational test. Facial sensation and strength were normal. Uvula tongue midline. Head turning and shoulder shrug  were normal and symmetric. Motor: The motor testing reveals 5 over 5 strength of all 4 extremities. Good symmetric motor tone is noted throughout.  Gait and station: Gait is normal.    DIAGNOSTIC DATA (LABS, IMAGING, TESTING) - I reviewed patient records, labs, notes, testing and imaging myself where available.  Lab Results  Component Value Date   WBC 11.5 (H) 11/05/2022   HGB 12.4 11/05/2022   HCT 38.0 11/05/2022   MCV 84 11/05/2022   PLT 309 11/05/2022      Component Value Date/Time   NA 141 11/05/2022 1641   K 4.1 11/05/2022 1641   CL 101 11/05/2022 1641   CO2 24 11/05/2022 1641   GLUCOSE 90 11/05/2022 1641   GLUCOSE 88 08/05/2020 1356   BUN 13 11/05/2022 1641   CREATININE 0.90 11/05/2022 1641   CALCIUM 9.8 11/05/2022 1641   PROT 6.8 11/05/2022 1641   ALBUMIN 4.6 11/05/2022 1641   AST 16 11/05/2022 1641   ALT 16 11/05/2022 1641   ALKPHOS 102 11/05/2022 1641   BILITOT 0.3 11/05/2022 1641   GFRNONAA >60 08/05/2020 1356   GFRAA 70 07/16/2020 1042   Lab Results  Component Value Date   CHOL 228 (H) 07/16/2020   HDL 55 07/16/2020   LDLCALC 150 (H) 07/16/2020   TRIG 130  07/16/2020   CHOLHDL 4.1 07/16/2020   Lab Results  Component Value Date   HGBA1C 4.8 12/31/2022   No results found for: "VITAMINB12" Lab Results  Component Value Date   TSH 3.05 04/16/2021        No data to display               No data to display           ASSESSMENT AND PLAN  48 y.o. year old female  has a past medical history of Anxiety, Bipolar disorder (HCC), Depression, Dry skin, Feeling of incomplete bladder emptying, GERD (gastroesophageal reflux disease), Herpes simplex virus (HSV) infection (10/10/2007), History of molar pregnancy, antepartum (12/12/2008), History of tobacco use (06/09/2009), Hypercholesteremia, Hypertonicity of bladder (04/14/2012), Nocturia, Obesity (BMI 30.0-34.9) (08/24/2019), Sleep apnea, Symptomatic mammary hypertrophy (08/15/2020), Urge urinary incontinence, Vitamin D deficiency, and Wears contact lenses. here with    Intractable migraine without aura and without status migrainosus  Recurrent headache  Kimberly Browning reports having 12-15 headache days a month. She is taking multiple sedating medication for mood management. I would like to start her on Qulipta for the safety profile and low side effect potential. She was advised to start 1 tablet daily. 28 sample tablets given in the office. She will continue ibuprofen for now but may consider abortive meds in the future, if needed. I have encouraged her to increase water intake. Continue close follow up with psychiatry. Healthy lifestyle habits encouraged. She will follow up with PCP as directed.  She will return to see me in 6 months, sooner if needed. She verbalizes understanding and agreement with this plan.    No orders of the defined types were placed in this encounter.    Meds ordered this encounter  Medications   Atogepant (QULIPTA) 60 MG TABS    Sig: Take 1 tablet (60 mg total) by mouth daily.    Dispense:  90 tablet    Refill:  3    Supervising Provider:   Anson Fret  [id number]    I spent 30 minutes of face-to-face and non-face-to-face time with patient.  This included previsit chart review, lab review, study review, order entry, electronic health record documentation, patient education.   Shawnie Dapper, MSN, FNP-C 08/17/2023, 4:48 PM  [facility] [address] [place], [place] [address] [phone]

## 2023-08-04 ENCOUNTER — Ambulatory Visit (INDEPENDENT_AMBULATORY_CARE_PROVIDER_SITE_OTHER): Payer: Medicare Other | Admitting: Family Medicine

## 2023-08-04 ENCOUNTER — Encounter: Payer: Self-pay | Admitting: Family Medicine

## 2023-08-04 ENCOUNTER — Ambulatory Visit: Payer: Medicare Other | Admitting: Family Medicine

## 2023-08-04 ENCOUNTER — Telehealth: Payer: Self-pay | Admitting: Family Medicine

## 2023-08-04 VITALS — BP 105/66 | HR 68 | Ht 64.0 in | Wt 131.0 lb

## 2023-08-04 DIAGNOSIS — R519 Headache, unspecified: Secondary | ICD-10-CM | POA: Diagnosis not present

## 2023-08-04 DIAGNOSIS — G43019 Migraine without aura, intractable, without status migrainosus: Secondary | ICD-10-CM

## 2023-08-04 MED ORDER — QULIPTA 60 MG PO TABS: 60.0000 mg | ORAL_TABLET | Freq: Every day | ORAL | 3 refills | Status: DC

## 2023-08-04 NOTE — Patient Instructions (Signed)
 Below is our plan:  We will start Qulipta 60mg  every day. Drink at least 40 ounces of water daily.  Please make sure you are staying well hydrated. I recommend 50-60 ounces daily. Well balanced diet and regular exercise encouraged. Consistent sleep schedule with 6-8 hours recommended.   Please continue follow up with care team as directed.   Follow up with me in 6 months   You may receive a survey regarding today's visit. I encourage you to leave honest feed back as I do use this information to improve patient care. Thank you for seeing me today!

## 2023-08-04 NOTE — Telephone Encounter (Signed)
 Patient stopped by check out and stated that she forgot to ask why her blood pressure was low during her appointment. Patient would like a call to discuss

## 2023-08-05 NOTE — Progress Notes (Signed)
    SUBJECTIVE:   CHIEF COMPLAINT / HPI: L big toe pain  L Big Toe Pain Patient reports that her L big toe has been hurting recently for about a week. She reports that it hurts primarily when she presses her foot against something. She has to take her shoe off to help improve her symptoms. She denies wearing shoes that are too tight for her. Denies any redness, bleeding, bruising, or any trauma. Hasn't tried any medications or topical agents.  PERTINENT  PMH / PSH: Recurrent HA, GERD, bipolar disorder, contact dermatitis   OBJECTIVE:   BP 100/70   Pulse 78   Ht 5\' 3"  (1.6 m)   Wt 130 lb 9.6 oz (59.2 kg)   LMP 07/31/2020   SpO2 100%   BMI 23.13 kg/m   General: Awake and Alert in NAD HEENT: NCAT. Sclera anicteric. No rhinorrhea. No oropharyngeal erythema. Cardiovascular: RRR. No M/R/G Respiratory: CTAB, normal WOB on RA. No wheezing, crackles, rhonchi, or diminished breath sounds. Abdomen: Soft, non-tender, non-distended. Bowel sounds normoactive Extremities: Able to move all extremities equally. No BLE edema, no deformities or significant joint findings. L great toe TTP over nail and surrounding skin. Skin: Warm and dry. No abrasions or rashes noted. Neuro: No focal neurological deficits.   ASSESSMENT/PLAN:   Assessment & Plan Onychogryphosis L great toe pain and tenderness noted on exam. No signs of infection. This could be secondary to abnormal growth s/p previous nail removal in 06/2022.  - Advised patient to wear supportive, protective footwear, soak feet in warm water, and apply Vaseline to the surrounding skin to prevent dryness and cracking  [name], DO [facility]

## 2023-08-11 ENCOUNTER — Ambulatory Visit: Payer: Medicare Other | Admitting: Family Medicine

## 2023-08-11 ENCOUNTER — Encounter: Payer: Self-pay | Admitting: Family Medicine

## 2023-08-11 VITALS — BP 100/70 | HR 78 | Ht 63.0 in | Wt 130.6 lb

## 2023-08-11 DIAGNOSIS — L602 Onychogryphosis: Secondary | ICD-10-CM | POA: Diagnosis present

## 2023-08-11 DIAGNOSIS — R519 Headache, unspecified: Secondary | ICD-10-CM | POA: Insufficient documentation

## 2023-08-11 NOTE — Patient Instructions (Signed)
 It was great to see you today! Thank you for choosing [facility] for your primary care. Kimberly Browning was seen for L great toe pain.  Today we addressed: L Great Toe Pain Please wear supportive, protective footwear, soak feet in warm water, and apply Vaseline to the surrounding skin to prevent dryness and cracking.  You should return to our clinic No follow-ups on file. Please arrive 15 minutes before your appointment to ensure smooth check in process.  We appreciate your efforts in making this happen.  Thank you for allowing me to participate in your care, [name], DO 08/11/2023, 3:20 PM PGY-1, [facility]

## 2023-08-15 ENCOUNTER — Other Ambulatory Visit (HOSPITAL_COMMUNITY): Payer: Self-pay

## 2023-08-17 ENCOUNTER — Ambulatory Visit: Payer: Medicare Other | Admitting: Dermatology

## 2023-08-17 ENCOUNTER — Telehealth: Payer: Self-pay | Admitting: Pharmacy Technician

## 2023-08-17 ENCOUNTER — Other Ambulatory Visit (HOSPITAL_COMMUNITY): Payer: Self-pay

## 2023-08-17 NOTE — Telephone Encounter (Signed)
 Recurrent headache  - Primary R51.9

## 2023-08-17 NOTE — Telephone Encounter (Signed)
 Pharmacy Patient Advocate Encounter   Received notification from CoverMyMeds that prior authorization for Qulipta 60MG  tablets is required/requested.   Insurance verification completed.   The patient is insured through [place] .   Per test claim: PA required; PA started via CoverMyMeds. KEY [id number] . Please see clinical question(s) below that I am not finding the answer to in her chart and advise. Recurrent headache diagnosis will not get approved

## 2023-08-17 NOTE — Telephone Encounter (Signed)
 Unfortunately a Prior Authorization with a diagnosis of Headache would not get approved.  Has to have a diagnosis of Migraine.

## 2023-08-18 ENCOUNTER — Ambulatory Visit (INDEPENDENT_AMBULATORY_CARE_PROVIDER_SITE_OTHER): Admitting: Family Medicine

## 2023-08-18 ENCOUNTER — Encounter: Payer: Self-pay | Admitting: Family Medicine

## 2023-08-18 VITALS — BP 120/74 | HR 77 | Ht 62.0 in | Wt 130.0 lb

## 2023-08-18 DIAGNOSIS — E559 Vitamin D deficiency, unspecified: Secondary | ICD-10-CM

## 2023-08-18 DIAGNOSIS — D539 Nutritional anemia, unspecified: Secondary | ICD-10-CM | POA: Diagnosis not present

## 2023-08-18 DIAGNOSIS — R5383 Other fatigue: Secondary | ICD-10-CM | POA: Diagnosis present

## 2023-08-18 NOTE — Patient Instructions (Addendum)
 It was great to see you today! Thank you for choosing [facility] for your primary care. Kimberly Browning was seen for fatigue.  Today we addressed: Fatigue - Your fatigue symptoms are likely multifactorial due to your diet, lack of sleep, medical health, and mental health.  I advise you to try to sleep and wake up at the same time every day, eat a nutritious diet, and get regular daily exercise.  We will be checking some labs on you today to determine if there are any medical causes related to your fatigue. No show - We discussed our no-show dismissal policy at the clinic today since your name appeared on the no-show list.  No-shows can impact not only your health, my time due to the preparation time that I take for each of our visits, and cause others to lose the opportunity to see a doctor since the time slot was protected for you.  I have discussed this with my administration and we have decided that we will give you 1 more opportunity.  However this means that if there are any no-shows in the future, you will unfortunately be dismissed.  We are checking some labs today. I will send you a MyChart message with your results, per your preference. If you do not hear about your labs in the next 2 weeks, please call the office.  You should return to our clinic No follow-ups on file. Please arrive 15 minutes before your appointment to ensure smooth check in process.  We appreciate your efforts in making this happen.  Thank you for allowing me to participate in your care, [name], DO 08/18/2023, 2:43 PM PGY-1, [facility]

## 2023-08-18 NOTE — Progress Notes (Signed)
    SUBJECTIVE:   CHIEF COMPLAINT / HPI: Fatigue  Patient endorses fatigue for quite some time but can't tell me how long specifically. Some of her previous symptoms include headache, decreased energy, difficulty sleeping appropriately, and a poor diet. Believes that she has been through menopause and hasn't had a period in a long time. She also not sexually active. She endorses taking her medications as prescribed.   PERTINENT  PMH / PSH:  GERD, bipolar disorder, contact dermatitis, nutritional deficit, weight loss  OBJECTIVE:   BP 120/74   Pulse 77   Ht 5\' 2"  (1.575 m)   Wt 130 lb (59 kg)   LMP 07/31/2020   SpO2 100%   BMI 23.78 kg/m   General: Awake and Alert in NAD HEENT: NCAT. Sclera anicteric. No rhinorrhea. Cardiovascular: RRR. No M/R/G Respiratory: CTAB, normal WOB on RA. No wheezing, crackles, rhonchi, or diminished breath sounds. Abdomen: Soft, non-tender, non-distended. Bowel sounds normoactive Extremities: Able to move all extremities. No BLE edema, no deformities or significant joint findings. Skin: Warm and dry. No abrasions or rashes noted. Neuro: No focal neurological deficits.  ASSESSMENT/PLAN:   Assessment & Plan Fatigue, unspecified type Endorses excessive tiredness even after she sleeps an adequate amount.  She is not eating a nutritious diet at this time.  After discussing with her sister she feels like her symptoms are related to fatigue and would like lab work. - Anemia profile B - TSH - Advised patient to practice sleep hygiene, eat a nutritious diet, and exercise. Vitamin D deficiency Previously has had low vitamin D levels to 27.1. - Recheck Vitamin D Nutritional anemia Patient has a poor diet.  Does not really enjoy eating protein very much.  Previously she endorses eating a lot of seaweed, snacks such as rice krispies, soft pretzels, and crackers. - Anemia profile B - Advised a well balanced nutritious diet  No-Show Discussion Discussed the  no-show dismissal policy with the patient at the clinic today.  We discussed that no-shows can impact not only her health, physician time due to the preparation time that I take for each of our visits, and cause others to lose the opportunity to see a physician since the time slot was protected for each patient.  Notified the patient that this was discussed with admin and we decided to give her one more opportunity, however this means that if she no-shows one more time, she will be dismissed. Patient understood.   [name], DO [facility]

## 2023-08-19 ENCOUNTER — Encounter: Payer: Self-pay | Admitting: Family Medicine

## 2023-08-19 LAB — ANEMIA PROFILE B
Basophils Absolute: 0.1 10*3/uL (ref 0.0–0.2)
Basos: 1 %
EOS (ABSOLUTE): 0.2 10*3/uL (ref 0.0–0.4)
Eos: 3 %
Ferritin: 34 ng/mL (ref 15–150)
Folate: 7.1 ng/mL (ref >3.0)
Hematocrit: 42.4 % (ref 34.0–46.6)
Hemoglobin: 13.6 g/dL (ref 11.1–15.9)
Immature Grans (Abs): 0 10*3/uL (ref 0.0–0.1)
Immature Granulocytes: 0 %
Iron Saturation: 32 % (ref 15–55)
Iron: 102 ug/dL (ref 27–159)
Lymphocytes Absolute: 1.8 10*3/uL (ref 0.7–3.1)
Lymphs: 27 %
MCH: 27.1 pg (ref 26.6–33.0)
MCHC: 32.1 g/dL (ref 31.5–35.7)
MCV: 85 fL (ref 79–97)
Monocytes Absolute: 0.4 10*3/uL (ref 0.1–0.9)
Monocytes: 7 %
Neutrophils Absolute: 4.3 10*3/uL (ref 1.4–7.0)
Neutrophils: 62 %
Platelets: 302 10*3/uL (ref 150–450)
RBC: 5.02 x10E6/uL (ref 3.77–5.28)
RDW: 12.8 % (ref 11.7–15.4)
Retic Ct Pct: 1.2 % (ref 0.6–2.6)
Total Iron Binding Capacity: 322 ug/dL (ref 250–450)
UIBC: 220 ug/dL (ref 131–425)
Vitamin B-12: 450 pg/mL (ref 232–1245)
WBC: 6.8 10*3/uL (ref 3.4–10.8)

## 2023-08-19 LAB — VITAMIN D 25 HYDROXY (VIT D DEFICIENCY, FRACTURES): Vit D, 25-Hydroxy: 40.1 ng/mL (ref 30.0–100.0)

## 2023-08-19 LAB — TSH: TSH: 2.74 u[IU]/mL (ref 0.450–4.500)

## 2023-08-19 NOTE — Telephone Encounter (Signed)
 Pharmacy Patient Advocate Encounter   Received notification from CoverMyMeds that prior authorization for Qulipta 60MG  tablets is required/requested.   Insurance verification completed.   The patient is insured through [employer] .   Per test claim: PA required; PA submitted to above mentioned insurance via CoverMyMeds Key/confirmation #/EOC [id number] Status is pending

## 2023-08-22 ENCOUNTER — Telehealth: Payer: Self-pay

## 2023-08-22 ENCOUNTER — Encounter: Payer: Self-pay | Admitting: Family Medicine

## 2023-08-22 NOTE — Telephone Encounter (Signed)
 Pharmacy Patient Advocate Encounter  Received notification from [facility] that Prior Authorization for Qulipta 60MG  tablets i  has been APPROVED from 08/19/2023 to 06/06/2024   PA #/Case ID/Reference #: [id number]

## 2023-08-22 NOTE — Telephone Encounter (Signed)
 Patient calls nurse line regarding concerns with results from recent office visit.   She states that she does not understand how her iron is normal due to her continued fatigue.  She is asking to speak with provider regarding follow up plan for fatigue, given normal results.   Will forward request to PCP.   Veronda Prude, RN

## 2023-09-05 ENCOUNTER — Ambulatory Visit (INDEPENDENT_AMBULATORY_CARE_PROVIDER_SITE_OTHER): Admitting: Student

## 2023-09-05 ENCOUNTER — Encounter: Payer: Self-pay | Admitting: Student

## 2023-09-05 VITALS — BP 127/76 | HR 83 | Ht 62.0 in | Wt 129.2 lb

## 2023-09-05 DIAGNOSIS — L26 Exfoliative dermatitis: Secondary | ICD-10-CM | POA: Insufficient documentation

## 2023-09-05 NOTE — Progress Notes (Signed)
    SUBJECTIVE:   CHIEF COMPLAINT / HPI:   Kimberly Browning is a 48 y.o. female presenting for dry skin of her left foot.  Patient reports has been ongoing since she was in eighth grade approximately 48 years old.  She does not like to wear socks because her feet sweat.  She was tried to be seen by podiatrist but unfortunately the appointment was canceled.  She has an appointment with dermatology in 2 weeks.  She reports she has no pain.  Sometimes she will have occasional cracking of the skin which will cause pain.  The bottoms of her feet normally get better in the summer.  PERTINENT  PMH / PSH: reviewed and updated.  OBJECTIVE:   BP 127/76   Pulse 83   Ht 5\' 2"  (1.575 m)   Wt 129 lb 3.2 oz (58.6 kg)   LMP 07/31/2020   SpO2 94%   BMI 23.63 kg/m   General: well appearing, no acute distres CV: regular rate, regular rhythm, no murmurs on exam  Pulm: clear, no wheezing, no increased work of breathing  Abd: soft, non-tender, non-distended  Skin: warm, dry skin located on the left foot, right sole of the foot and mild peeling of skin in the palms of the hands.  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  Patient will follow-up with dermatology in 2 weeks.  Discussed keeping the area moisturized.    Glendale Chard, DO [facility]

## 2023-09-05 NOTE — Patient Instructions (Signed)
 I am sending a referral to dermatology. You should keep the area moisturized.

## 2023-09-05 NOTE — Assessment & Plan Note (Signed)
 Diagnosis most consistent with keratolysis exfoliative.  Patient will follow-up with dermatology in 2 weeks.  Discussed keeping the area moisturized.

## 2023-09-08 ENCOUNTER — Ambulatory Visit: Payer: Medicare Other | Admitting: Dermatology

## 2023-09-08 ENCOUNTER — Ambulatory Visit: Admitting: Family Medicine

## 2023-09-08 ENCOUNTER — Encounter: Payer: Self-pay | Admitting: Family Medicine

## 2023-09-08 VITALS — BP 106/78 | HR 89 | Ht 62.0 in | Wt 129.4 lb

## 2023-09-08 DIAGNOSIS — B353 Tinea pedis: Secondary | ICD-10-CM

## 2023-09-08 DIAGNOSIS — M79675 Pain in left toe(s): Secondary | ICD-10-CM | POA: Diagnosis present

## 2023-09-08 MED ORDER — KETOCONAZOLE 2 % EX CREA
1.0000 | TOPICAL_CREAM | Freq: Every day | CUTANEOUS | 0 refills | Status: AC
Start: 2023-09-08 — End: 2023-10-08

## 2023-09-08 NOTE — Patient Instructions (Addendum)
 Please use ketoconazole cream on your left foot once daily for the next 30 days.  Please follow-up with dermatology as scheduled.  Please return on Monday, 4/7 at 3:10 PM for your toenail removal  Recommend soaking your left foot and Epsom salt or soap and water for 15 minutes twice a day

## 2023-09-08 NOTE — Progress Notes (Signed)
    SUBJECTIVE:   CHIEF COMPLAINT / HPI:   Left great toe pain Reports having her toenail removed twice in the past Is now growing back and starting to cause pain Left great toe feels painful and swollen, little red No bleeding or drainage No recent injury    She is seeing dermatology soon for her dry skin on her feet Has not seen podiatry   PERTINENT  PMH / PSH: Bipolar, dry skin  OBJECTIVE:   BP 106/78   Pulse 89   Ht 5\' 2"  (1.575 m)   Wt 129 lb 6.4 oz (58.7 kg)   LMP 07/31/2020   SpO2 100%   BMI 23.67 kg/m   General: NAD, pleasant, able to participate in exam Respiratory: No respiratory distress Skin: warm and dry, no rashes noted Psych: Normal affect and mood  Left foot: Left great toe slightly erythematous and swollen compared to right.  Significant dryness and skin irritation/scaling noted between toes and along bottom of foot. Slight initial signs of ingrown toenail laterally Tender throughout L great toe around toenail. No significant bony tenderness No significant fluid collections, wounds NVI, good perfusion/cap refill   ASSESSMENT/PLAN:   Assessment & Plan Pain around toenail, left foot Seems to have early signs of ingrown toenail, unfortunately unable to perform toenail removal during clinic today due to time constraints, attempted to schedule patient for a 40-minute slot but patient prefers to call back and schedule on her own.  In the meantime, encouraged twice daily soaks/Epsom salt baths.  Additionally, with the scaliness and dryness of her foot there is some concern for tinea pedis.  Does not appear to have onychomycosis.  Rx ketoconazole daily at least until she is able to follow-up with dermatology.  At next visit, consider shave biopsy for further evaluation.   Vonna Drafts, MD [facility]

## 2023-09-10 DIAGNOSIS — M25561 Pain in right knee: Secondary | ICD-10-CM | POA: Insufficient documentation

## 2023-09-11 NOTE — Progress Notes (Unsigned)
     SUBJECTIVE:   CHIEF COMPLAINT / HPI: hot flashes  Hot Flashes Patient reports that she has hot flashes 3x/day. She describes being hot during the episodes and has to go outside to cool off.  Denies having hot flashes at night.  She has been having hot flashes since she started menopause, but reports them being worse.   PERTINENT PMH / PSH: GERD, bipolar disorder, contact dermatitis  OBJECTIVE:   BP 102/76   Pulse 71   Temp 99.1 F (37.3 C) (Oral)   Ht 5\' 4"  (1.626 m)   Wt 59.7 kg   LMP 07/31/2020   SpO2 100%   BMI 22.59 kg/m   General: Awake and Alert in NAD HEENT: NCAT. Sclera anicteric. No rhinorrhea. Cardiovascular: RRR. No M/R/G Respiratory: CTAB, normal WOB on RA. No wheezing, crackles, rhonchi, or diminished breath sounds. Abdomen: Soft, non-tender, non-distended. Bowel sounds normoactive Extremities: Able to move all extremities. No BLE edema, no deformities or significant joint findings. Skin: Warm and dry. Knee abrasions noted bilaterally d/t fall while roller skating Neuro: No focal neurological deficits.  ASSESSMENT/PLAN:   Assessment & Plan Vasomotor symptoms due to menopause Hot flashes since starting menopause, has tried behavioral modifications, but doesn't seem to help. Discussed more behavioral modifications today vs medication and she would like to try medications.  Used caution d/t her hx of bipolar disorder and medications: Lamictal, Hydroxyzine, and Temazepam. - Prescribed Citalopram 20 mg daily - Precautions given if she notices any mood/behavioral changes - Advised contacting her psychiatrist prior to starting medication, patient agreed and understood the plan  [name], DO [facility]

## 2023-09-12 ENCOUNTER — Ambulatory Visit: Payer: Self-pay | Admitting: Student

## 2023-09-13 ENCOUNTER — Ambulatory Visit: Admitting: Student

## 2023-09-16 ENCOUNTER — Ambulatory Visit: Admitting: Family Medicine

## 2023-09-16 ENCOUNTER — Encounter: Payer: Self-pay | Admitting: Family Medicine

## 2023-09-16 VITALS — BP 102/76 | HR 71 | Temp 99.1°F | Ht 64.0 in | Wt 131.6 lb

## 2023-09-16 DIAGNOSIS — N951 Menopausal and female climacteric states: Secondary | ICD-10-CM | POA: Diagnosis present

## 2023-09-16 MED ORDER — CITALOPRAM HYDROBROMIDE 20 MG PO TABS
20.0000 mg | ORAL_TABLET | Freq: Every day | ORAL | 3 refills | Status: DC
Start: 2023-09-16 — End: 2024-03-21

## 2023-09-16 NOTE — Patient Instructions (Addendum)
 It was great to see you today! Thank you for choosing [facility] for your primary care. Kimberly Browning was seen for hot flashes.  Today we addressed: Hot flashes  We discussed behavioral modifications (cool temperatures, avoiding triggers such as alcohol or caffeine, practicing stress-reducing techniques such as deep breathing, yoga, or meditation to help). We will also prescribe you Citalopram 20 mg daily to help with the hot flashes. However, before you start to taking this medication I advise you to discuss this medication with your psychiatrist, to confirm its safety with your other medications and your diagnoses.   Stop the medication  or discuss further with your psychiatrist: - If you determine that you have an elevated mood or changes in your behavior with starting this medication since it could be due to an interaction  You should return to our clinic No follow-ups on file. Please arrive 15 minutes before your appointment to ensure smooth check in process.  We appreciate your efforts in making this happen.  Thank you for allowing me to participate in your care, [name], DO 09/16/2023, 2:09 PM PGY-1, [facility]

## 2023-09-19 ENCOUNTER — Telehealth: Payer: Self-pay

## 2023-09-19 NOTE — Telephone Encounter (Signed)
 Called patient and informed of provider message.   She states that she is waiting on returned call from her psychiatrist and will go from there.   She will call back if there are any other concerns.   Elsie Halo, RN

## 2023-09-19 NOTE — Telephone Encounter (Signed)
 Patient calls nurse line regarding concerns with Citalopram prescription. She reports that she has concerns as listed side effect is insomnia.   She does not want to take a medication that could further worsen her sleep issues. She is requesting another medication that may be able to help her with her hot flashes.   Will forward to PCP for review.   Elsie Halo, RN

## 2023-09-20 ENCOUNTER — Encounter: Payer: Self-pay | Admitting: Dermatology

## 2023-09-20 ENCOUNTER — Other Ambulatory Visit: Payer: Self-pay | Admitting: Family Medicine

## 2023-09-20 ENCOUNTER — Telehealth: Payer: Self-pay

## 2023-09-20 ENCOUNTER — Ambulatory Visit: Admitting: Dermatology

## 2023-09-20 DIAGNOSIS — R21 Rash and other nonspecific skin eruption: Secondary | ICD-10-CM

## 2023-09-20 DIAGNOSIS — Z79899 Other long term (current) drug therapy: Secondary | ICD-10-CM

## 2023-09-20 DIAGNOSIS — Z7189 Other specified counseling: Secondary | ICD-10-CM

## 2023-09-20 DIAGNOSIS — L209 Atopic dermatitis, unspecified: Secondary | ICD-10-CM

## 2023-09-20 DIAGNOSIS — L2089 Other atopic dermatitis: Secondary | ICD-10-CM

## 2023-09-20 DIAGNOSIS — N951 Menopausal and female climacteric states: Secondary | ICD-10-CM

## 2023-09-20 DIAGNOSIS — L299 Pruritus, unspecified: Secondary | ICD-10-CM | POA: Diagnosis not present

## 2023-09-20 DIAGNOSIS — L219 Seborrheic dermatitis, unspecified: Secondary | ICD-10-CM | POA: Diagnosis not present

## 2023-09-20 MED ORDER — MOMETASONE FUROATE 0.1 % EX CREA: 1.0000 | TOPICAL_CREAM | CUTANEOUS | 4 refills | Status: DC

## 2023-09-20 MED ORDER — KETOCONAZOLE 2 % EX SHAM: 1.0000 | MEDICATED_SHAMPOO | CUTANEOUS | 3 refills | Status: DC

## 2023-09-20 NOTE — Progress Notes (Signed)
   New Patient Visit   Subjective  Kimberly Browning is a 48 y.o. female who presents for the following: Feet dry, yrs, itchy, has tried vaseline in past, was given Ketoconazole 2% cr and pt states did not help The patient has spots, moles and lesions to be evaluated, some may be new or changing and the patient may have concern these could be cancer.  The following portions of the chart were reviewed this encounter and updated as appropriate: medications, allergies, medical history  Review of Systems:  No other skin or systemic complaints except as noted in HPI or Assessment and Plan.  Objective  Well appearing patient in no apparent distress; mood and affect are within normal limits.  A focused examination was performed of the following areas: feet  Relevant exam findings are noted in the Assessment and Plan.   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 Condition is bothersome/symptomatic for patient. Currently flared. Atopic dermatitis (eczema) is a chronic, relapsing, pruritic condition that can significantly affect quality of life. It is often associated with allergic rhinitis and/or asthma and can require treatment with topical medications, phototherapy, or in severe cases biologic injectable medication (Dupixent; Adbry) or Oral JAK inhibitors. Treatment Plan: Start Mometasone cr qd/bid to hands and feet 5d/wk until clear then prn flares Start moisturizer qd  Recommend gentle skin care.  Topical steroids (such as triamcinolone, fluocinolone, fluocinonide, mometasone, clobetasol, halobetasol, betamethasone, hydrocortisone) can cause thinning and lightening of the skin if they are used for too long in the same area. Your physician has selected the right strength medicine for your problem and area affected on  the body. Please use your medication only as directed by your physician to prevent side effects.   SEBORRHEIC DERMATITIS with PRURITIS Scalp Exam: scalp clear today Chronic and persistent condition with duration or expected duration over one year. Condition is symptomatic/ bothersome to patient. Not currently at goal.  Seborrheic Dermatitis is a chronic persistent rash characterized by pinkness and scaling most commonly of the mid face but also can occur on the scalp (dandruff), ears; mid chest, mid back and groin.  It tends to be exacerbated by stress and cooler weather.  People who have neurologic disease may experience new onset or exacerbation of existing seborrheic dermatitis.  The condition is not curable but treatable and can be controlled. Treatment Plan: Start Ketoconazole 2% shampoo 2x/wk, let sit 5 minutes and rinse out     Return in about 4 months (around 01/20/2024) for Atopic Dermm, Seb Derm.  I, Rollie Clipper, RMA, am acting as scribe for Celine Collard, MD .   Documentation: I have reviewed the above documentation for accuracy and completeness, and I agree with the above.  Celine Collard, MD

## 2023-09-20 NOTE — Patient Instructions (Signed)

## 2023-09-20 NOTE — Telephone Encounter (Signed)
 Patient calls nurse line requesting a referral to GYN.   It appears she was referred back in September for PMB.  She reports she never heard from anyone and states "that's not the issue anymore." She reports she wants to see GYN for menopausal concerns.   Will forward to PCP to update referral with appropriate diagnoses.

## 2023-09-22 ENCOUNTER — Ambulatory Visit

## 2023-09-27 ENCOUNTER — Ambulatory Visit

## 2023-10-17 ENCOUNTER — Ambulatory Visit: Admitting: Advanced Practice Midwife

## 2023-10-17 ENCOUNTER — Encounter: Payer: Self-pay | Admitting: Advanced Practice Midwife

## 2023-10-17 VITALS — BP 116/73 | HR 64 | Ht 64.0 in | Wt 129.0 lb

## 2023-10-17 DIAGNOSIS — N951 Menopausal and female climacteric states: Secondary | ICD-10-CM | POA: Diagnosis not present

## 2023-10-17 MED ORDER — ESTRADIOL 0.1 MG/GM VA CREA
TOPICAL_CREAM | VAGINAL | 11 refills | Status: DC
Start: 2023-10-17 — End: 2023-12-14

## 2023-10-17 NOTE — Progress Notes (Signed)
   GYNECOLOGY PROGRESS NOTE  History:  48 y.o. G1P1001 with intact uterus and ovaries presents to [facility] office today for problem gyn visit. She reports hot flashes x 3-4 years.  She is unsure of her last menstrual period but it was years ago, possibly around age 48.  She had some light bleeding once after menopause, but endometrial biopsy was normal and she has never had bleeding since then. Hot flashes are frequent, severe, and not improved with OTC and herbal remedies. She is wearing clothing in layers and other conservative measures without improvement. Her sister has used a vaginal estrogen cream that helped with hot flashes and she would like to start there. She denies any vaginal symptoms but is not currently sexually active. She denies h/a, dizziness, shortness of breath, n/v, or fever/chills.    The following portions of the patient's history were reviewed and updated as appropriate: allergies, current medications, past family history, past medical history, past social history, past surgical history and problem list. Last pap smear on 10/08/21 was normal, neg HRHPV.  Health Maintenance Due  Topic Date Due   DTaP/Tdap/Td (3 - Td or Tdap) 04/01/2021   COVID-19 Vaccine (4 - 2024-25 season) 02/06/2023   Medicare Annual Wellness (AWV)  11/23/2023     Review of Systems:  Pertinent items are noted in HPI.   Objective:  Physical Exam Blood pressure 116/73, pulse 64, height 5\' 4"  (1.626 m), weight 129 lb (58.5 kg), last menstrual period 07/31/2020. VS reviewed, nursing note reviewed,  Constitutional: well developed, well nourished, no distress HEENT: normocephalic CV: normal rate Pulm/chest wall: normal effort Breast Exam: deferred Abdomen: soft Neuro: alert and oriented x 3 Skin: warm, dry Psych: affect normal Pelvic exam: Deferred  Assessment & Plan:  1. Hot flashes due to menopause (Primary) --While pt is bipolar on medications, which can have side effects, hot flashes in pt  age 48 with LMP years ago is most likely due to drop in estrogen due to menopause.   --Pt desires topical treatment as this has helped for her sister, so I will start with lowest dose of Estrace, covered by pt Medicare.  Pt to f/u with MD for dosing adjustment/change in treatment plan.  - estradiol (ESTRACE VAGINAL) 0.1 MG/GM vaginal cream; Use 0.5 g daily for 2 weeks, then twice weekly. You may increase to 2-4 g twice weekly as needed for symptoms.  Dispense: 42.5 g; Refill: 11   Return in about 3 months (around 01/17/2024) for MD only.   Arlester Bence, CNM 2:10 PM

## 2023-10-17 NOTE — Progress Notes (Signed)
 Pt c/o hot flashes for the last 3 years.

## 2023-10-20 ENCOUNTER — Telehealth: Payer: Self-pay

## 2023-10-20 NOTE — Telephone Encounter (Signed)
 Returned call, no answer, left vm

## 2023-10-20 NOTE — Telephone Encounter (Signed)
 Returned call, attempted to instruct pt on using estradiol cream, pt did not understand, advised to send picture in mychart

## 2023-10-27 ENCOUNTER — Telehealth: Payer: Self-pay

## 2023-10-27 NOTE — Telephone Encounter (Signed)
 Pt seeking alternative for hot flashes. Message sent to provider for further directive.

## 2023-10-30 ENCOUNTER — Encounter: Payer: Self-pay | Admitting: Advanced Practice Midwife

## 2023-10-30 ENCOUNTER — Other Ambulatory Visit: Payer: Self-pay | Admitting: Advanced Practice Midwife

## 2023-11-01 ENCOUNTER — Ambulatory Visit (INDEPENDENT_AMBULATORY_CARE_PROVIDER_SITE_OTHER): Payer: Self-pay | Admitting: Physician Assistant

## 2023-11-01 DIAGNOSIS — Z91199 Patient's noncompliance with other medical treatment and regimen due to unspecified reason: Secondary | ICD-10-CM

## 2023-11-01 NOTE — Progress Notes (Signed)
 Patient no-showed today's appointment.

## 2023-11-03 ENCOUNTER — Telehealth: Payer: Self-pay

## 2023-11-03 NOTE — Telephone Encounter (Signed)
-----   Message from [place] [facility] sent at 11/03/2023  1:13 PM EDT ----- Regarding: Patient needs different medication for hot flashes Patient called and is having issues with the cream medication prescribed for hot flashes.  Is wanting something different.  Please reach out.  Thank you,

## 2023-11-03 NOTE — Telephone Encounter (Signed)
 Call placed to patient. She is requesting a different cream for her hot flashes. She states that the cream she has now will not stay in, and is causing increased urination. She states that a family member has a cream that you put on the legs, instead of the vagina, that has worked well for her. Pt is unsure of the name. RN verified pharmacy in chart. RN advised that message would be sent.

## 2023-11-07 NOTE — Telephone Encounter (Signed)
 Pt called to follow up on medication change request.   RN returned call to patient. Advised that follow up message would be sent for further directive. Pt verbalized understanding.

## 2023-11-08 NOTE — Telephone Encounter (Signed)
 Call returned to patient. Advised of provider's directive. Pt would like in person visit. Scheduler not available at the time of call. RN to send message for scheduling.

## 2023-11-15 ENCOUNTER — Ambulatory Visit: Admitting: Dermatology

## 2023-11-18 ENCOUNTER — Ambulatory Visit: Admitting: Obstetrics and Gynecology

## 2023-11-29 ENCOUNTER — Encounter: Payer: Self-pay | Admitting: Obstetrics and Gynecology

## 2023-11-29 ENCOUNTER — Ambulatory Visit: Admitting: Obstetrics and Gynecology

## 2023-11-29 VITALS — BP 111/71 | HR 67 | Wt 128.0 lb

## 2023-11-29 DIAGNOSIS — N951 Menopausal and female climacteric states: Secondary | ICD-10-CM | POA: Diagnosis not present

## 2023-11-29 MED ORDER — MEDROXYPROGESTERONE ACETATE 5 MG PO TABS: 5.0000 mg | ORAL_TABLET | Freq: Every day | ORAL | 3 refills | Status: DC

## 2023-11-29 MED ORDER — ESTRADIOL 0.5 MG/0.5GM TD GEL: TRANSDERMAL | 5 refills | Status: DC

## 2023-11-29 NOTE — Progress Notes (Signed)
 Pt is in office to discuss change in medication. Pt does not like using vaginal cream, states make her urinate a lot. Pt having hot flashes daily.

## 2023-11-29 NOTE — Progress Notes (Signed)
 48 yo P1 postmenopausal for 5 years returning for management of vasomotor symptoms. Patient used vaginal estrogen cream for a month without improvement in hot flashes. She also reports a lot of urinary irritation. She is requesting other treatment options  Past Medical History:  Diagnosis Date   Anxiety    Bipolar disorder (HCC)    Depression    Dry skin    hands   Feeling of incomplete bladder emptying    GERD (gastroesophageal reflux disease)    Herpes simplex virus (HSV) infection 10/10/2007   Qualifier: Diagnosis of   By: Joyice MD, Harlene SCULL SNOMED Dx Update Oct 2024     History of molar pregnancy, antepartum 12/12/2008   History of tobacco use 06/09/2009   Qualifier: Diagnosis of   By: Manford Longs      History of smokeing 4-6 cigarettes/day since she was 48 years old.   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 Is motivated for weight loss. Not physically active.  Has trialed Metformin - d/c'd due to GI upset, Pheniramine - unable to tolerate, Saxenda  3mg  but was discontinued as it was not appropriate per last PCP. Has had success with Phentermine 37.5mg  but developed insomnia - opted to discontinued given unlikely long term improvement. Self discontinued Wellb   Sleep apnea    Symptomatic mammary hypertrophy 08/15/2020   Urge urinary incontinence    Vitamin D  deficiency    Wears contact lenses    Past Surgical History:  Procedure Laterality Date   BREAST REDUCTION SURGERY  05/10/2011   Procedure: MAMMARY REDUCTION BILATERAL (BREAST);  Surgeon: Elna LITTIE Pick;  Location: [facility];  Service: Plastics;  Laterality: Bilateral;  bilateral breast reduction   DILATION AND EVACUATION  12/26/2008   x 2   INTERSTIM IMPLANT PLACEMENT N/A 07/09/2014   Procedure:  RENNA IMPLANT FIRST STAGE;  Surgeon: Glendia DELENA Elizabeth, MD;  Location: [facility];  Service: Urology;  Laterality: N/A;   INTERSTIM IMPLANT PLACEMENT N/A 07/09/2014   Procedure: RENNA IMPLANT SECOND STAGE;  Surgeon: Glendia DELENA Elizabeth, MD;  Location: [facility];  Service: Urology;  Laterality: N/A;   INTRAUTERINE DEVICE (IUD) INSERTION  sept 2011   mirena    WISDOM TOOTH EXTRACTION  as a teenager    Family History  Adopted: Yes  Problem Relation Age of Onset   Headache Neg Hx    Migraines Neg Hx    Social History   Tobacco Use   Smoking status: Former    Current packs/day: 0.00    Average packs/day: 0.3 packs/day for 19.0 years (4.8 ttl pk-yrs)    Types: Cigarettes    Start date: 09/22/2000    Quit date: 09/23/2019    Years since quitting: 4.1    Passive exposure: Past   Smokeless tobacco: Never   Tobacco comments:    Vapes  Vaping Use   Vaping status: Every Day   Substances: Nicotine, Flavoring  Substance Use Topics   Alcohol use: Not Currently   Drug use: No   ROS See pertinent in HPI. All other systems reviewed and non contributory Blood pressure 111/71, pulse 67, weight 128 lb (58.1 kg), last menstrual period 07/31/2020. GENERAL: Well-developed, well-nourished female in no acute  distress.  NEURO: alert and oriented x 3  A/P 48 yo postmenopausal with vasomotor symptoms - Discussed benefits of lifestyle adjustments - cool environment, layering of clothing - Given intact uterus, discussed usage of combined estrogen/progesterone. Offered the Combipatch which the patient declined. Patient states her sister uses estrogen cream on her thighs. Discussed with patient the importance of combined usage of progesterone in this setting and rx provera provided  Patient verbalized understanding and all questions were answered

## 2023-12-01 ENCOUNTER — Other Ambulatory Visit: Payer: Self-pay | Admitting: Obstetrics and Gynecology

## 2023-12-01 MED ORDER — COMBIPATCH 0.05-0.14 MG/DAY TD PTTW: 1.0000 | MEDICATED_PATCH | TRANSDERMAL | 12 refills | Status: DC

## 2023-12-05 DIAGNOSIS — M25532 Pain in left wrist: Secondary | ICD-10-CM | POA: Insufficient documentation

## 2023-12-05 DIAGNOSIS — G5603 Carpal tunnel syndrome, bilateral upper limbs: Secondary | ICD-10-CM | POA: Insufficient documentation

## 2023-12-06 ENCOUNTER — Telehealth: Payer: Self-pay | Admitting: *Deleted

## 2023-12-06 ENCOUNTER — Other Ambulatory Visit: Payer: Self-pay

## 2023-12-06 MED ORDER — ESTRADIOL 0.05 MG/24HR TD PTWK: 0.0500 mg | MEDICATED_PATCH | TRANSDERMAL | 12 refills | Status: DC

## 2023-12-06 NOTE — Telephone Encounter (Signed)
 RTC to pt to follow up on hormone replacement RXs. The office has checked on insurance coverage/ preferred RXs for patient's insurance. The Climara  (estrogen only) patch is covered and Provera  (progesterone pill) is covered. They do not cover any combo patch. Pt advised of above. Pt educated on the importance of using both medications. Use of patch as directed and then taking Provera  every day was explained in detail. Pt verbalized understanding.

## 2023-12-06 NOTE — Telephone Encounter (Signed)
 RTC regarding concern for affordability of hot flash medication. No answer. Left message indicating need for clarification on which medication pt is referring to so that we can further assist her with securing insurance coverage or making a change in therapy as needed. Call back number included. [facility] msg sent.

## 2023-12-08 ENCOUNTER — Telehealth: Payer: Self-pay

## 2023-12-08 NOTE — Telephone Encounter (Signed)
 Returned call, pt stated estradiol  patch came off in pool not long before she put it on, advised to apply another on a new area of clean, dry skin.

## 2023-12-14 ENCOUNTER — Other Ambulatory Visit: Payer: Self-pay

## 2023-12-14 ENCOUNTER — Other Ambulatory Visit: Payer: Self-pay | Admitting: Obstetrics and Gynecology

## 2023-12-14 MED ORDER — ESTRADIOL 0.5 MG/0.5GM TD GEL: TRANSDERMAL | 5 refills | Status: DC

## 2023-12-14 NOTE — Progress Notes (Signed)
 Pt is not liking her estradiol  patches they are not staying on and she is having to change them to frequently. Pt was originally prescribed estradiol  gel which was her preference, but insurance did not cover that. Pt would like to switch back to the gel and pay out of pocket with a good rx coupon.

## 2024-01-03 ENCOUNTER — Telehealth: Payer: Self-pay

## 2024-01-03 NOTE — Telephone Encounter (Signed)
 Patient LVM on nurse line requesting an apt.   She reports she has been feeling short of breath for the last few days. She reports she contributes this to the heat. She reports her symptoms are triggered after being outside for long periods of time.   Patient was speaking in full sentences during her voicemail.   I attempted to call her back, however no answer or option for VM.   Please assist in scheduling an apt if/when she calls back.

## 2024-01-04 NOTE — Telephone Encounter (Signed)
 Patient returns call to nurse line regarding breathing concerns.   She states that she has been having intermittent breathing issues for the past month. Denies chest pain. She is not currently experiencing shortness of breath and is speaking in complete sentences. Patient denies history of asthma or lung disease.   She is also reporting left sided hip pain that has been going on for the last several weeks.   Offered appointment tomorrow or Friday. Patient states that she is unable to come in until next week.   Scheduled patient with Dr. Lennie on 01/12/24.   ED precautions discussed in the meantime.   Chiquita JAYSON English, RN

## 2024-01-12 ENCOUNTER — Ambulatory Visit

## 2024-01-12 ENCOUNTER — Telehealth: Payer: Self-pay

## 2024-01-12 NOTE — Telephone Encounter (Signed)
 Patient calls nurse line reporting a rash.  She reports the rash is around the right side of my lip. She reports it has been present for ~ 2 weeks. She reports it does not hurt only itches.   She denies any fevers, chills, swelling or trouble breathing.   She denies any new foods, lotions or detergents.   Patient has an apt next week on 8/14. She does not wish to come sooner.   Precautions discussed for sooner visit.

## 2024-01-17 ENCOUNTER — Encounter: Payer: Self-pay | Admitting: Dermatology

## 2024-01-17 ENCOUNTER — Ambulatory Visit: Admitting: Dermatology

## 2024-01-17 DIAGNOSIS — R21 Rash and other nonspecific skin eruption: Secondary | ICD-10-CM

## 2024-01-17 DIAGNOSIS — Z7189 Other specified counseling: Secondary | ICD-10-CM

## 2024-01-17 DIAGNOSIS — Z79899 Other long term (current) drug therapy: Secondary | ICD-10-CM

## 2024-01-17 DIAGNOSIS — L219 Seborrheic dermatitis, unspecified: Secondary | ICD-10-CM

## 2024-01-17 MED ORDER — PIMECROLIMUS 1 % EX CREA: TOPICAL_CREAM | Freq: Two times a day (BID) | CUTANEOUS | 2 refills | Status: DC

## 2024-01-17 NOTE — Patient Instructions (Signed)

## 2024-01-17 NOTE — Progress Notes (Signed)
   Follow-Up Visit   Subjective  Kimberly Browning is a 48 y.o. female who presents for the following: Atopic derm vs Psoriasis hands, feet, 37m f/u, Mometasone  did not work, she used one tube and d/c, Seb Derm scalp, 32m f/u, pt did not use Ketoconazole  2% shampoo, using otc Nutrafol and is happy  The following portions of the chart were reviewed this encounter and updated as appropriate: medications, allergies, medical history  Review of Systems:  No other skin or systemic complaints except as noted in HPI or Assessment and Plan.  Objective  Well appearing patient in no apparent distress; mood and affect are within normal limits.  A focused examination was performed of the following areas: Scalp, hands  Relevant exam findings are noted in the Assessment and Plan.   Assessment & Plan   RASH - ATOPIC DERMATITIS vs PSORIASIS - R/O Tinea Possibly exacerbated by Irritant Contact Dermatitis r/t rubber shoes (crocs) Pt did not like using Mometasone  cream - only used for a few weeks. Feet, L arm; hands/palms Exam: scaling, peeling and fissure formation feet L > R hands with roughness, scaling L upper arm 10% BSA Chronic and persistent condition with duration or expected duration over one year. Condition is symptomatic/ bothersome to patient. Not currently at goal. Atopic dermatitis (eczema) is a chronic, relapsing, pruritic condition that can significantly affect quality of life. It is often associated with allergic rhinitis and/or asthma and can require treatment with topical medications, phototherapy, or in severe cases biologic injectable medication (Dupixent; Adbry) or Oral JAK inhibitors. Treatment Plan: Toenail and foot peeling skin molecular study sent today Nail-Fungal-ID Molecular Diagnostic test performed today.  Discussed with patient their insurance will be billed.  Advised the patient they may get a bill for a portion that's not covered by their insurance.  Should the patient have  any issues with their remaining responsibility they will not be sent to collections but [name] will work with them internally on any remaining balance.     [employer] patient information sheet given to patient.  Start Elidel  cr bid to aa feet and left arm until clear, then prn flares Continue moisturizer to hands.  Recommend gentle skin care.   SEBORRHEIC DERMATITIS Pt not using prescribed Ketoconazole  shampoo. Says doing fine on Nutrafol shampoo and conditioner scalp Exam:  Seborrheic Dermatitis is a chronic persistent rash characterized by pinkness and scaling most commonly of the mid face but also can occur on the scalp (dandruff), ears; mid chest, mid back and groin.  It tends to be exacerbated by stress and cooler weather.  People who have neurologic disease may experience new onset or exacerbation of existing seborrheic dermatitis.  The condition is not curable but treatable and can be controlled. Treatment Plan: Cont Nutrafol shampoo and Conditioner    RASH   SEBORRHEIC DERMATITIS   COUNSELING AND COORDINATION OF CARE   MEDICATION MANAGEMENT    Return in about 3 months (around 04/18/2024) for Atopic Derm vs Psoriais .  I, Grayce Saunas, RMA, am acting as scribe for Alm Rhyme, MD .   Documentation: I have reviewed the above documentation for accuracy and completeness, and I agree with the above.  Alm Rhyme, MD

## 2024-01-18 ENCOUNTER — Encounter: Payer: Self-pay | Admitting: Dermatology

## 2024-01-18 ENCOUNTER — Ambulatory Visit: Admitting: Obstetrics and Gynecology

## 2024-01-19 ENCOUNTER — Ambulatory Visit

## 2024-01-19 ENCOUNTER — Telehealth: Payer: Self-pay

## 2024-01-19 ENCOUNTER — Ambulatory Visit (INDEPENDENT_AMBULATORY_CARE_PROVIDER_SITE_OTHER)

## 2024-01-19 VITALS — Ht 62.0 in | Wt 125.0 lb

## 2024-01-19 DIAGNOSIS — Z0000 Encounter for general adult medical examination without abnormal findings: Secondary | ICD-10-CM

## 2024-01-19 DIAGNOSIS — Z79899 Other long term (current) drug therapy: Secondary | ICD-10-CM

## 2024-01-19 DIAGNOSIS — B351 Tinea unguium: Secondary | ICD-10-CM

## 2024-01-19 NOTE — Progress Notes (Signed)
 Because this visit was a virtual/telehealth visit,  certain criteria was not obtained, such a blood pressure, CBG if applicable, and timed get up and go. Any medications not marked as taking were not mentioned during the medication reconciliation part of the visit. Any vitals not documented were not able to be obtained due to this being a telehealth visit or patient was unable to self-report a recent blood pressure reading due to a lack of equipment at home via telehealth. Vitals that have been documented are verbally provided by the patient.   Subjective:   Kimberly Browning is a 48 y.o. who presents for a Medicare Wellness preventive visit.  As a reminder, Annual Wellness Visits don't include a physical exam, and some assessments may be limited, especially if this visit is performed virtually. We may recommend an in-person follow-up visit with your provider if needed.  Visit Complete: Virtual I connected with  Reena LITTIE Sharps on 01/19/24 by a audio enabled telemedicine application and verified that I am speaking with the correct person using two identifiers.  Patient Location: Home  Provider Location: Home Office  I discussed the limitations of evaluation and management by telemedicine. The patient expressed understanding and agreed to proceed.  Vital Signs: Because this visit was a virtual/telehealth visit, some criteria may be missing or patient reported. Any vitals not documented were not able to be obtained and vitals that have been documented are patient reported.  VideoDeclined- This patient declined Librarian, academic. Therefore the visit was completed with audio only.  Persons Participating in Visit: Patient.  AWV Questionnaire: No: Patient Medicare AWV questionnaire was not completed prior to this visit.  Cardiac Risk Factors include: sedentary lifestyle     Objective:    Today's Vitals   01/19/24 1613  Weight: 125 lb (56.7 kg)  Height: 5' 2  (1.575 m)  PainSc: 0-No pain   Body mass index is 22.86 kg/m.     01/19/2024    4:16 PM 09/08/2023    1:22 PM 09/05/2023   11:12 AM 07/29/2023   11:30 AM 04/05/2023    1:25 PM 11/11/2022   11:52 AM 11/05/2022    1:49 PM  Advanced Directives  Does Patient Have a Medical Advance Directive? No No No No No No No  Would patient like information on creating a medical advance directive? No - Patient declined No - Patient declined No - Patient declined No - Patient declined No - Patient declined      Current Medications (verified) Outpatient Encounter Medications as of 01/19/2024  Medication Sig   Estradiol (DIVIGEL) 0.5 MG/0.5GM GEL Apply to inner thighs daily   medroxyPROGESTERone (PROVERA) 5 MG tablet Take 1 tablet (5 mg total) by mouth daily. Use for twelve consecutive days every month while on estrogen therapy   Atogepant (QULIPTA) 60 MG TABS Take 1 tablet (60 mg total) by mouth daily. (Patient not taking: Reported on 10/17/2023)   citalopram (CELEXA) 20 MG tablet Take 1 tablet (20 mg total) by mouth daily. (Patient not taking: Reported on 10/17/2023)   hydrOXYzine (ATARAX) 50 MG tablet 75 mg. (Patient not taking: Reported on 10/17/2023)   ketoconazole (NIZORAL) 2 % shampoo Apply 1 Application topically 2 (two) times a week. Wash scalp 2 times weekly, let sit 5 minutes and rinse out (Patient not taking: Reported on 10/17/2023)   lamoTRIgine (LAMICTAL) 200 MG tablet Take 400 mg by mouth daily. (Patient not taking: Reported on 10/17/2023)   mometasone (ELOCON) 0.1 % cream Apply  1 Application topically as directed. qd to bid to eczema on hands and feet 5 days a week, Monday - Friday until clear, then as needed for flares (Patient not taking: Reported on 10/17/2023)   pimecrolimus  (ELIDEL ) 1 % cream Apply topically 2 (two) times daily. Bid to  eczema on feet and arms   temazepam (RESTORIL) 15 MG capsule Take 3 capsules by mouth daily. (Patient not taking: Reported on 10/17/2023)   No facility-administered  encounter medications on file as of 01/19/2024.    Allergies (verified) Geodon [ziprasidone hydrochloride], Hibiclens [chlorhexidine], Sulfamethoxazole-trimethoprim, Doxycycline, Gabapentin, Other, Penicillins, Promethazine , and Depakote [divalproex sodium]   History: Past Medical History:  Diagnosis Date   Anxiety    Bipolar disorder (HCC)    Depression    Dry skin    hands   Feeling of incomplete bladder emptying    GERD (gastroesophageal reflux disease)    Herpes simplex virus (HSV) infection 10/10/2007   Qualifier: Diagnosis of   By: Joyice MD, Harlene SCULL SNOMED Dx Update Oct 2024     History of molar pregnancy, antepartum 12/12/2008   History of tobacco use 06/09/2009   Qualifier: Diagnosis of   By: Manford Longs      History of smokeing 4-6 cigarettes/day since she was 48 years old.   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 Is motivated for weight loss. Not physically active.  Has trialed Metformin - d/c'd due to GI upset, Pheniramine - unable to tolerate, Saxenda  3mg  but was discontinued as it was not appropriate per last PCP. Has had success with Phentermine 37.5mg  but developed insomnia - opted to discontinued given unlikely long term improvement. Self discontinued Wellb   Sleep apnea    Symptomatic mammary hypertrophy 08/15/2020   Urge urinary incontinence    Vitamin D  deficiency    Wears contact lenses    Past Surgical History:  Procedure Laterality Date   BREAST REDUCTION SURGERY  05/10/2011   Procedure: MAMMARY REDUCTION BILATERAL (BREAST);  Surgeon: Elna LITTIE Pick;  Location: [facility];  Service: Plastics;  Laterality: Bilateral;  bilateral breast reduction   DILATION AND EVACUATION  12/26/2008   x 2   INTERSTIM IMPLANT PLACEMENT N/A 07/09/2014    Procedure: RENNA IMPLANT FIRST STAGE;  Surgeon: Glendia DELENA Elizabeth, MD;  Location: [facility];  Service: Urology;  Laterality: N/A;   INTERSTIM IMPLANT PLACEMENT N/A 07/09/2014   Procedure: RENNA IMPLANT SECOND STAGE;  Surgeon: Glendia DELENA Elizabeth, MD;  Location: [facility];  Service: Urology;  Laterality: N/A;   INTRAUTERINE DEVICE (IUD) INSERTION  sept 2011   mirena    WISDOM TOOTH EXTRACTION  as a teenager   Family History  Adopted: Yes  Problem Relation Age of Onset   Headache Neg Hx    Migraines Neg Hx    Social History   Socioeconomic History   Marital status: Divorced    Spouse name: Not on file   Number of children: 1   Years of education: Not on file   Highest education level: Not on file  Occupational History   Occupation: disablitiy  Tobacco Use   Smoking status: Former    Current packs/day: 0.00    Average packs/day: 0.3 packs/day for  19.0 years (4.8 ttl pk-yrs)    Types: Cigarettes    Start date: 09/22/2000    Quit date: 09/23/2019    Years since quitting: 4.3    Passive exposure: Past   Smokeless tobacco: Never   Tobacco comments:    Vapes  Vaping Use   Vaping status: Every Day   Substances: Nicotine, Flavoring  Substance and Sexual Activity   Alcohol use: Not Currently   Drug use: No   Sexual activity: Not Currently    Birth control/protection: None, Post-menopausal  Other Topics Concern   Not on file  Social History Narrative   Caffeine: cup coffee daily   Working: on disability    Education: 12 th grade   Live alone, one daughter.    Social Drivers of Corporate investment banker Strain: Low Risk  (01/19/2024)   Overall Financial Resource Strain (CARDIA)    Difficulty of Paying Living Expenses: Not hard at all  Food Insecurity: No Food Insecurity (01/19/2024)   Hunger Vital Sign    Worried About Running Out of Food in the Last Year: Never true    Ran Out of Food in the Last Year: Never true  Transportation  Needs: No Transportation Needs (01/19/2024)   PRAPARE - Administrator, Civil Service (Medical): No    Lack of Transportation (Non-Medical): No  Physical Activity: Inactive (01/19/2024)   Exercise Vital Sign    Days of Exercise per Week: 0 days    Minutes of Exercise per Session: 0 min  Stress: Stress Concern Present (01/19/2024)   [employer] of Occupational Health - Occupational Stress Questionnaire    Feeling of Stress: To some extent  Social Connections: Socially Isolated (01/19/2024)   Social Connection and Isolation Panel    Frequency of Communication with Friends and Family: More than three times a week    Frequency of Social Gatherings with Friends and Family: More than three times a week    Attends Religious Services: Never    Database administrator or Organizations: No    Attends Engineer, structural: Never    Marital Status: Never married    Tobacco Counseling Counseling given: Not Answered Tobacco comments: Vapes    Clinical Intake:  Pre-visit preparation completed: Yes  Pain : No/denies pain Pain Score: 0-No pain     BMI - recorded: 21.46 Nutritional Status: BMI of 19-24  Normal Nutritional Risks: None Diabetes: No  Lab Results  Component Value Date   HGBA1C 4.8 12/31/2022   HGBA1C 5.6 07/02/2020     How often do you need to have someone help you when you read instructions, pamphlets, or other written materials from your doctor or pharmacy?: 1 - Never  Interpreter Needed?: No  Information entered by :: Rahim Astorga N. Azavier Creson, LPN.   Activities of Daily Living     01/19/2024    4:17 PM  In your present state of health, do you have any difficulty performing the following activities:  Hearing? 0  Vision? 0  Difficulty concentrating or making decisions? 0  Walking or climbing stairs? 0  Dressing or bathing? 0  Doing errands, shopping? 0  Preparing Food and eating ? N  Using the Toilet? N  In the past six months, have you  accidently leaked urine? N  Do you have problems with loss of bowel control? N  Managing your Medications? N  Managing your Finances? N  Housekeeping or managing your Housekeeping? N    Patient Care Team: Janna,  Kathrine, DO as PCP - [facility], [facility] LABOR, CNM as Midwife (Obstetrics and Gynecology) [facility], [facility] as Consulting Physician (Ophthalmology)  I have updated your Care Teams any recent Medical Services you may have received from other providers in the past year.     Assessment:   This is a routine wellness examination for Amarilis.  Hearing/Vision screen Hearing Screening - Comments:: Denies hearing difficulties.  Vision Screening - Comments:: Wears rx glasses contact lenses - up to date with routine eye exams with [facility]    Goals Addressed             This Visit's Progress    Client understands the importance of follow-up with providers by attending scheduled visits         Depression Screen     01/19/2024    4:20 PM 09/16/2023    1:37 PM 09/08/2023    1:21 PM 09/05/2023   11:09 AM 08/18/2023    1:58 PM 08/11/2023    2:48 PM 07/29/2023   11:29 AM  PHQ 2/9 Scores  PHQ - 2 Score 1 1 1 1 6 4 6   PHQ- 9 Score 2 11 6 4 11 9 12     Fall Risk     01/19/2024    4:16 PM 11/23/2022   10:53 AM 10/08/2021    1:34 PM 09/21/2021    2:07 PM 08/07/2021   10:38 AM  Fall Risk   Falls in the past year? 1 0 0 0 0  Number falls in past yr: 1 0 0 0 0  Injury with Fall? 1 0 0 0 0  Risk for fall due to : History of fall(s) No Fall Risks     Follow up Falls evaluation completed;Education provided Falls evaluation completed       MEDICARE RISK AT HOME:  Medicare Risk at Home Any stairs in or around the home?: Yes (FRONT ENTRANCE) If so, are there any without handrails?: No Home free of loose throw rugs in walkways, pet beds, electrical cords, etc?: Yes Adequate lighting in your home to reduce risk of falls?: Yes Life alert?: No Use of a  cane, walker or w/c?: No Grab bars in the bathroom?: No Shower chair or bench in shower?: No Elevated toilet seat or a handicapped toilet?: No  TIMED UP AND GO:  Was the test performed?  No  Cognitive Function: Declined/Normal: No cognitive concerns noted by patient or family. Patient alert, oriented, able to answer questions appropriately and recall recent events. No signs of memory loss or confusion.    01/19/2024    4:20 PM  MMSE - Mini Mental State Exam  Not completed: Unable to complete        01/19/2024    4:35 PM 11/23/2022   10:58 AM  6CIT Screen  What Year? 0 points 0 points  What month? 0 points 0 points  What time? 0 points 0 points  Count back from 20 0 points 0 points  Months in reverse 0 points 0 points  Repeat phrase 0 points 2 points  Total Score 0 points 2 points    Immunizations Immunization History  Administered Date(s) Administered   Influenza Split 03/05/2010, 04/02/2011, 04/17/2012, 03/15/2013   Influenza Whole 04/06/2007, 03/26/2009   Influenza,inj,Quad PF,6+ Mos 03/06/2015, 05/14/2016, 04/19/2017   Influenza,inj,quad, With Preservative 03/12/2014   PFIZER(Purple Top)SARS-COV-2 Vaccination 09/06/2019, 10/01/2019, 05/23/2020   Pneumococcal Polysaccharide-23 03/06/2015   Td 06/07/1997   Tdap 04/02/2011    Screening Tests Health Maintenance  Topic Date Due   Hepatitis B Vaccines 19-59 Average Risk (1 of 3 - 19+ 3-dose series) Never done   Pneumococcal Vaccine (2 of 2 - PCV) 03/05/2016   DTaP/Tdap/Td (3 - Td or Tdap) 04/01/2021   COVID-19 Vaccine (4 - 2024-25 season) 02/06/2023   INFLUENZA VACCINE  01/06/2024   Medicare Annual Wellness (AWV)  01/18/2025   Cervical Cancer Screening (HPV/Pap Cotest)  10/09/2026   Colonoscopy  05/30/2031   Hepatitis C Screening  Completed   HIV Screening  Completed   HPV VACCINES  Aged Out   Meningococcal B Vaccine  Aged Out    Health Maintenance  Health Maintenance Due  Topic Date Due   Hepatitis B  Vaccines 19-59 Average Risk (1 of 3 - 19+ 3-dose series) Never done   Pneumococcal Vaccine (2 of 2 - PCV) 03/05/2016   DTaP/Tdap/Td (3 - Td or Tdap) 04/01/2021   COVID-19 Vaccine (4 - 2024-25 season) 02/06/2023   INFLUENZA VACCINE  01/06/2024   Health Maintenance Items Addressed: Yes Patient declined all vaccines.   Additional Screening:  Vision Screening: Recommended annual ophthalmology exams for early detection of glaucoma and other disorders of the eye. Would you like a referral to an eye doctor? No    Dental Screening: Recommended annual dental exams for proper oral hygiene  Community Resource Referral / Chronic Care Management: CRR required this visit?  No   CCM required this visit?  No   Plan:    I have personally reviewed and noted the following in the patient's chart:   Medical and social history Use of alcohol, tobacco or illicit drugs  Current medications and supplements including opioid prescriptions. Patient is not currently taking opioid prescriptions. Functional ability and status Nutritional status Physical activity Advanced directives List of other physicians Hospitalizations, surgeries, and ER visits in previous 12 months Vitals Screenings to include cognitive, depression, and falls Referrals and appointments  In addition, I have reviewed and discussed with patient certain preventive protocols, quality metrics, and best practice recommendations. A written personalized care plan for preventive services as well as general preventive health recommendations were provided to patient.   Roz LOISE Fuller, LPN   [id number]   After Visit Summary: (MyChart) Due to this being a telephonic visit, the after visit summary with patients personalized plan was offered to patient via MyChart   Notes: Nothing significant to report at this time.

## 2024-01-19 NOTE — Telephone Encounter (Signed)
 [employer] nail fungal ID results faxed and scanned into media. aw

## 2024-01-19 NOTE — Telephone Encounter (Signed)
Left message for patient to return call. aw 

## 2024-01-19 NOTE — Patient Instructions (Signed)
 Kimberly Browning , Thank you for taking time out of your busy schedule to complete your Annual Wellness Visit with me. I enjoyed our conversation and look forward to speaking with you again next year. I, as well as your care team,  appreciate your ongoing commitment to your health goals. Please review the following plan we discussed and let me know if I can assist you in the future. Your Game plan/ To Do List    Referrals: If you haven't heard from the office you've been referred to, please reach out to them at the phone provided.   Follow up Visits: We will see or speak with you next year for your Next Medicare AWV with our clinical staff Have you seen your provider in the last 6 months (3 months if uncontrolled diabetes)? No  Clinician Recommendations:  Aim for 30 minutes of exercise or brisk walking, 6-8 glasses of water, and 5 servings of fruits and vegetables each day.       This is a list of the screenings recommended for you:  Health Maintenance  Topic Date Due   Hepatitis B Vaccine (1 of 3 - 19+ 3-dose series) Never done   Pneumococcal Vaccine (2 of 2 - PCV) 03/05/2016   DTaP/Tdap/Td vaccine (3 - Td or Tdap) 04/01/2021   COVID-19 Vaccine (4 - 2024-25 season) 02/06/2023   Flu Shot  01/06/2024   Medicare Annual Wellness Visit  01/18/2025   Pap with HPV screening  10/09/2026   Colon Cancer Screening  05/30/2031   Hepatitis C Screening  Completed   HIV Screening  Completed   HPV Vaccine  Aged Out   Meningitis B Vaccine  Aged Out    Advanced directives: (Declined) Advance directive discussed with you today. Even though you declined this today, please call our office should you change your mind, and we can give you the proper paperwork for you to fill out. Advance Care Planning is important because it:  [x]  Makes sure you receive the medical care that is consistent with your values, goals, and preferences  [x]  It provides guidance to your family and loved ones and reduces their  decisional burden about whether or not they are making the right decisions based on your wishes.  Follow the link provided in your after visit summary or read over the paperwork we have mailed to you to help you started getting your Advance Directives in place. If you need assistance in completing these, please reach out to us  so that we can help you!  See attachments for Preventive Care and Fall Prevention Tips.

## 2024-01-23 NOTE — Addendum Note (Signed)
 Addended by: TERESA PALMA R on: 01/23/2024 10:56 AM   Modules accepted: Orders

## 2024-01-23 NOTE — Telephone Encounter (Signed)
 Patient advised of information. Spent over 10 minutes on the phone explaining everything to patient. Patient would like information in MyChart along with the three funguses information.   Patient will go to [facility] for blood draw. aw

## 2024-01-24 ENCOUNTER — Ambulatory Visit: Admitting: Family Medicine

## 2024-01-31 ENCOUNTER — Ambulatory Visit: Admitting: Family Medicine

## 2024-02-09 ENCOUNTER — Telehealth: Payer: Self-pay

## 2024-02-09 NOTE — Telephone Encounter (Signed)
 Patient LVM on nurse line requesting referral for a hole inside my nose.   Returned call to patient. Patient reports that this has been present for several years and would like referral to specialist regarding closure options.   Advised patient that she would need an appointment for appropriate referral to be placed.  Patient has a visit tomorrow with Dr. Howell in which she would like this to be addressed.   Advised patient that due to time limitations, provider may not be able to evaluate her for all concerns and she may need to schedule PCP follow up.   Patient voices understanding.   Chiquita JAYSON English, RN

## 2024-02-10 ENCOUNTER — Encounter: Payer: Self-pay | Admitting: Student

## 2024-02-10 ENCOUNTER — Ambulatory Visit (INDEPENDENT_AMBULATORY_CARE_PROVIDER_SITE_OTHER): Admitting: Student

## 2024-02-10 VITALS — BP 108/75 | HR 65 | Ht 62.0 in | Wt 125.4 lb

## 2024-02-10 DIAGNOSIS — J3489 Other specified disorders of nose and nasal sinuses: Secondary | ICD-10-CM | POA: Diagnosis not present

## 2024-02-10 DIAGNOSIS — L710 Perioral dermatitis: Secondary | ICD-10-CM

## 2024-02-10 MED ORDER — METRONIDAZOLE 0.75 % EX CREA: TOPICAL_CREAM | Freq: Two times a day (BID) | CUTANEOUS | 0 refills | Status: DC

## 2024-02-10 MED ORDER — METRONIDAZOLE 0.75 % EX CREA: TOPICAL_CREAM | Freq: Two times a day (BID) | CUTANEOUS | 0 refills | Status: AC

## 2024-02-10 NOTE — Progress Notes (Signed)
    SUBJECTIVE:   CHIEF COMPLAINT / HPI:   Periorbital rash Patient presents with periorbital rash.  She does not know when it began.  It is not painful, pruritic, or spreading.  Denies excessive drooling.  Denies any new exposures.  No systemic allergic symptoms.  She has been using OTC cleaning clear (benzyl peroxide/salicylic acid) on her face-discussed this may be in part contributing to dry skin.  Denies any corticosteroid use on her face.  Nasal septum perforation Patient reports concern for perforation of her septum.  Denies piercings, trauma, and all illicit substance use.  Is not painful, there is no bleeding, she just feels like her nose gets dry and makes her stiff more often.  OBJECTIVE:   BP 108/75   Pulse 65   Ht 5' 2 (1.575 m)   Wt 125 lb 6.4 oz (56.9 kg)   LMP 07/31/2020   SpO2 100%   BMI 22.94 kg/m    General: NAD, pleasant HEENT: Large septal perforation of nose, no bleeding, no drainage.  No lesions in mouth. Skin: Scattered periorbital papules with erythematous base, nontender, no drainage, blanchable.  See picture below.   ASSESSMENT/PLAN:   Assessment & Plan Perioral dermatitis Suspect early dermatitis.  Rash is not consistent with herpetic infection, impetigo, cheilitis. -Trial metronidazole  cream, twice daily for 4 weeks - Avoid other topical agents Nasal septal perforation Large septal perforation, patient interested in seeing ENT. - Referral to ENT  Gladis Church, DO  [facility]

## 2024-02-10 NOTE — Patient Instructions (Addendum)
 It was great to see you! Thank you for allowing me to participate in your care!   I recommend that you always bring your medications to each appointment as this makes it easy to ensure we are on the correct medications and helps us  not miss when refills are needed.  Our plans for today:  - Please use metronidazole  cream twice daily for 4 weeks to improve rash around mouth.  Do not put any corticosteroid cream, nor clean and clear around mouth to allow rash to heal. - I have sent a referral to the ENT doctors to discuss nasal septum perforation   Take care and seek immediate care sooner if you develop any concerns. Please remember to show up 15 minutes before your scheduled appointment time!  Gladis Church, DO [facility]

## 2024-02-14 ENCOUNTER — Ambulatory Visit: Admitting: Family Medicine

## 2024-02-14 VITALS — BP 110/74 | HR 70 | Ht 62.0 in | Wt 126.0 lb

## 2024-02-14 DIAGNOSIS — Z131 Encounter for screening for diabetes mellitus: Secondary | ICD-10-CM | POA: Diagnosis present

## 2024-02-14 LAB — POCT GLYCOSYLATED HEMOGLOBIN (HGB A1C): Hemoglobin A1C: 5.3 % (ref 4.0–5.6)

## 2024-02-14 NOTE — Patient Instructions (Signed)
 It was great to see you today! Thank you for choosing [facility] for your primary care. Kimberly Browning was seen for diabetes screening.  Today we addressed: Screening for Diabetes - your A1c is 5.3, this is good you are not diabetic! Please continue your dietary and physical activities as tolerated.   You should return to our clinic No follow-ups on file. Please arrive 15 minutes before your appointment to ensure smooth check in process.  We appreciate your efforts in making this happen.  Thank you for allowing me to participate in your care, Kathrine Melena, DO 02/14/2024, 3:15 PM PGY-2, [facility]

## 2024-02-14 NOTE — Progress Notes (Signed)
    SUBJECTIVE:   CHIEF COMPLAINT / HPI:   Screening for DM - Denies any polyuria and polydipsia - Has some increased body tingling and wakes up shivering, but denies feeling cold  PERTINENT  PMH / PSH: Bipolar Disorder  OBJECTIVE:   BP 110/74   Pulse 70   Ht 5' 2 (1.575 m)   Wt 126 lb (57.2 kg)   LMP 07/31/2020   SpO2 99%   BMI 23.05 kg/m   General: Awake and Alert in NAD HEENT: NCAT. Sclera anicteric. No rhinorrhea. Cardiovascular: RRR. No M/R/G Respiratory: CTAB, normal WOB on RA. No wheezing, crackles, rhonchi, or diminished breath sounds. Abdomen: Soft, non-tender, non-distended. Bowel sounds normoactive Extremities: Able to move all extremities. No BLE edema, no deformities or significant joint findings. Skin: Warm and dry. No abrasions or rashes noted. Neuro: A&Ox3. No focal neurological deficits.   ASSESSMENT/PLAN:   Assessment & Plan Screening for diabetes mellitus POCT A1c 5.3 today, no DM.  Advised continuing dietary and lifestyle modifications that she is.  Discussed that sometimes there is no medical reason for some of her symptoms, but also menopause presents in a multitude of ways for people.  Patient understood.   Kimberly Melena, DO [facility]

## 2024-02-21 ENCOUNTER — Ambulatory Visit (INDEPENDENT_AMBULATORY_CARE_PROVIDER_SITE_OTHER): Admitting: Podiatry

## 2024-02-21 ENCOUNTER — Encounter (INDEPENDENT_AMBULATORY_CARE_PROVIDER_SITE_OTHER): Payer: Self-pay

## 2024-02-21 VITALS — Ht 62.0 in | Wt 126.0 lb

## 2024-02-21 DIAGNOSIS — B351 Tinea unguium: Secondary | ICD-10-CM

## 2024-02-21 MED ORDER — NEOMYCIN-POLYMYXIN-HC 3.5-10000-1 OT SUSP: OTIC | 0 refills | Status: DC

## 2024-02-21 NOTE — Patient Instructions (Signed)

## 2024-02-22 NOTE — Progress Notes (Signed)
  Subjective:  Patient ID: Kimberly Browning, female    DOB: 1975-11-07,  MRN: [id number]  Chief Complaint  Patient presents with   Ingrown Toenail    Rm 3 Pt is here for removal of left hallux nail. Nail was previously removed, new partial growth.    48 y.o. female presents with the above complaint. History confirmed with patient.  The nail has previously been removed and when it grew back and did not grow back correctly only about half the nail grew back  Objective:  Physical Exam: warm, good capillary refill, no trophic changes or ulcerative lesions, normal DP and PT pulses, normal sensory exam, and dystrophic left hallux nail.  Assessment:   1. Onychomycosis      Plan:  Patient was evaluated and treated and all questions answered.    Ingrown Nail, left -Patient elects to proceed with minor surgery to remove ingrown toenail today. Consent reviewed and signed by patient. -Ingrown nail excised. See procedure note. -Educated on post-procedure care including soaking. Written instructions provided and reviewed. -Rx for Cortisporin sent to pharmacy. -Advised on signs and symptoms of infection developing.  We discussed that the phenol likely will create some redness and edema and tenderness around the nailbed as long as it is localized this is to be expected.  Will return as needed if any infection signs develop  Procedure: Excision of Ingrown Toenail Location: Left 1st toe  nail  Anesthesia: Lidocaine  1% plain; 1.5 mL and Marcaine  0.5% plain; 1.5 mL, digital block. Skin Prep: Betadine. Dressing: Silvadene; telfa; dry, sterile, compression dressing. Technique: Following skin prep, the toe was exsanguinated and a tourniquet was secured at the base of the toe. The affected nail was freed, and excised. Chemical matrixectomy was then performed with phenol and irrigated out with alcohol. The tourniquet was then removed and sterile dressing applied. Disposition: Patient tolerated procedure  well.    Return if symptoms worsen or fail to improve.

## 2024-02-24 ENCOUNTER — Ambulatory Visit (INDEPENDENT_AMBULATORY_CARE_PROVIDER_SITE_OTHER): Admitting: Student

## 2024-02-24 VITALS — BP 102/69 | HR 88 | Wt 125.6 lb

## 2024-02-24 DIAGNOSIS — R300 Dysuria: Secondary | ICD-10-CM | POA: Diagnosis present

## 2024-02-24 LAB — POCT URINALYSIS DIP (MANUAL ENTRY)
Bilirubin, UA: NEGATIVE
Blood, UA: NEGATIVE
Glucose, UA: NEGATIVE mg/dL
Ketones, POC UA: NEGATIVE mg/dL
Nitrite, UA: NEGATIVE
Protein Ur, POC: NEGATIVE mg/dL
Spec Grav, UA: 1.015 (ref 1.010–1.025)
Urobilinogen, UA: 0.2 U/dL
pH, UA: 7 (ref 5.0–8.0)

## 2024-02-24 LAB — POCT UA - MICROSCOPIC ONLY: RBC, Urine, Miroscopic: NONE SEEN (ref 0–2)

## 2024-02-24 MED ORDER — NITROFURANTOIN MONOHYD MACRO 100 MG PO CAPS: 100.0000 mg | ORAL_CAPSULE | Freq: Two times a day (BID) | ORAL | 0 refills | Status: AC

## 2024-02-24 NOTE — Patient Instructions (Addendum)
 Pleasure see you today,  Your urine test wasn't confirmatory for UTI but showed white blood cells in your urine. I have sent in prescription for antibiotics    Also you can use over the counter Boric acid suppository to help with any inflammation is there.  If you have any fevers or chills please return so that you can be reevaluated.

## 2024-02-24 NOTE — Progress Notes (Cosign Needed Addendum)
    SUBJECTIVE:   CHIEF COMPLAINT / HPI:   48 year old female presenting today for concerns of increased urinary frequency, urgency and dysuria.  She states symptoms have been going on for at least within a week to a month but unable to tell me the onset.  Denies any vaginal itchiness, discharge or odor.  Hasn't been sexually active in a long time (Over 6 months).  PERTINENT  PMH / PSH: Reviewed   OBJECTIVE:   BP 102/69 (BP Location: Left Arm, Patient Position: Sitting, Cuff Size: Normal)   Pulse 88   Wt 125 lb 9.6 oz (57 kg)   LMP 07/31/2020   SpO2 95%   BMI 22.97 kg/m    Physical Exam General: Alert, well appearing, NAD Cardiovascular: RRR, No Murmurs, Normal S2/S2 Respiratory: CTAB, No wheezing or Rales Abdomen: No distension or tenderness Pelvic exam: Deferred at patient's request.   ASSESSMENT/PLAN:   UTI Patient complaining of dysuria and increased urinary frequency .  POCT dipstick was not confirmatory for UTI but shows trace leukocytes.  Urine culture ordered but will treat empirically with given patient is symptomatic. -Rx Macrobid  100mg  BID for 5 days - Ordered UA and urine culture - Recommend use of boric acid suppository - Return precaution discussed with patient.  Norleen April, MD [facility]

## 2024-02-26 LAB — URINE CULTURE

## 2024-02-27 ENCOUNTER — Ambulatory Visit: Payer: Self-pay | Admitting: Student

## 2024-02-28 ENCOUNTER — Telehealth: Payer: Self-pay

## 2024-02-28 ENCOUNTER — Telehealth: Payer: Self-pay | Admitting: Podiatry

## 2024-02-28 NOTE — Telephone Encounter (Signed)
 Left message to patient relayed information provided by provider.

## 2024-02-28 NOTE — Telephone Encounter (Signed)
Thank you for the updates.  

## 2024-02-28 NOTE — Telephone Encounter (Signed)
 Patient calls nurse line reporting continued urinary frequency.  She reports she is on day 3 of the antibiotics.   She denies any dysuria, fevers, hematuria, back pain or abdominal pain.   Patient advised to schedule a followup apt.   She reports she will call if her symptoms do not improve.

## 2024-02-28 NOTE — Telephone Encounter (Signed)
 Patient asked why medication was prescribed for the ear but not for the toe. She reports still having red drainage on the band aid and have gone through a lot of band aides. She wants clarification on the following:  Is the drainage due to the current medication?  How long is it expected for the drainage to continue?  Can a different medication be prescribed to help the toe heal faster?

## 2024-02-29 ENCOUNTER — Ambulatory Visit: Payer: Medicare Other | Admitting: Family Medicine

## 2024-03-01 ENCOUNTER — Ambulatory Visit (INDEPENDENT_AMBULATORY_CARE_PROVIDER_SITE_OTHER)

## 2024-03-01 VITALS — BP 111/73 | HR 55 | Temp 98.2°F | Wt 125.6 lb

## 2024-03-01 DIAGNOSIS — R300 Dysuria: Secondary | ICD-10-CM

## 2024-03-01 DIAGNOSIS — R350 Frequency of micturition: Secondary | ICD-10-CM

## 2024-03-01 LAB — POCT URINALYSIS DIP (MANUAL ENTRY)
Bilirubin, UA: NEGATIVE
Blood, UA: NEGATIVE
Glucose, UA: NEGATIVE mg/dL
Ketones, POC UA: NEGATIVE mg/dL
Leukocytes, UA: NEGATIVE
Nitrite, UA: NEGATIVE
Protein Ur, POC: NEGATIVE mg/dL
Spec Grav, UA: 1.01 (ref 1.010–1.025)
Urobilinogen, UA: 0.2 U/dL
pH, UA: 7 (ref 5.0–8.0)

## 2024-03-01 NOTE — Telephone Encounter (Signed)
 Patient returns call to nurse line.  She reports she is still having the increased urge to urinate.   She reports she has completed the course of antibiotics.   She denies any hematuria, fevers, dysuria, back or abdominal pain.   Patient scheduled for this afternoon for evaluation.

## 2024-03-01 NOTE — Progress Notes (Signed)
    SUBJECTIVE:   CHIEF COMPLAINT / HPI:   Patient presents with multiple weeks of significant urinary frequency and some burning with urination.  She reports 20 urinations in the last 2 hours, a volume roughly equivalent to a urinary specimen cup.  She took 5 days of antibiotics prescribed by Dr. Rosendo without any change in her symptoms.  She denies hematuria, fever, systemic symptoms.  She was last sexually active 5 years ago, last menstrual period was many years ago as she is currently in menopause.  She denies vaginal discharge or genital lesions.  She reports 1 prior episode of urinary frequency in her life when she was overweight, but she feels that that felt like a different type of syndrome.  That episode felt more like a mechanical problem involving a sensation of pressing on her bladder, whereas the sensation feels like a constant urge to pee and some irritation of her bladder as it feels.   OBJECTIVE:   BP 111/73   Pulse (!) 55   Temp 98.2 F (36.8 C)   Wt 125 lb 9.6 oz (57 kg)   LMP 07/31/2020   SpO2 98%   BMI 22.97 kg/m   General: Awake, alert, NAD. Communicates clearly, rapid speaking pattern. Cardio: RRR. Normal S1, S2. No murmur, rub, gallop. 2+ radial and dorsalis pedis pulses b/l w/ good capillary refill. No LE edema.  Resp: CTA bilaterally. No wheezes, rales, or rhonchi. Normal work of breathing on room air.  Abdomen: soft, non-tender, non-distended. Normoactive BS auscultated. No guarding, rigidity, or rebound. Negative Murphy's. No tenderness at McBurney's point. Negative CVA tenderness.  GU: Unremarkable external genitalia. No lesions, skin changes or discharge.  Skin: No rash or lesions appreciated. No abnormal nevi.  Psych: Appropriate mood and affect. No SI/HI.    ASSESSMENT/PLAN:   Assessment & Plan Frequency of urination Dysuria Differential includes interstitial cystitis, diabetes insipidus and psychogenic polydipsia Bladder diary Water intake  diary - UA with no evidence of infection, glycosuria or hematuria in the clinic today.  Specific gravity 1.010. BMP today Follow-up in 2 weeks with diary information   Patient verbalized understanding and agrees w/ plan.   Leafy Scriver, DO [facility]

## 2024-03-01 NOTE — Patient Instructions (Addendum)
 It was great to see you today!  Today we talked about your urination.  We are going to do some lab tests to evaluate whether your urination is affecting your kidneys or your electrolytes.  I would like you to keep a journal of how often you urinate, and how much water you are drinking.  At this time I do not think that there is any concern for infection or other concerning medical cause for how often you are peeing, but it is abnormal to be peeing this often so we are going to follow this closely.   Please follow up in 2 weeks.   Thank you for choosing [facility].   Please call [phone] with any questions about today's appointment.  Leafy Scriver, DO Family Medicine

## 2024-03-02 ENCOUNTER — Ambulatory Visit: Payer: Self-pay

## 2024-03-02 LAB — BASIC METABOLIC PANEL WITH GFR
BUN/Creatinine Ratio: 11 (ref 9–23)
BUN: 11 mg/dL (ref 6–24)
CO2: 23 mmol/L (ref 20–29)
Calcium: 9.9 mg/dL (ref 8.7–10.2)
Chloride: 103 mmol/L (ref 96–106)
Creatinine, Ser: 0.98 mg/dL (ref 0.57–1.00)
Glucose: 75 mg/dL (ref 70–99)
Potassium: 4.1 mmol/L (ref 3.5–5.2)
Sodium: 143 mmol/L (ref 134–144)
eGFR: 72 mL/min/1.73 (ref >59)

## 2024-03-05 ENCOUNTER — Institutional Professional Consult (permissible substitution) (INDEPENDENT_AMBULATORY_CARE_PROVIDER_SITE_OTHER)

## 2024-03-07 ENCOUNTER — Ambulatory Visit: Admitting: Obstetrics and Gynecology

## 2024-03-13 ENCOUNTER — Ambulatory Visit: Payer: Self-pay | Admitting: Dermatology

## 2024-03-13 DIAGNOSIS — B351 Tinea unguium: Secondary | ICD-10-CM

## 2024-03-13 LAB — HEPATIC FUNCTION PANEL
ALT: 12 IU/L (ref 0–32)
AST: 18 IU/L (ref 0–40)
Albumin: 4.5 g/dL (ref 3.9–4.9)
Alkaline Phosphatase: 111 IU/L (ref 41–116)
Bilirubin Total: 0.4 mg/dL (ref 0.0–1.2)
Bilirubin, Direct: 0.13 mg/dL (ref 0.00–0.40)
Total Protein: 6.4 g/dL (ref 6.0–8.5)

## 2024-03-13 MED ORDER — CICLOPIROX 8 % EX SOLN: Freq: Every day | CUTANEOUS | 11 refills | Status: AC

## 2024-03-13 MED ORDER — KETOCONAZOLE 2 % EX CREA: TOPICAL_CREAM | CUTANEOUS | 11 refills | Status: DC

## 2024-03-13 NOTE — Telephone Encounter (Addendum)
 Called and discussed lab and molecular study results with patient. Patient does not want to start Terbinafine  oral due to side effects and would like to know if there are other treatment options. She complains that her feet and ankles are hurting. Also states that Elidil cream she tried using twice daily to affected areas and does not help.  Consulted with Dr. Hester concerning this. He advised to patient can start on topical nail laquer like Kerydin, Jublia if covered, if not covered Penlac. Also recommends Ketoconazole  2 % cream - apply to entire foot (both feet) nails, and in between toes nightly for fungus.   Tried calling patient regarding his response. No answer. Lm for patient to return call  Kerydin and Jubia were not covered by insurance. Sent rx of Penlac to patient's pharmacy as well as Ketoconazole  2 % cream.  Will advise patient if she is not interested in topical treatments she does not have to pick them up.  Will also advise patient for the pain in feet and ankles Dr. Hester recommends follow up with Primary Care Doctor to further treat and evaluate.     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 Pt should contact us  through MyChart once done with 1 month med and advise if tolerated OK or any problem.  If no problem may consider 2 more months treatment for total 3 mos treatment. ----- Message ----- From: Interface, [facility] Lab Results In Sent: 03/13/2024   7:36 AM EDT To: Alm JAYSON Hester, MD

## 2024-03-14 NOTE — Telephone Encounter (Deleted)
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 Pt should contact us  through MyChart once done with 1 month med and advise if tolerated OK or any problem.  If no problem may consider 2 more months treatment for total 3 mos treatment. ----- Message ----- From: Interface, [facility] Lab Results In Sent: 03/13/2024   7:36 AM EDT To: Alm JAYSON Rhyme, MD

## 2024-03-15 ENCOUNTER — Telehealth: Payer: Self-pay | Admitting: Podiatry

## 2024-03-15 ENCOUNTER — Ambulatory Visit: Payer: Self-pay

## 2024-03-15 VITALS — BP 111/73 | HR 65 | Ht 62.0 in | Wt 127.1 lb

## 2024-03-15 DIAGNOSIS — R350 Frequency of micturition: Secondary | ICD-10-CM

## 2024-03-15 LAB — POCT URINALYSIS DIP (MANUAL ENTRY)
Bilirubin, UA: NEGATIVE
Blood, UA: NEGATIVE
Glucose, UA: NEGATIVE mg/dL
Ketones, POC UA: NEGATIVE mg/dL
Leukocytes, UA: NEGATIVE
Nitrite, UA: NEGATIVE
Protein Ur, POC: NEGATIVE mg/dL
Spec Grav, UA: 1.015 (ref 1.010–1.025)
Urobilinogen, UA: 0.2 U/dL
pH, UA: 7.5 (ref 5.0–8.0)

## 2024-03-15 NOTE — Progress Notes (Deleted)
    SUBJECTIVE:   CHIEF COMPLAINT / HPI:   ***  PERTINENT  PMH / PSH: ***  OBJECTIVE:   BP 111/73   Pulse 65   Ht 5' 2 (1.575 m)   Wt 127 lb 2 oz (57.7 kg)   LMP 07/31/2020   SpO2 99%   BMI 23.25 kg/m   ***  ASSESSMENT/PLAN:   Assessment & Plan      Milda LITTIE Deed, MD [facility]

## 2024-03-15 NOTE — Telephone Encounter (Signed)
 error

## 2024-03-15 NOTE — Progress Notes (Signed)
    SUBJECTIVE:   CHIEF COMPLAINT / HPI:   Patient reports continued frequent urination, has provided a log that demonstrates urination every 10 to 15 minutes throughout daytime hours.  Symptoms have not improved at all over the last 2 weeks since her last visit.  She drinks a maximum of 1 L of water per day plus some milk in her cereal but otherwise denies any excessive fluid intake.  She reports continued feelings of pressure, irritation in her bladder that are relieved temporarily by urination.  She endorses occasional burning but denies hematuria.  She denies any episodes of incontinence and reports that she sleeps through the night with the help of medications that she takes before bed including Lamictal, hydroxyzine, temazepam.  She reports she can take previous trips to [facility] without pain on herself but it is a rush to get home in order to pain.  She denies feeling any prolapse in her private region.  She has had 1 vaginal delivery at age 48.  She reports she had a history of incontinence earlier in her life but she has since lost a significant weight which resolved this problem for her.  She endorses vaping nicotine but denies smoking cigarettes or other drug use.   OBJECTIVE:   BP 111/73   Pulse 65   Ht 5' 2 (1.575 m)   Wt 127 lb 2 oz (57.7 kg)   LMP 07/31/2020   SpO2 99%   BMI 23.25 kg/m   General: Awake, alert, NAD. Communicates clearly. Abdomen: soft, non-tender, non-distended. Reports some discomfort w/ pressure over suprapubic region. Normoactive BS auscultated. No guarding, rigidity, or rebound. Negative Murphy's. No tenderness at McBurney's point. Negative CVA tenderness.  GU: Normal appearing external anatomy w/o lesion, skin change, or prolapse from the vaginal introitus.  Skin: No rash or lesions appreciated. No abnormal nevi.  Psych: Appropriate mood and affect. No SI/HI.     ASSESSMENT/PLAN:   Assessment & Plan Frequent urination Patient continues to have  frequent small-volume urinations. Multiple UA WNL and CMP reassuring at previous visit. Low concern for psychogenic polydipsia. Discussed fluid restriction and basic pelvic floor strengthening exercises. Potential for DDI's between anticholinergic medications and her current regimen Referred to urology for further management   Patient verbalized understanding and agrees w/ plan.   Leafy Scriver, DO [facility]

## 2024-03-21 ENCOUNTER — Ambulatory Visit (INDEPENDENT_AMBULATORY_CARE_PROVIDER_SITE_OTHER): Admitting: Urology

## 2024-03-21 VITALS — BP 121/75 | HR 69 | Ht 64.0 in | Wt 125.0 lb

## 2024-03-21 DIAGNOSIS — N3281 Overactive bladder: Secondary | ICD-10-CM | POA: Diagnosis not present

## 2024-03-21 DIAGNOSIS — R350 Frequency of micturition: Secondary | ICD-10-CM | POA: Diagnosis not present

## 2024-03-21 LAB — MICROSCOPIC EXAMINATION

## 2024-03-21 LAB — URINALYSIS, COMPLETE
Bilirubin, UA: NEGATIVE
Glucose, UA: NEGATIVE
Ketones, UA: NEGATIVE
Nitrite, UA: NEGATIVE
Protein,UA: NEGATIVE
RBC, UA: NEGATIVE
Specific Gravity, UA: 1.01 (ref 1.005–1.030)
Urobilinogen, Ur: 0.2 mg/dL (ref 0.2–1.0)
pH, UA: 6 (ref 5.0–7.5)

## 2024-03-21 MED ORDER — OXYBUTYNIN CHLORIDE ER 10 MG PO TB24: 10.0000 mg | ORAL_TABLET | Freq: Every day | ORAL | 11 refills | Status: AC

## 2024-03-21 NOTE — Patient Instructions (Signed)

## 2024-03-21 NOTE — Progress Notes (Signed)
 03/21/24 1:11 PM   Kimberly Browning 1975-11-25 [id number]  CC: Overactive bladder  HPI: 48 year old female with bipolar disorder referred for 3 months of urinary frequency and urgency.  She was previously followed by Dr. Gaston, and had InterStim placed in 2016.  She thinks this was initially helpful, but reportedly stopped working after she had a tooth surgery and was ultimately removed in 2017.  She thinks her bladder has overall done well since then until the last 3 months.  Her urinary symptoms are primarily frequent urination every 15 minutes during the day.  She has no problems with urination overnight.  She denies any stress incontinence.  She avoids public restrooms.  She drinks primarily water during the day.  She denies any dysuria or gross hematuria.  Urinalysis today is completely benign.   PMH: Past Medical History:  Diagnosis Date   Anxiety    Bipolar disorder (HCC)    Depression    Dry skin    hands   Feeling of incomplete bladder emptying    GERD (gastroesophageal reflux disease)    Herpes simplex virus (HSV) infection 10/10/2007   Qualifier: Diagnosis of   By: Joyice MD, Harlene SCULL SNOMED Dx Update Oct 2024     History of molar pregnancy, antepartum 12/12/2008   History of tobacco use 06/09/2009   Qualifier: Diagnosis of   By: Manford Longs      History of smokeing 4-6 cigarettes/day since she was 49 years old.   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 Is motivated for weight loss. Not physically active.  Has trialed Metformin - d/c'd due to GI upset, Pheniramine - unable to tolerate, Saxenda  3mg  but was discontinued as it was not appropriate per last PCP. Has had success with Phentermine 37.5mg  but developed insomnia - opted to discontinued given unlikely long  term improvement. Self discontinued Wellb   Sleep apnea    Symptomatic mammary hypertrophy 08/15/2020   Urge urinary incontinence    Vitamin D  deficiency    Wears contact lenses      Family History: Family History  Adopted: Yes  Problem Relation Age of Onset   Headache Neg Hx    Migraines Neg Hx     Social History:  reports that she quit smoking about 4 years ago. Her smoking use included cigarettes. She started smoking about 23 years ago. She has a 4.8 pack-year smoking history. She has been exposed to tobacco smoke. She has never used smokeless tobacco. She reports that she does not currently use alcohol. She reports that she does not use drugs.  Physical Exam: BP 121/75   Pulse 69   Ht 5' 4 (1.626 m)   Wt 125 lb (56.7 kg)   LMP 07/31/2020   BMI 21.46 kg/m    Constitutional: Pressured speech Cardiovascular: No clubbing, cyanosis, or edema. Respiratory: Normal respiratory effort, no increased work of breathing. GI: Abdomen is soft, nontender, nondistended, no abdominal masses   Laboratory Data: Reviewed, see HPI  Assessment & Plan:   48 year old female with bipolar disorder, previously followed by Dr. Gaston and had InterStim in 2016 that was ultimately removed in 2017.  She reports 3 months of worsening overactive symptoms with urgency and frequency during the  day, benign urinalysis.  We discussed that overactive bladder (OAB) is not a disease, but is a symptom complex that is generally not life-threatening.  Symptoms typically include urinary urgency, frequency, and urge incontinence.  There are numerous treatment options, however there are risks and benefits with both medical and surgical management.  First-line treatment is behavioral therapies including bladder training, pelvic floor muscle training, and fluid management.  Second line treatments include oral antimuscarinics(Ditropan er, Trospium) and beta-3 agonist (Mybetriq). There is typically a period of  medication trial (4-8 weeks) to find the optimal therapy and dosing. If symptoms are bothersome despite the above management, third line options include intra-detrusor botox, peripheral tibial nerve stimulation (PTNS), and interstim (SNS). These are more invasive treatments with higher side effect profile, but may improve quality of life for patients with severe OAB symptoms.   Mental illness likely contributing to her urinary symptoms, need to have realistic expectations Trial of oxybutynin 10 mg XL daily Follow-up with Dr. Gaston symptom check and discuss alternatives   Redell Burnet, MD 03/21/2024  [facility] [address] [place], [place] [address] [phone]

## 2024-03-22 ENCOUNTER — Ambulatory Visit: Admitting: Podiatry

## 2024-03-23 ENCOUNTER — Encounter (INDEPENDENT_AMBULATORY_CARE_PROVIDER_SITE_OTHER): Payer: Self-pay

## 2024-03-23 ENCOUNTER — Ambulatory Visit (INDEPENDENT_AMBULATORY_CARE_PROVIDER_SITE_OTHER)

## 2024-03-23 VITALS — BP 122/74 | HR 75 | Ht 64.0 in | Wt 125.0 lb

## 2024-03-23 DIAGNOSIS — J3489 Other specified disorders of nose and nasal sinuses: Secondary | ICD-10-CM

## 2024-03-23 NOTE — Progress Notes (Unsigned)
 Dear Dr. Donzetta, Here is my assessment for our mutual patient, Kimberly Browning. Thank you for allowing me the opportunity to care for your patient. Please do not hesitate to contact me should you have any other questions. Sincerely, Dr. Hadassah Parody  Otolaryngology Clinic Note Referring provider: Dr. Donzetta HPI:   Initial HPI (03/23/2024) Discussed the use of AI scribe software for clinical note transcription with the patient, who gave verbal consent to proceed.  History of Present Illness A 48 year old female who presents for evaluation of nasal septal perforation  Present for many years Symptoms include nasal obstruction, whistling. Symptoms worsen with temperature changes.  Symptoms becoming more bothersome  - No history of nasal surgery or nasal trauma  - Denies any past or current cocaine use or other illicit drug use in the nose  - No spontaneous epistaxis - No fullness in ears or perceived hearing loss  No personal history of autoimmune disorders   She is interested in surgical correction    Independent Review of Additional Tests or Records:  Referral note Kimberly Donzetta, MD (02/10/24): nasal septal perforation - nose gets dry, denies substance use   PMH/Meds/All/SocHx/FamHx/ROS:   Past Medical History:  Diagnosis Date   Anxiety    Bipolar disorder (HCC)    Depression    Dry skin    hands   Feeling of incomplete bladder emptying    GERD (gastroesophageal reflux disease)    Herpes simplex virus (HSV) infection 10/10/2007   Qualifier: Diagnosis of   By: Joyice MD, Harlene SCULL SNOMED Dx Update Oct 2024     History of molar pregnancy, antepartum 12/12/2008   History of tobacco use 06/09/2009   Qualifier: Diagnosis of   By: Manford Longs      History of smokeing 4-6 cigarettes/day since she was 48 years old.   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 Is motivated for weight loss. Not physically active.  Has trialed Metformin - d/c'd due to GI upset, Pheniramine - unable to tolerate, Saxenda  3mg  but was discontinued as it was not appropriate per last PCP. Has had success with Phentermine 37.5mg  but developed insomnia - opted to discontinued given unlikely long term improvement. Self discontinued Wellb   Sleep apnea    Symptomatic mammary hypertrophy 08/15/2020   Urge urinary incontinence    Vitamin D  deficiency    Wears contact lenses      Past Surgical History:  Procedure Laterality Date   BREAST REDUCTION SURGERY  05/10/2011   Procedure: MAMMARY REDUCTION BILATERAL (BREAST);  Surgeon: Elna LITTIE Pick;  Location: [facility];  Service: Plastics;  Laterality: Bilateral;  bilateral breast reduction   DILATION AND EVACUATION  12/26/2008   x 2   INTERSTIM IMPLANT PLACEMENT N/A 07/09/2014   Procedure: RENNA IMPLANT FIRST STAGE;  Surgeon: Glendia DELENA Elizabeth, MD;  Location: [facility];  Service: Urology;  Laterality: N/A;   INTERSTIM IMPLANT PLACEMENT N/A 07/09/2014   Procedure: RENNA IMPLANT SECOND STAGE;  Surgeon: Glendia DELENA Elizabeth, MD;  Location: [facility];  Service: Urology;  Laterality: N/A;   INTRAUTERINE DEVICE (IUD) INSERTION  sept 2011   mirena    WISDOM TOOTH EXTRACTION  as a teenager  Family History  Adopted: Yes  Problem Relation Age of Onset   Headache Neg Hx    Migraines Neg Hx      Social Connections: Socially Isolated (01/19/2024)   Social Connection and Isolation Panel    Frequency of Communication with Friends and Family: More than three times a week    Frequency of Social Gatherings with Friends and Family: More than three times a week    Attends Religious Services: Never    Database administrator or Organizations: No    Attends Banker Meetings: Never    Marital  Status: Never married     Current Outpatient Medications  Medication Instructions   ciclopirox (PENLAC) 8 % solution Topical, Daily at bedtime, Apply over nail and surrounding skin. Apply daily over previous coat. After seven (7) days, may remove with alcohol and continue cycle.   Estradiol  (DIVIGEL ) 0.5 MG/0.5GM GEL Apply to inner thighs daily   hydrOXYzine (ATARAX) 75 mg   lamoTRIgine (LAMICTAL) 400 mg, Daily   medroxyPROGESTERone  (PROVERA ) 5 mg, Oral, Daily, Use for twelve consecutive days every month while on estrogen therapy   neomycin -polymyxin-hydrocortisone  (CORTISPORIN) 3.5-10000-1 OTIC suspension Apply 1-2 drops daily after soaking and cover with bandaid   oxybutynin (DITROPAN-XL) 10 mg, Oral, Daily   temazepam (RESTORIL) 15 MG capsule 3 capsules, Daily     Physical Exam:   BP 122/74   Pulse 75   Ht 5' 4 (1.626 m)   Wt 125 lb (56.7 kg)   LMP 07/31/2020   SpO2 99%   BMI 21.46 kg/m   Salient findings:  CN II-XII intact Slightly anxious, tapping foot   Bilateral EAC clear and TM intact with well pneumatized middle ear spaces Anterior rhinoscopy: Septum midline  Nasal endoscopy was indicated to better evaluate the nose and paranasal sinuses, given the patient's history and exam findings, and is detailed below. No lesions of oral cavity/oropharynx No obviously palpable neck masses/lymphadenopathy/thyromegaly No respiratory distress or stridor  Seprately Identifiable Procedures:  Prior to initiating any procedures, risks/benefits/alternatives were explained to the patient and verbal consent obtained.  PROCEDURE (03/23/2024): Bilateral Diagnostic Rigid Nasal Endoscopy Pre-procedure diagnosis: Concern for nasal septal perforation Post-procedure diagnosis: same Indication: See pre-procedure diagnosis and physical exam above Complications: None apparent EBL: 0 mL Anesthesia: Lidocaine  4% and topical decongestant was topically sprayed in each nasal cavity  Description of  Procedure:  Patient was identified. A rigid 30 degree endoscope was utilized to evaluate the sinonasal cavities, mucosa, sinus ostia and turbinates and septum.  Overall, signs of mucosal inflammation are not noted.  Also noted are large (2cm) nasal septal perforation. Clean edges.  No mucopurulence, polyps, or masses noted.   Photodocumentation was obtained.  CPT CODE -- 68768 - Mod 25   Impression & Plans:  Kimberly Browning is a 48 y.o. female with   1. Nasal septal perforation     Assessment and Plan Assessment & Plan Nasal septal perforation with chronic nasal obstruction No reported history of nasal surgery or cocaine use. Differential includes systemic diseases. No spontaneous epistaxis. - Order laboratory tests for workup of nasal septal perforation (ANCA, ANA, RF, ESR, CRP, FTA-ABS, ACE, urine drug screen) - Coordinate with [facility] for testing at [facility]. - Schedule follow-up with facial plastics specialist for management of large nasal septal perforation    See below regarding exact medications prescribed this encounter including dosages and route: No orders of the defined types were placed in this encounter.   Thank you for allowing me  the opportunity to care for your patient. Please do not hesitate to contact me should you have any other questions.  Sincerely, Hadassah Parody, MD Otolaryngologist (ENT), [facility] Phone: [phone] Fax: [phone]

## 2024-03-26 ENCOUNTER — Ambulatory Visit (INDEPENDENT_AMBULATORY_CARE_PROVIDER_SITE_OTHER): Admitting: Urology

## 2024-03-26 VITALS — BP 120/78 | HR 81 | Ht 64.0 in | Wt 125.0 lb

## 2024-03-26 DIAGNOSIS — N3281 Overactive bladder: Secondary | ICD-10-CM | POA: Diagnosis not present

## 2024-03-26 DIAGNOSIS — R350 Frequency of micturition: Secondary | ICD-10-CM

## 2024-03-26 LAB — MICROSCOPIC EXAMINATION: Epithelial Cells (non renal): >10 /HPF — AB (ref 0–10)

## 2024-03-26 LAB — URINALYSIS, COMPLETE
Bilirubin, UA: NEGATIVE
Glucose, UA: NEGATIVE
Ketones, UA: NEGATIVE
Nitrite, UA: NEGATIVE
Protein,UA: NEGATIVE
RBC, UA: NEGATIVE
Specific Gravity, UA: 1.015 (ref 1.005–1.030)
Urobilinogen, Ur: 0.2 mg/dL (ref 0.2–1.0)
pH, UA: 6 (ref 5.0–7.5)

## 2024-03-26 NOTE — Patient Instructions (Signed)
  1.Patient will come in for lab appointment 2 weeks before Botox to make sure that you do not have a urinary tract infection (UTI)  2. If infection we will give you antibiotics to prevent UTI. 3. Cipro will be sent to your pharmacy to be taken, the day before the procedure, the day of the procedure and the day after the procedure. 4. Please come 30 minutes early so that we may numb your bladder with a local anesthetic and wait for it to take effect.

## 2024-03-26 NOTE — Progress Notes (Signed)
 03/26/2024 2:56 PM   Kimberly Browning Oct 25, 1975 [id number]  Referring provider: Janna Ferrier, DO [address] [place],  [place] [address]  No chief complaint on file.   HPI: SN: I placed an InterStim in 2016 and it was removed in 2017.  Was given oxybutynin for urinary frequency  Today I saw the patient back many years ago.  She was voiding every 15 to 30 minutes and could not sit through a 2-hour movie.  She failed Detrol and Vesicare.  She would leak a little bit on a trampoline.  Valsalva leak point pressure leaking a small amount was 160 cm water.  She had an overactive bladder on urodynamics with no pain.  She was not wearing a pad most days at the time.  She was given oxybutynin after failing Myrbetriq and there has been some psychiatric issues noted in the previous chart.  Botox was also discussed including PTNS  I saw her once in [place].  I noted she had gone from 40 times a day to 17 times a day with InterStim and Medicare stopped paying for percutaneous tibial nerve stimulation.  When I saw her she had very rare stress incontinence not wearing a pad except she might wear 1 on bad days.  She was still voiding every 30 minutes but the history was difficult.  I did not think a sling should be offered based upon mild stress incontinence and severe frequency.  4 years later in 2024 she was voiding every 1-2 hours.  She had lost 40 pounds.  Patient had the InterStim removed by myself in 2017 and I read the operative note.  She thought she still had the device on an MRI but it appears it was an IUD.  I said I will see her as needed  Patient still voids every 15 to 30 minutes.  No pain.  With sleeping medication she has no nocturia.  Clinically not infected.     PMH: Past Medical History:  Diagnosis Date   Anxiety    Bipolar disorder (HCC)    Depression    Dry skin    hands   Feeling of incomplete bladder emptying    GERD (gastroesophageal reflux disease)    Herpes simplex  virus (HSV) infection 10/10/2007   Qualifier: Diagnosis of   By: Joyice MD, Harlene SCULL SNOMED Dx Update Oct 2024     History of molar pregnancy, antepartum 12/12/2008   History of tobacco use 06/09/2009   Qualifier: Diagnosis of   By: Manford Longs      History of smokeing 4-6 cigarettes/day since she was 48 years old.   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 Is motivated for weight loss. Not physically active.  Has trialed Metformin - d/c'd due to GI upset, Pheniramine - unable to tolerate, Saxenda  3mg  but was discontinued as it was not appropriate per last PCP. Has had success with Phentermine 37.5mg  but developed insomnia - opted to discontinued given unlikely long term improvement. Self discontinued Wellb   Sleep apnea    Symptomatic mammary hypertrophy 08/15/2020   Urge urinary incontinence    Vitamin D  deficiency    Wears contact lenses     Surgical History: Past Surgical History:  Procedure Laterality Date   BREAST REDUCTION SURGERY  05/10/2011   Procedure: MAMMARY REDUCTION BILATERAL (BREAST);  Surgeon: Elna LITTIE Pick;  Location: [facility];  Service: Plastics;  Laterality: Bilateral;  bilateral breast reduction   DILATION AND EVACUATION  12/26/2008   x 2   INTERSTIM IMPLANT PLACEMENT N/A 07/09/2014   Procedure: RENNA IMPLANT FIRST STAGE;  Surgeon: Glendia DELENA Elizabeth, MD;  Location: [facility];  Service: Urology;  Laterality: N/A;   INTERSTIM IMPLANT PLACEMENT N/A 07/09/2014   Procedure: RENNA IMPLANT SECOND STAGE;  Surgeon: Glendia DELENA Elizabeth, MD;  Location[facility];  Service: Urology;  Laterality: N/A;   INTRAUTERINE DEVICE (IUD) INSERTION  sept 2011   mirena    WISDOM TOOTH EXTRACTION  as a teenager    Home  Medications:  Allergies as of 03/26/2024       Reactions   Geodon [ziprasidone Hydrochloride] Shortness Of Breath, Swelling   Hibiclens [chlorhexidine] Itching   Redness and itching after using CHG wipes   Sulfamethoxazole-trimethoprim Other (See Comments)   hallucinations   Doxycycline Other (See Comments)   Unknown reaction   Gabapentin Other (See Comments)   Unknown reaction   Other Other (See Comments)   Bath and body works body wash and lotion caused itching, reddness and rash   Penicillins Other (See Comments)   Unknown reaction Did it involve swelling of the face/tongue/throat, SOB, or low BP? Unknown Did it involve sudden or severe rash/hives, skin peeling, or any reaction on the inside of your mouth or nose? Unknown Did you need to seek medical attention at a hospital or doctor's office? Unknown When did it last happen?  unknown     If all above answers are "NO", may proceed with cephalosporin use.   Promethazine  Other (See Comments)   Depakote [divalproex Sodium] Rash        Medication List        Accurate as of March 26, 2024  2:56 PM. If you have any questions, ask your nurse or doctor.          ciclopirox 8 % solution Commonly known as: PENLAC Apply topically at bedtime. Apply over nail and surrounding skin. Apply daily over previous coat. After seven (7) days, may remove with alcohol and continue cycle.   Estradiol  0.5 MG/0.5GM Gel Commonly known as: Divigel  Apply to inner thighs daily   hydrOXYzine 50 MG tablet Commonly known as: ATARAX 75 mg.   lamoTRIgine 200 MG tablet Commonly known as: LAMICTAL Take 400 mg by mouth daily.   medroxyPROGESTERone  5 MG tablet Commonly known as: PROVERA  Take 1 tablet (5 mg total) by mouth daily. Use for twelve consecutive days every month while on estrogen therapy   neomycin -polymyxin-hydrocortisone  3.5-10000-1 OTIC suspension Commonly known as: CORTISPORIN Apply 1-2 drops daily after soaking and cover with  bandaid   oxybutynin 10 MG 24 hr tablet Commonly known as: DITROPAN-XL Take 1 tablet (10 mg total) by mouth daily.   temazepam 15 MG capsule Commonly known as: RESTORIL Take 3 capsules by mouth daily.        Allergies:  Allergies  Allergen Reactions   Geodon [Ziprasidone Hydrochloride] Shortness Of Breath and Swelling   Hibiclens [Chlorhexidine] Itching    Redness and itching after using CHG wipes   Sulfamethoxazole-Trimethoprim Other (See Comments)    hallucinations   Doxycycline Other (See Comments)    Unknown reaction   Gabapentin Other (See Comments)    Unknown reaction   Other Other (See  Comments)    Bath and body works body wash and lotion caused itching, reddness and rash   Penicillins Other (See Comments)    Unknown reaction Did it involve swelling of the face/tongue/throat, SOB, or low BP? Unknown Did it involve sudden or severe rash/hives, skin peeling, or any reaction on the inside of your mouth or nose? Unknown Did you need to seek medical attention at a hospital or doctor's office? Unknown When did it last happen?  unknown     If all above answers are "NO", may proceed with cephalosporin use.   Promethazine  Other (See Comments)   Depakote [Divalproex Sodium] Rash    Family History: Family History  Adopted: Yes  Problem Relation Age of Onset   Headache Neg Hx    Migraines Neg Hx     Social History:  reports that she quit smoking about 4 years ago. Her smoking use included cigarettes. She started smoking about 23 years ago. She has a 4.8 pack-year smoking history. She has been exposed to tobacco smoke. She has never used smokeless tobacco. She reports that she does not currently use alcohol. She reports that she does not use drugs.  ROS:                                        Physical Exam: LMP 07/31/2020   Constitutional:  Alert and oriented, No acute distress. HEENT: [place] AT, moist mucus membranes.  Trachea midline, no  masses.  Laboratory Data: Lab Results  Component Value Date   WBC 6.8 08/18/2023   HGB 13.6 08/18/2023   HCT 42.4 08/18/2023   MCV 85 08/18/2023   PLT 302 08/18/2023    Lab Results  Component Value Date   CREATININE 0.98 03/01/2024    No results found for: PSA  No results found for: TESTOSTERONE  Lab Results  Component Value Date   HGBA1C 5.3 02/14/2024    Urinalysis    Component Value Date/Time   COLORURINE AMBER (A) 08/05/2020 2051   APPEARANCEUR Clear 03/21/2024 1303   LABSPEC 1.027 08/05/2020 2051   PHURINE 6.0 08/05/2020 2051   GLUCOSEU Negative 03/21/2024 1303   HGBUR NEGATIVE 08/05/2020 2051   HGBUR negative 04/24/2009 1100   BILIRUBINUR Negative 03/21/2024 1303   KETONESUR negative 03/15/2024 1446   KETONESUR 5 (A) 08/05/2020 2051   PROTEINUR Negative 03/21/2024 1303   PROTEINUR 30 (A) 08/05/2020 2051   UROBILINOGEN 0.2 03/15/2024 1446   UROBILINOGEN 0.2 04/24/2009 1100   NITRITE Negative 03/21/2024 1303   NITRITE NEGATIVE 08/05/2020 2051   LEUKOCYTESUR Trace (A) 03/21/2024 1303   LEUKOCYTESUR TRACE (A) 08/05/2020 2051    Pertinent Imaging:   Assessment & Plan: Patient has severe refractory frequency.  She would like to do Botox.  Full template discussed.  We have reached the end of treatment pathway.  She did not want to try Gemtesa.  She want to have it done today but of course we could not accommodate that.  Urine looked normal but I sent her for culture.  Usual protocol ordered  1. OAB (overactive bladder) (Primary)   2. Urinary frequency    No follow-ups on file.  Glendia DELENA Elizabeth, MD  [facility] [address] [place], [place] [address] [phone]

## 2024-03-27 ENCOUNTER — Encounter: Payer: Self-pay | Admitting: Obstetrics and Gynecology

## 2024-03-27 ENCOUNTER — Ambulatory Visit (INDEPENDENT_AMBULATORY_CARE_PROVIDER_SITE_OTHER): Admitting: Obstetrics and Gynecology

## 2024-03-27 VITALS — BP 130/74 | HR 65 | Ht 64.0 in | Wt 128.3 lb

## 2024-03-27 DIAGNOSIS — N951 Menopausal and female climacteric states: Secondary | ICD-10-CM

## 2024-03-27 DIAGNOSIS — Z136 Encounter for screening for cardiovascular disorders: Secondary | ICD-10-CM | POA: Diagnosis not present

## 2024-03-27 DIAGNOSIS — Z5941 Food insecurity: Secondary | ICD-10-CM | POA: Diagnosis not present

## 2024-03-27 NOTE — Progress Notes (Signed)
 Pt presents for menopause symptoms (Hot flashes). No other questions or concerns at this time.

## 2024-03-27 NOTE — Progress Notes (Signed)
 GYNECOLOGY OFFICE FOLLOW UP NOTE  History:  48 y.o. [id number] here today for follow up for hot flashes.  Patient last had a period about 3 years ago. She was prescribed vaginal estrogen and provera  and did not take the estrogen due to cost, took the provera  but states that her symptoms did not improve with provera . She is concerned about taking estrogen and starting a period again, which she does not want.  She is miserable due to hot flashes, states she has symptoms all the time and has trouble functioning due to them.   She is scheduled for botox in bladder in January.   Past Medical History:  Diagnosis Date   Anxiety    Bipolar disorder (HCC)    Depression    Dry skin    hands   Feeling of incomplete bladder emptying    GERD (gastroesophageal reflux disease)    Herpes simplex virus (HSV) infection 10/10/2007   Qualifier: Diagnosis of   By: Joyice MD, Harlene SCULL SNOMED Dx Update Oct 2024     History of molar pregnancy, antepartum 12/12/2008   History of tobacco use 06/09/2009   Qualifier: Diagnosis of   By: Manford Longs      History of smokeing 4-6 cigarettes/day since she was 48 years old.   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 Is motivated for weight loss. Not physically active.  Has trialed Metformin - d/c'd due to GI upset, Pheniramine - unable to tolerate, Saxenda  3mg  but was discontinued as it was not appropriate per last PCP. Has had success with Phentermine 37.5mg  but developed insomnia - opted to discontinued given unlikely long term improvement. Self discontinued Wellb   Sleep apnea    Symptomatic mammary hypertrophy 08/15/2020   Urge urinary incontinence    Vitamin D  deficiency    Wears contact lenses     Past Surgical History:  Procedure Laterality Date    BREAST REDUCTION SURGERY  05/10/2011   Procedure: MAMMARY REDUCTION BILATERAL (BREAST);  Surgeon: Elna LITTIE Pick;  Location: [facility];  Service: Plastics;  Laterality: Bilateral;  bilateral breast reduction   DILATION AND EVACUATION  12/26/2008   x 2   INTERSTIM IMPLANT PLACEMENT N/A 07/09/2014   Procedure: RENNA IMPLANT FIRST STAGE;  Surgeon: Glendia DELENA Elizabeth, MD;  Location: [facility];  Service: Urology;  Laterality: N/A;   INTERSTIM IMPLANT PLACEMENT N/A 07/09/2014   Procedure: RENNA IMPLANT SECOND STAGE;  Surgeon: Glendia DELENA Elizabeth, MD;  Location: [facility];  Service: Urology;  Laterality: N/A;   INTRAUTERINE DEVICE (IUD) INSERTION  sept 2011   mirena    WISDOM TOOTH EXTRACTION  as a teenager     Current Outpatient Medications:    ciclopirox (PENLAC) 8 % solution, Apply topically at bedtime. Apply over nail and surrounding skin. Apply daily over previous coat. After seven (7) days, may remove with alcohol and continue cycle., Disp: 6.6 mL, Rfl: 11   hydrOXYzine (ATARAX) 50 MG tablet, 75 mg., Disp: , Rfl:    lamoTRIgine (LAMICTAL) 200 MG tablet, Take 400 mg by mouth daily., Disp: , Rfl:    oxybutynin (DITROPAN-XL) 10 MG 24 hr tablet, Take 1 tablet (10 mg total) by mouth daily., Disp:  30 tablet, Rfl: 11   temazepam (RESTORIL) 15 MG capsule, Take 3 capsules by mouth daily., Disp: , Rfl:   The following portions of the patient's history were reviewed and updated as appropriate: allergies, current medications, past family history, past medical history, past social history, past surgical history and problem list.   Review of Systems:  Pertinent items noted in HPI and remainder of comprehensive ROS otherwise negative.   Objective:  Physical Exam BP 130/74   Pulse 65   Ht 5' 4 (1.626 m)   Wt 128 lb 4.8 oz (58.2 kg)   LMP 07/31/2020   BMI 22.02 kg/m  CONSTITUTIONAL: Well-developed, well-nourished female in no acute distress.  Pressured speech. HENT:  Normocephalic, atraumatic. External right and left ear normal. Oropharynx is clear and moist EYES: Conjunctivae and EOM are normal. Pupils are equal, round, and reactive to light. No scleral icterus.  NECK: Normal range of motion, supple, no masses SKIN: Skin is warm and dry. No rash noted. Not diaphoretic. No erythema. No pallor. NEUROLOGIC: Alert and oriented to person, place, and time. Normal reflexes, muscle tone coordination. No cranial nerve deficit noted. PSYCHIATRIC: Normal mood and affect. Normal behavior. Normal judgment and thought content. CARDIOVASCULAR: Normal heart rate noted RESPIRATORY: Effort normal, no problems with respiration noted ABDOMEN: Soft, no distention noted.   PELVIC: deferred MUSCULOSKELETAL: Normal range of motion. No edema noted.  Labs and Imaging No results found.  Assessment & Plan:  1. Menopausal symptoms (Primary) -Patient reports she had no improvement on provera  alone, I explained that the symptoms would be improved by estrogen cream and the provera  was for prevention of endometrial proliferation or uterine cancer. She reports she cannot take the estrogen cream due to cost and wanting to know about other options - I reviewed non-hormonal options such as SSRI/SNRIs, she does not want to take another pill as she is already on 5 and doesn't want to take any more - I reviewed the combipatch  for symptoms, including risks/benefits and she states she tried this once but the patch came off in the pool - she denies h/o HTN, VTE or other contra-indications - I reviewed that there are no other options aside from the patch, pills and creams for her symptoms - she would like to try combipatch  and we reviewed strategies for improving adherence - will have her get fasting lipid panel as she has not had one in 3 years to assess 10 yr CVD risk prior to prescribing, though presumably she is in low risk category  2. Food insecurity - Patient  requesting resources as she is worried about eating due to [facility] benefits being cut off due to govt shutdown - offered SW services as a Theatre stage manager for food as well as Higher education careers adviser - she is agreeable to SW contacting her    Routine preventative health maintenance measures emphasized. Please refer to After Visit Summary for other counseling recommendations.   Return in about 2 months (around 05/27/2024) for annual.   K. Yolanda Moats, MD, [facility])

## 2024-03-28 LAB — CULTURE, URINE COMPREHENSIVE

## 2024-03-29 ENCOUNTER — Telehealth: Payer: Self-pay | Admitting: Urology

## 2024-03-29 NOTE — Telephone Encounter (Signed)
 Pt called and requested a call back pertaining to results from office visit 03/26/2024 with Dr. Gaston.

## 2024-04-02 NOTE — Telephone Encounter (Signed)
 Pt was informed UA and urine culture were normal, pt explained that Epithelial Cells were normal. Pt was advise to get here 30 minutes early for her botox appt to number her bladder with lidocaine  so the procedure won't be too uncomfortable. Pt voiced understanding.

## 2024-04-02 NOTE — Telephone Encounter (Signed)
 LVM for pt to return call.   Pt's UA and urine culture was normal

## 2024-04-02 NOTE — Telephone Encounter (Signed)
 PT returned call.  I read message from Dr Gaston, but she still has some questions.  Can you call her back please?

## 2024-04-03 ENCOUNTER — Telehealth (INDEPENDENT_AMBULATORY_CARE_PROVIDER_SITE_OTHER): Payer: Self-pay

## 2024-04-03 NOTE — Telephone Encounter (Signed)
 Holly thank you I have already spoke with her.

## 2024-04-03 NOTE — Telephone Encounter (Signed)
 I had sent a message to [facility] explaining everything that had gone on and what the Pt was requesting. Masciello stated she was going to call Pt to explain again that what the Pt is needing done no body in [place] can do it that is why she is sending her to [place] pt understood. Masciello explain the places that can see her for what she needs done she can either go to [facility] or [place]. Pt decided to stay with [place].

## 2024-04-03 NOTE — Telephone Encounter (Signed)
 The patient called back, she is declining your referral for the ENT in [place], she wants to go to the [facility] in [place] instead. I let her know I would pass this request on to the provider.

## 2024-04-03 NOTE — Telephone Encounter (Signed)
 Pt called I spoke with Pt concerning her referral to [place] Pt stated she refuses to go to [place] and wants to go to [facility] instead, I explained to the Pt why masciello wanted her to go to [place] Pt stated she understood, then called back declining our referral to [place] and said she wants the referral sent to [facility] again. I also checked and the labs that were ordered have not been completed yet as well.

## 2024-04-04 ENCOUNTER — Ambulatory Visit: Admitting: Obstetrics and Gynecology

## 2024-04-10 ENCOUNTER — Ambulatory Visit (INDEPENDENT_AMBULATORY_CARE_PROVIDER_SITE_OTHER): Payer: Self-pay | Admitting: Podiatry

## 2024-04-10 DIAGNOSIS — Z91199 Patient's noncompliance with other medical treatment and regimen due to unspecified reason: Secondary | ICD-10-CM

## 2024-04-12 NOTE — Progress Notes (Signed)
 Patient was no-show for appointment today

## 2024-04-16 ENCOUNTER — Other Ambulatory Visit

## 2024-04-16 DIAGNOSIS — N951 Menopausal and female climacteric states: Secondary | ICD-10-CM

## 2024-04-16 DIAGNOSIS — Z136 Encounter for screening for cardiovascular disorders: Secondary | ICD-10-CM

## 2024-04-17 LAB — LIPID PANEL
Chol/HDL Ratio: 3.9 ratio (ref 0.0–4.4)
Cholesterol, Total: 290 mg/dL — ABNORMAL HIGH (ref 100–199)
HDL: 74 mg/dL (ref >39)
LDL Chol Calc (NIH): 195 mg/dL — ABNORMAL HIGH (ref 0–99)
Triglycerides: 119 mg/dL (ref 0–149)
VLDL Cholesterol Cal: 21 mg/dL (ref 5–40)

## 2024-04-18 ENCOUNTER — Ambulatory Visit: Payer: Self-pay | Admitting: Obstetrics and Gynecology

## 2024-04-18 ENCOUNTER — Other Ambulatory Visit

## 2024-04-20 ENCOUNTER — Telehealth (INDEPENDENT_AMBULATORY_CARE_PROVIDER_SITE_OTHER): Payer: Self-pay

## 2024-04-20 ENCOUNTER — Ambulatory Visit (INDEPENDENT_AMBULATORY_CARE_PROVIDER_SITE_OTHER)

## 2024-04-20 ENCOUNTER — Ambulatory Visit: Admitting: Family Medicine

## 2024-04-20 NOTE — Telephone Encounter (Signed)
 Thank you I will call patient back and let her know she needs to call the office in [place] that we referred her to for further assistance.

## 2024-04-20 NOTE — Telephone Encounter (Signed)
 The patient called in, she has not gotten her labs done yet, but she wants to report that every time she blows her nose it is bloody.  She is not having full-on nose bleeds but wants to know what she needs to do.  I let her know that Dr CHRISTELLA is not in the office today. Please advise.

## 2024-04-20 NOTE — Telephone Encounter (Signed)
 Patient called and stated that she was told by [facility] that you did not need labs done.  Patient stated that nothing changed but her cholesterol.  Please call her at [phone].  Patient wants to know why Dr. Greggory wanted more labs done.

## 2024-04-20 NOTE — Telephone Encounter (Signed)
 I called [facility] because patient had stated that [facility] had told the patient not to get the lab work done that Dr. Greggory had ordered. When I spoke with [facility] they had stated that the only encounter that they have had with her is when they scheduled the patients appointment. The patient has not spoken with a nurse or anyone from there, [facility] also stated that they would have called [facility] before telling the patient not to get them done to see why we had ordered the labs to begin with. I thanked [facility] for their time.   I called patient to ask what is going on patient did not answer so I left a voice mail for her to call us  back.

## 2024-04-20 NOTE — Telephone Encounter (Signed)
 I called [facility] to confirm they did not tell her anything. The last time [facility] has talked to her was in October when she scheduled her appointment I tried to call her back she did not answer if she calls again please send her straight back to me [phone] thank you

## 2024-04-20 NOTE — Telephone Encounter (Signed)
 Patient called in stating that she doesn't understand why she needs the lab work so I explained why the labwork was ordered and that the other doctors office is expecting it as well.

## 2024-04-20 NOTE — Telephone Encounter (Signed)
 Called patient back to let them know Dr. Greggory was not in office but a referral to [place] [facility] was sent out for them to take over care, that she could call them for advice if it is to concerning patient understood.

## 2024-04-23 ENCOUNTER — Ambulatory Visit (INDEPENDENT_AMBULATORY_CARE_PROVIDER_SITE_OTHER)

## 2024-04-23 ENCOUNTER — Other Ambulatory Visit: Payer: Self-pay | Admitting: Obstetrics and Gynecology

## 2024-04-23 DIAGNOSIS — N951 Menopausal and female climacteric states: Secondary | ICD-10-CM

## 2024-04-23 MED ORDER — COMBIPATCH 0.05-0.14 MG/DAY TD PTTW
1.0000 | MEDICATED_PATCH | TRANSDERMAL | 2 refills | Status: AC
Start: 2024-04-23 — End: ?

## 2024-04-23 NOTE — Progress Notes (Signed)
 Patient with vasomotor symptoms, offered combi-patch pending Lipid panel results for CVD risk, she is in low risk category. Will send low dose combipatch  and have her follow up 2 months.

## 2024-04-24 ENCOUNTER — Ambulatory Visit: Admitting: Dermatology

## 2024-04-24 ENCOUNTER — Encounter: Payer: Self-pay | Admitting: Dermatology

## 2024-04-24 DIAGNOSIS — B351 Tinea unguium: Secondary | ICD-10-CM | POA: Diagnosis not present

## 2024-04-24 DIAGNOSIS — Z79899 Other long term (current) drug therapy: Secondary | ICD-10-CM

## 2024-04-24 DIAGNOSIS — B353 Tinea pedis: Secondary | ICD-10-CM | POA: Diagnosis not present

## 2024-04-24 DIAGNOSIS — Z7189 Other specified counseling: Secondary | ICD-10-CM

## 2024-04-24 MED ORDER — CICLOPIROX OLAMINE 0.77 % EX CREA: TOPICAL_CREAM | Freq: Every day | CUTANEOUS | 11 refills | Status: AC

## 2024-04-24 NOTE — Progress Notes (Unsigned)
   Follow-Up Visit   Subjective  Kimberly Browning is a 48 y.o. 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  Objective  Well appearing patient in no apparent distress; mood and affect are within normal limits.  A focused examination was performed of the following areas: Feet, toenails  Relevant exam findings are noted in the Assessment and Plan.    Assessment & Plan   TINEA PEDIS / TINEA UNGUIUM Positive molecular study showing malassezia, trichophyton rubrum, trichophyton soudanense Feet, toenails Exam: Scaling and maceration web spaces and over distal and lateral soles, toenail dystrophy.  Treatment Plan: Discussed using otc topical Lotrimin or otc Lamisil  cream since she did not want to use the topical anti-- fungal ketoconazole  2% cr Discussed Terbinafine  again and patient declines any oral treatment Start Ciclopirox cr qhs to feet and in between toes Cont Ciclopirox sol to toenails qhs   Long term medication management.  Patient is using long term (months to years) prescription medication  to control their dermatologic condition.  These medications require periodic monitoring to evaluate for efficacy and side effects and may require periodic laboratory monitoring. COUNSELING AND COORDINATION OF CARE   MEDICATION MANAGEMENT   TINEA UNGUIUM   Related Medications ciclopirox (PENLAC) 8 % solution Apply topically at bedtime. Apply over nail and surrounding skin. Apply daily  over previous coat. After seven (7) days, may remove with alcohol and continue cycle. TINEA PEDIS OF BOTH FEET   Extensive time reviewing treatment options and discussion with patient.   Return in about 1 year (around 04/24/2025) for Tinea Pedis/Unguium.  I, Grayce Saunas, RMA, am acting as scribe for Alm Rhyme, MD .   Documentation: I have reviewed the above documentation for accuracy and completeness, and I agree with the above.  Alm Rhyme, MD

## 2024-04-24 NOTE — Patient Instructions (Signed)

## 2024-04-25 ENCOUNTER — Encounter: Payer: Self-pay | Admitting: Dermatology

## 2024-04-25 ENCOUNTER — Telehealth: Payer: Self-pay | Admitting: Lactation Services

## 2024-04-25 LAB — DRUG PROFILE 799080: PHENCYCLIDINE: NEGATIVE

## 2024-04-25 LAB — DRUG PROFILE 799031
BENZODIAZEPINES: POSITIVE — AB
HYDROXYALPRAZOLAM: NEGATIVE
NORDIAZEPAM: NEGATIVE
Oxazepam GC/MS Conf: 4338 ng/mL
Oxazepam: POSITIVE — AB

## 2024-04-25 LAB — URINE DRUGS OF ABUSE SCREEN W ALC, ROUTINE (REF LAB)
Amphetamines, Urine: NEGATIVE ng/mL
Barbiturate Quant, Ur: NEGATIVE ng/mL
Cannabinoid Quant, Ur: NEGATIVE ng/mL
Cocaine (Metab.): NEGATIVE ng/mL
Creatinine, Urine: 91.2 mg/dL (ref 20.0–300.0)
Ethanol, Urine: NEGATIVE %
Methadone Screen, Urine: NEGATIVE ng/mL
Nitrite Urine, Quantitative: NEGATIVE ug/mL
OPIATE SCREEN URINE: NEGATIVE ng/mL
Propoxyphene: NEGATIVE ng/mL
pH, Urine: 5 (ref 4.5–8.9)

## 2024-04-25 NOTE — Telephone Encounter (Signed)
 PA for CombiPatch  submitted through Covermymeds, awaiting determination.    Reena Sharps (Key: [id number]) PA Case ID #: [id number] Rx #: [id number] Need Help? Call us  at [phone] Status sent iconSent to Plan today Drug CombiPatch  0.05-0.14MG /DAY biweekly patches ePA cloud logo Form [facility] Electronic PA Form Original Claim Info 54,MR,70,A5 NON-FORMULARY DRUG; CALL [phone]

## 2024-04-26 ENCOUNTER — Encounter: Payer: Self-pay | Admitting: Family Medicine

## 2024-04-26 ENCOUNTER — Ambulatory Visit (INDEPENDENT_AMBULATORY_CARE_PROVIDER_SITE_OTHER): Admitting: Family Medicine

## 2024-04-26 VITALS — BP 110/79 | HR 75 | Wt 130.2 lb

## 2024-04-26 DIAGNOSIS — L602 Onychogryphosis: Secondary | ICD-10-CM | POA: Diagnosis not present

## 2024-04-26 DIAGNOSIS — E7800 Pure hypercholesterolemia, unspecified: Secondary | ICD-10-CM

## 2024-04-26 DIAGNOSIS — Z713 Dietary counseling and surveillance: Secondary | ICD-10-CM | POA: Diagnosis not present

## 2024-04-26 MED ORDER — ROSUVASTATIN CALCIUM 20 MG PO TABS: 20.0000 mg | ORAL_TABLET | Freq: Every day | ORAL | 3 refills | Status: AC

## 2024-04-26 NOTE — Assessment & Plan Note (Signed)
 Total cholesterol 290 mg/dL, LDL 804 mg/dL. Statins recommended to reduce LDL and cardiovascular risk. Discussed muscle soreness as a potential side effect. She expressed concern due to existing muscle issues but will let us  know if this occurs. - Prescribed Crestor (rosuvastatin) 20 mg daily, can decrease if muscle sx occur - Follow up in 4-6 weeks to assess response. - Consider evaluation for familial hypercholesterolemia as indicated

## 2024-04-26 NOTE — Patient Instructions (Addendum)
 I sent in Crestor to take daily. Let me know if you have any muscle cramping.  Come back in 4-6 weeks to recheck. We can also continue to talk about healthy lifestyle with diet and exercise at that time, but you are doing an amazing job so far!

## 2024-04-26 NOTE — Progress Notes (Signed)
   SUBJECTIVE:   CHIEF COMPLAINT / HPI:  Discussed the use of AI scribe software for clinical note transcription with the patient, who gave verbal consent to proceed.  History of Present Illness Kimberly Browning is a 48 year old female who presents for management of high cholesterol.  Hyperlipidemia - Total cholesterol 290 mg/dL, LDL 804 mg/dL on recent lipid profile - No prior use of lipid-lowering medications - Has history of muscle soreness - No personal history of myocardial infarction or cerebrovascular accident - Family history unknown due to adoption; stepfather has hyperlipidemia  Dietary modification - Reduced intake of red meat and fried foods - Increased consumption of grilled chicken, low-sugar yogurt - Minimal fast food intake, limited to occasional hot chocolate  Weight fluctuation - Lost 50 pounds previously while on Trulicity  - Recent weight gain from 125 to 130 pounds - Not currently on Trulicity  due to insurance requirements related to BMI - Interested in further weight loss  Dermatologic symptoms - Currently experiencing foot fungus - Has not started treatment with cyclosporox   OBJECTIVE:  BP 110/79   Pulse 75   Wt 130 lb 3.2 oz (59.1 kg)   LMP 07/31/2020   SpO2 100%   BMI 22.35 kg/m   Physical Exam GENERAL: Alert, cooperative, well developed, no acute distress. CHEST: Breathing and speaking comfortably on RA. NEUROLOGICAL: Cranial nerves grossly intact, moves all extremities without gross motor or sensory deficit.  ASSESSMENT/PLAN:   Assessment & Plan Pure hypercholesterolemia Total cholesterol 290 mg/dL, LDL 804 mg/dL. Statins recommended to reduce LDL and cardiovascular risk. Discussed muscle soreness as a potential side effect. She expressed concern due to existing muscle issues but will let us  know if this occurs. - Prescribed Crestor (rosuvastatin) 20 mg daily, can decrease if muscle sx occur - Follow up in 4-6 weeks to assess response. -  Consider evaluation for familial hypercholesterolemia as indicated Dietary counseling BMI 22. She does not qualify for medical weight loss therapies, and she is actually at a very healthy BMI. Discussed lifestyle and gym options to remain healthy. - Encouraged continued lifestyle modifications to remain healthy. - Discussed [employer] gym membership for physical activity. 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  Stuart Redo, MD [facility]

## 2024-04-26 NOTE — Assessment & Plan Note (Signed)
 BMI 22. She does not qualify for medical weight loss therapies, and she is actually at a very healthy BMI. Discussed lifestyle and gym options to remain healthy. - Encouraged continued lifestyle modifications to remain healthy. - Discussed [employer] gym membership for physical activity.

## 2024-04-27 ENCOUNTER — Telehealth (INDEPENDENT_AMBULATORY_CARE_PROVIDER_SITE_OTHER): Payer: Self-pay

## 2024-04-27 NOTE — Telephone Encounter (Signed)
 Pt LVM. It sounded like she is calling to get path report from her nose surgery. I called her back and LVM that she needs to call the office back to give me clarity of what she is needing and left office #.

## 2024-04-27 NOTE — Telephone Encounter (Signed)
 Patient called in regarding Lab results and wanted to know what the next steps are.  Please advise.  Thank you.

## 2024-04-30 ENCOUNTER — Telehealth (INDEPENDENT_AMBULATORY_CARE_PROVIDER_SITE_OTHER): Payer: Self-pay

## 2024-04-30 ENCOUNTER — Telehealth: Payer: Self-pay

## 2024-04-30 NOTE — Telephone Encounter (Signed)
 Patient calls nurse line requesting to speak with PCP.   She reports she had to do a drug screen for ENT.   She reports she tested positive (see labs) for benzodiazepines and oxazepam.   She reports she wants to make sure these ingredients are in her current medications and she wasn't given something without her consent.  Advised we do not prescribe her PSY medications.   She reports she did call he behavorial health provider, however she has not heard back from them. Advised to be patient for their call.   Will forward to PCP.

## 2024-04-30 NOTE — Telephone Encounter (Signed)
 I had already talked to her this morning when she called with questions about her results and what they meant. She told me then she understood I explained she needs to call [facility] for further assistance.

## 2024-04-30 NOTE — Telephone Encounter (Signed)
 Patient called with questions about her Drug test results. I explained to her what popped positive Patient stated she did not understand how they were in her system because she was not prescribed the medication I explained to the patient if it shows positive then it was ingested. Patient understood and stated she was going to call her PCP to find out.

## 2024-05-02 NOTE — Telephone Encounter (Signed)
 Combi patch denied, preferred alternatives listed below.   Kimberly Browning (Key: [id number]) PA Case ID #: [id number] Rx #: [id number] Need Help? Call us  at [phone] Outcome Denied on November 19 by [facility] NCPDP 2017 The drug you asked for is not listed in your preferred drug list (formulary). The preferred drug(s), you may not have tried, are: estradiol -norethindrone acet 0.5 mg-0.1 mg tablet estradiol -norethindrone acet 1 mg-0.5 mg tablet Your provider needs to give us  medical reasons why the preferred drug(s) would not work for you and/or would have bad side effects. Sometimes a preferred drug needs more review for approval. Additionally, some preferred drugs listed may be the same drugs with different strengths or forms. [employer] may only require one strength or form of that drug to be tried. This decision was from [facility] Non-Formulary Exceptions Coverage Policy at [email or web address]. Drug CombiPatch  0.05-0.14MG /DAY biweekly patches ePA cloud logo [employer] Electronic PA Form Original Claim Info 54,MR,70,A5 NON-FORMULARY DRUG; CALL [phone]   Why was coverage for this drug denied? We denied coverage for this drug because: The drug you asked for is not listed in your preferred drug list (formulary). The preferred  drug(s), you may not have tried, are:  estradiol -norethindrone acet 0.5 mg-0.1 mg tablet  estradiol -norethindrone acet 1 mg-0.5 mg table  Will Route to Dr. Nicholaus for advisement.

## 2024-05-17 ENCOUNTER — Telehealth: Payer: Self-pay

## 2024-05-17 NOTE — Telephone Encounter (Signed)
 Patient calls nurse line requesting refill on nausea medication.   She does not have the name of prescription she was previously on.   Per chart review, she was prescribed Reglan  most recently in 2024.  Patient is asking if she can receive refill without having to schedule OV.   Please advise.   Chiquita JAYSON English, RN

## 2024-05-18 MED ORDER — METOCLOPRAMIDE HCL 5 MG PO TABS: 5.0000 mg | ORAL_TABLET | Freq: Three times a day (TID) | ORAL | 0 refills | Status: AC | PRN

## 2024-05-25 ENCOUNTER — Ambulatory Visit: Admitting: Family Medicine

## 2024-05-25 ENCOUNTER — Other Ambulatory Visit: Payer: Self-pay

## 2024-05-25 DIAGNOSIS — R350 Frequency of micturition: Secondary | ICD-10-CM

## 2024-06-04 ENCOUNTER — Ambulatory Visit: Admitting: Urology

## 2024-06-04 ENCOUNTER — Other Ambulatory Visit

## 2024-06-04 ENCOUNTER — Encounter: Payer: Self-pay | Admitting: Urology

## 2024-06-05 ENCOUNTER — Other Ambulatory Visit

## 2024-06-05 DIAGNOSIS — R350 Frequency of micturition: Secondary | ICD-10-CM

## 2024-06-06 LAB — URINALYSIS, COMPLETE
Bilirubin, UA: NEGATIVE
Glucose, UA: NEGATIVE
Ketones, UA: NEGATIVE
Leukocytes,UA: NEGATIVE
Nitrite, UA: NEGATIVE
Protein,UA: NEGATIVE
RBC, UA: NEGATIVE
Specific Gravity, UA: 1.01 (ref 1.005–1.030)
Urobilinogen, Ur: 0.2 mg/dL (ref 0.2–1.0)
pH, UA: 6 (ref 5.0–7.5)

## 2024-06-06 LAB — MICROSCOPIC EXAMINATION
Bacteria, UA: NONE SEEN
Epithelial Cells (non renal): >10 /HPF — AB (ref 0–10)
RBC, Urine: NONE SEEN /HPF (ref 0–2)

## 2024-06-10 LAB — CULTURE, URINE COMPREHENSIVE

## 2024-06-11 ENCOUNTER — Other Ambulatory Visit

## 2024-06-12 ENCOUNTER — Ambulatory Visit: Payer: Self-pay

## 2024-06-12 DIAGNOSIS — R350 Frequency of micturition: Secondary | ICD-10-CM

## 2024-06-12 DIAGNOSIS — N3281 Overactive bladder: Secondary | ICD-10-CM

## 2024-06-12 MED ORDER — CIPROFLOXACIN HCL 250 MG PO TABS: 250.0000 mg | ORAL_TABLET | Freq: Two times a day (BID) | ORAL | 0 refills | Status: AC

## 2024-06-14 ENCOUNTER — Telehealth: Payer: Self-pay

## 2024-06-14 NOTE — Telephone Encounter (Signed)
 Auth Submission: NO AUTH NEEDED Site of care: Urology Payer: Medicare A/B and [place] Medicaid Medication & CPT/J Code(s) submitted: Botox  Diagnosis Code:  Route of submission (phone, fax, portal):  Phone # Fax # Auth type: Buy/Bill PB Units/visits requested:  Reference number:  Approval from: 06/14/24 to 07/07/25

## 2024-06-18 ENCOUNTER — Ambulatory Visit: Admitting: Urology

## 2024-06-18 VITALS — BP 136/81 | HR 79 | Ht 64.0 in

## 2024-06-18 DIAGNOSIS — R350 Frequency of micturition: Secondary | ICD-10-CM

## 2024-06-18 DIAGNOSIS — N3281 Overactive bladder: Secondary | ICD-10-CM

## 2024-06-18 MED ORDER — ONABOTULINUMTOXINA 100 UNITS IJ SOLR
100.0000 [IU] | Freq: Once | INTRAMUSCULAR | Status: AC
  Administered 2024-06-18: 100 [IU] via INTRAMUSCULAR

## 2024-06-18 MED ORDER — LIDOCAINE HCL URETHRAL/MUCOSAL 2 % EX GEL
1.0000 | Freq: Once | CUTANEOUS | Status: AC
  Administered 2024-06-18: 1 via URETHRAL

## 2024-06-18 NOTE — Progress Notes (Signed)
 "  06/18/2024 11:16 AM   Kimberly Browning July 23, 1975 [id number]  Referring provider: Janna Ferrier, DO [address] [place],  [place] [address]  Chief Complaint  Patient presents with   Over Active Bladder    HPI: SN: I placed an InterStim in 2016 and it was removed in 2017.  Was given oxybutynin  for urinary frequency   Today I saw the patient back many years ago.  She was voiding every 15 to 30 minutes and could not sit through a 2-hour movie.  She failed Detrol and Vesicare.  She would leak a little bit on a trampoline.  Valsalva leak point pressure leaking a small amount was 160 cm water.  She had an overactive bladder on urodynamics with no pain.  She was not wearing a pad most days at the time.  She was given oxybutynin  after failing Myrbetriq and there has been some psychiatric issues noted in the previous chart.  Botox  was also discussed including PTNS   I saw her once in [place].  I noted she had gone from 40 times a day to 17 times a day with InterStim and Medicare stopped paying for percutaneous tibial nerve stimulation.  When I saw her she had very rare stress incontinence not wearing a pad except she might wear 1 on bad days.  She was still voiding every 30 minutes but the history was difficult.  I did not think a sling should be offered based upon mild stress incontinence and severe frequency.  4 years later in 2024 she was voiding every 1-2 hours.  She had lost 40 pounds.  Patient had the InterStim removed by myself in 2017 and I read the operative note.  She thought she still had the device on an MRI but it appears it was an IUD.  I said I will see her as needed   Patient still voids every 15 to 30 minutes.  No pain.  With sleeping medication she has no nocturia.  Clinically not infected.    Patient has severe refractory frequency. She would like to do Botox . Full template discussed. We have reached the end of treatment pathway. She did not want to try Gemtesa. She want to have  it done today but of course we could not accommodate that. Urine looked normal but I sent her for culture. Usual protocol ordered   Today Frequency stable.  Nocturia stable.  Clinically not infected.  She had a positive urine culture treated.  Patient underwent flexible cystoscopy.  Bladder mucosa and trigone were normal.  No cystitis.  No carcinoma.  I injected 100 units of Botox  and 10 cc of normal saline with modified template.  She tolerated beautifully.  No bleeding.  She will be followed as per protocol   PMH: Past Medical History:  Diagnosis Date   Anxiety    Bipolar disorder (HCC)    Depression    Dry skin    hands   Feeling of incomplete bladder emptying    GERD (gastroesophageal reflux disease)    Herpes simplex virus (HSV) infection 10/10/2007   Qualifier: Diagnosis of   By: Joyice MD, Harlene SCULL SNOMED Dx Update Oct 2024     History of molar pregnancy, antepartum 12/12/2008   History of tobacco use 06/09/2009   Qualifier: Diagnosis of   By: Manford Longs      History of smokeing 4-6 cigarettes/day since she was 49 years old.   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 Is motivated for weight loss. Not physically active.  Has trialed Metformin - d/c'd due to GI upset, Pheniramine - unable to tolerate, Saxenda  3mg  but was discontinued as it was not appropriate per last PCP. Has had success with Phentermine 37.5mg  but developed insomnia - opted to discontinued given unlikely long term improvement. Self discontinued Wellb   Sleep apnea    Symptomatic mammary hypertrophy 08/15/2020   Urge urinary incontinence    Vitamin D  deficiency    Wears contact lenses     Surgical History: Past Surgical History:  Procedure Laterality Date   BREAST REDUCTION SURGERY  05/10/2011    Procedure: MAMMARY REDUCTION BILATERAL (BREAST);  Surgeon: Elna Browning Pick;  Location:  [facility];  Service: Plastics;  Laterality: Bilateral;  bilateral breast reduction   DILATION AND EVACUATION  12/26/2008   x 2   INTERSTIM IMPLANT PLACEMENT N/A 07/09/2014   Procedure: RENNA IMPLANT FIRST STAGE;  Surgeon: Glendia DELENA Elizabeth, MD;  Location: [facility];  Service: Urology;  Laterality: N/A;   INTERSTIM IMPLANT PLACEMENT N/A 07/09/2014   Procedure: RENNA IMPLANT SECOND STAGE;  Surgeon: Glendia DELENA Elizabeth, MD;  Location: [facility];  Service: Urology;  Laterality: N/A;   INTRAUTERINE DEVICE (IUD) INSERTION  sept 2011   mirena    WISDOM TOOTH EXTRACTION  as a teenager    Home Medications:  Allergies as of 06/18/2024       Reactions   Chlorhexidine Gluconate Itching   chlorhexidine gluconate   Geodon [ziprasidone Hydrochloride] Shortness Of Breath, Swelling   Hibiclens [chlorhexidine] Itching   Redness and itching after using CHG wipes   Sulfamethoxazole-trimethoprim Other (See Comments)   hallucinations   Doxycycline Other (See Comments)   Unknown reaction   Gabapentin Other (See Comments)   Unknown reaction   Other Other (See Comments)   Bath and body works body wash and lotion caused itching, reddness and rash   Penicillins Other (See Comments)   Unknown reaction Did it involve swelling of the face/tongue/throat, SOB, or low BP? Unknown Did it involve sudden or severe rash/hives, skin peeling, or any reaction on the inside of your mouth or nose? Unknown Did you need to seek medical attention at a hospital or doctor's office? Unknown When did it last happen?  unknown     If all above answers are NO, may proceed with cephalosporin use.   Promethazine  Other (See Comments)   Depakote [divalproex Sodium] Rash        Medication List        Accurate as of June 18, 2024 11:16 AM. If you have any questions, ask your nurse or  doctor.          ciclopirox  0.77 % cream Commonly known as: LOPROX  Apply topically at bedtime. qhs to feet and in between toes for at least 6 months, for tinea pedis   ciclopirox  8 % solution Commonly known as: PENLAC  Apply topically at bedtime. Apply over nail and surrounding skin. Apply daily over previous coat. After seven (7) days, may remove with alcohol and continue cycle.   ciprofloxacin  250 MG tablet Commonly known as: Cipro  Take 1 tablet (250 mg total) by mouth 2 (two) times daily for 7 days.   CombiPatch  0.05-0.14 MG/DAY  Generic drug: estradiol -norethindrone Place 1 patch onto the skin 2 (two) times a week.   hydrOXYzine 50 MG tablet Commonly known as: ATARAX 75 mg.   lamoTRIgine 200 MG tablet Commonly known as: LAMICTAL Take 400 mg by mouth daily.   metoCLOPramide  5 MG tablet Commonly known as: REGLAN  Take 1 tablet (5 mg total) by mouth every 8 (eight) hours as needed for nausea or vomiting.   oxybutynin  10 MG 24 hr tablet Commonly known as: DITROPAN -XL Take 1 tablet (10 mg total) by mouth daily.   rosuvastatin  20 MG tablet Commonly known as: Crestor  Take 1 tablet (20 mg total) by mouth daily.   temazepam 15 MG capsule Commonly known as: RESTORIL Take 3 capsules by mouth daily.        Allergies: Allergies[1]  Family History: Family History  Adopted: Yes  Problem Relation Age of Onset   Headache Neg Hx    Migraines Neg Hx     Social History:  reports that she quit smoking about 4 years ago. Her smoking use included cigarettes. She started smoking about 23 years ago. She has a 4.8 pack-year smoking history. She has been exposed to tobacco smoke. She has never used smokeless tobacco. She reports that she does not currently use alcohol. She reports that she does not use drugs.  ROS:                                        Physical Exam: BP 136/81 (BP Location: Right Arm, Patient Position: Sitting, Cuff Size: Normal)    Pulse 79   Ht 5' 4 (1.626 m)   LMP 07/31/2020   SpO2 100%   BMI 22.35 kg/m   Constitutional:  Alert and oriented, No acute distress.   Laboratory Data: Lab Results  Component Value Date   WBC 6.8 08/18/2023   HGB 13.6 08/18/2023   HCT 42.4 08/18/2023   MCV 85 08/18/2023   PLT 302 08/18/2023    Lab Results  Component Value Date   CREATININE 0.98 03/01/2024    No results found for: PSA  No results found for: TESTOSTERONE  Lab Results  Component Value Date   HGBA1C 5.3 02/14/2024    Urinalysis    Component Value Date/Time   COLORURINE AMBER (A) 08/05/2020 2051   APPEARANCEUR Clear 06/05/2024 1320   LABSPEC 1.027 08/05/2020 2051   PHURINE 6.0 08/05/2020 2051   GLUCOSEU Negative 06/05/2024 1320   HGBUR NEGATIVE 08/05/2020 2051   HGBUR negative 04/24/2009 1100   BILIRUBINUR Negative 06/05/2024 1320   KETONESUR negative 03/15/2024 1446   KETONESUR 5 (A) 08/05/2020 2051   PROTEINUR Negative 06/05/2024 1320   PROTEINUR 30 (A) 08/05/2020 2051   UROBILINOGEN 0.2 03/15/2024 1446   UROBILINOGEN 0.2 04/24/2009 1100   NITRITE Negative 06/05/2024 1320   NITRITE NEGATIVE 08/05/2020 2051   LEUKOCYTESUR Negative 06/05/2024 1320   LEUKOCYTESUR TRACE (A) 08/05/2020 2051    Pertinent Imaging:   Assessment & Plan: Follow as per protocol  1. Urinary frequency (Primary)  - botulinum toxin Type A  (BOTOX ) injection 100 Units  2. OAB (overactive bladder)  - botulinum toxin Type A  (BOTOX ) injection 100 Units - lidocaine  (XYLOCAINE ) 2 % jelly 1 Application   No follow-ups on file.  Glendia DELENA Elizabeth, MD  [facility] [address], [address] [place], [place] [address] [phone]     [1]  Allergies Allergen Reactions  Chlorhexidine Gluconate Itching    chlorhexidine gluconate   Geodon [Ziprasidone Hydrochloride] Shortness Of Breath and Swelling   Hibiclens [Chlorhexidine] Itching    Redness and itching after using CHG wipes    Sulfamethoxazole-Trimethoprim Other (See Comments)    hallucinations   Doxycycline Other (See Comments)    Unknown reaction   Gabapentin Other (See Comments)    Unknown reaction   Other Other (See Comments)    Bath and body works body wash and lotion caused itching, reddness and rash   Penicillins Other (See Comments)    Unknown reaction Did it involve swelling of the face/tongue/throat, SOB, or low BP? Unknown Did it involve sudden or severe rash/hives, skin peeling, or any reaction on the inside of your mouth or nose? Unknown Did you need to seek medical attention at a hospital or doctor's office? Unknown When did it last happen?  unknown     If all above answers are NO, may proceed with cephalosporin use.   Promethazine  Other (See Comments)   Depakote [Divalproex Sodium] Rash   "

## 2024-06-18 NOTE — Progress Notes (Signed)
 Bladder Instillation  Due to Over Active Bladder patient is present today for a Bladder Instillation of 2% Lidocaine . Patient was cleaned and prepped in a sterile fashion with betadine and lidocaine  2% jelly was instilled into the urethra.  A 14FR catheter was inserted, urine return was noted 1 ml, urine was yellow in color.  60 ml was instilled into the bladder. The catheter was then removed. Patient tolerated well, no complications were noted. Patient held in bladder for 30 minutes prior to procedure starting.   Performed by: Beauford Browner, CCMA  Follow up/ Additional notes: 2 week follow up with PA

## 2024-07-03 ENCOUNTER — Ambulatory Visit: Admitting: Obstetrics and Gynecology

## 2024-07-04 ENCOUNTER — Ambulatory Visit: Admitting: Physician Assistant

## 2024-07-10 ENCOUNTER — Ambulatory Visit: Payer: Self-pay | Admitting: Family Medicine

## 2024-07-11 ENCOUNTER — Ambulatory Visit: Admitting: Physician Assistant

## 2024-07-16 ENCOUNTER — Ambulatory Visit: Admitting: Obstetrics

## 2024-07-25 ENCOUNTER — Ambulatory Visit: Admitting: Physician Assistant

## 2025-01-21 ENCOUNTER — Encounter

## 2025-04-30 ENCOUNTER — Ambulatory Visit: Admitting: Dermatology
